# Patient Record
Sex: Female | Born: 1945 | Race: Black or African American | Hispanic: No | Marital: Single | Smoking: Former smoker
Health system: Southern US, Community
[De-identification: ages and names within clinical notes are randomized; demographics above are authoritative.]

## PROBLEM LIST (undated history)

## (undated) DIAGNOSIS — T4145XA Adverse effect of unspecified anesthetic, initial encounter: Secondary | ICD-10-CM

## (undated) DIAGNOSIS — Z9289 Personal history of other medical treatment: Secondary | ICD-10-CM

## (undated) DIAGNOSIS — I700 Atherosclerosis of aorta: Secondary | ICD-10-CM

## (undated) DIAGNOSIS — R0600 Dyspnea, unspecified: Secondary | ICD-10-CM

## (undated) DIAGNOSIS — E049 Nontoxic goiter, unspecified: Secondary | ICD-10-CM

## (undated) DIAGNOSIS — J302 Other seasonal allergic rhinitis: Secondary | ICD-10-CM

## (undated) DIAGNOSIS — I10 Essential (primary) hypertension: Secondary | ICD-10-CM

## (undated) DIAGNOSIS — K759 Inflammatory liver disease, unspecified: Secondary | ICD-10-CM

## (undated) DIAGNOSIS — R112 Nausea with vomiting, unspecified: Secondary | ICD-10-CM

## (undated) DIAGNOSIS — Z860100 Personal history of colon polyps, unspecified: Secondary | ICD-10-CM

## (undated) DIAGNOSIS — M1990 Unspecified osteoarthritis, unspecified site: Secondary | ICD-10-CM

## (undated) DIAGNOSIS — T7840XA Allergy, unspecified, initial encounter: Secondary | ICD-10-CM

## (undated) DIAGNOSIS — E785 Hyperlipidemia, unspecified: Secondary | ICD-10-CM

## (undated) DIAGNOSIS — R011 Cardiac murmur, unspecified: Secondary | ICD-10-CM

## (undated) DIAGNOSIS — K219 Gastro-esophageal reflux disease without esophagitis: Secondary | ICD-10-CM

## (undated) DIAGNOSIS — R002 Palpitations: Secondary | ICD-10-CM

## (undated) DIAGNOSIS — T8859XA Other complications of anesthesia, initial encounter: Secondary | ICD-10-CM

## (undated) DIAGNOSIS — N189 Chronic kidney disease, unspecified: Secondary | ICD-10-CM

## (undated) DIAGNOSIS — Z9889 Other specified postprocedural states: Secondary | ICD-10-CM

## (undated) DIAGNOSIS — K859 Acute pancreatitis without necrosis or infection, unspecified: Secondary | ICD-10-CM

## (undated) DIAGNOSIS — J439 Emphysema, unspecified: Secondary | ICD-10-CM

## (undated) DIAGNOSIS — I4891 Unspecified atrial fibrillation: Secondary | ICD-10-CM

## (undated) DIAGNOSIS — D219 Benign neoplasm of connective and other soft tissue, unspecified: Secondary | ICD-10-CM

## (undated) HISTORY — PX: CARPAL TUNNEL RELEASE: SHX101

## (undated) HISTORY — DX: Gastro-esophageal reflux disease without esophagitis: K21.9

## (undated) HISTORY — DX: Allergy, unspecified, initial encounter: T78.40XA

## (undated) HISTORY — PX: HAND SURGERY: SHX662

## (undated) HISTORY — DX: Personal history of colon polyps, unspecified: Z86.0100

## (undated) HISTORY — PX: TONSILLECTOMY: SHX5217

## (undated) HISTORY — PX: CHOLECYSTECTOMY: SHX55

## (undated) HISTORY — DX: Acute pancreatitis without necrosis or infection, unspecified: K85.90

## (undated) HISTORY — PX: TONSILLECTOMY: SUR1361

## (undated) HISTORY — DX: Hyperlipidemia, unspecified: E78.5

## (undated) HISTORY — PX: COLONOSCOPY W/ POLYPECTOMY: SHX1380

## (undated) HISTORY — DX: Essential (primary) hypertension: I10

## (undated) HISTORY — DX: Personal history of colonic polyps: Z86.010

---

## 1975-06-12 HISTORY — PX: ABDOMINAL HYSTERECTOMY: SHX81

## 2004-08-09 ENCOUNTER — Emergency Department: Payer: Self-pay | Admitting: Unknown Physician Specialty

## 2007-03-07 ENCOUNTER — Encounter: Payer: Self-pay | Admitting: Family Medicine

## 2007-03-26 LAB — HM PAP SMEAR

## 2007-03-26 LAB — CONVERTED CEMR LAB: Pap Smear: NORMAL

## 2008-03-23 ENCOUNTER — Encounter: Payer: Self-pay | Admitting: Family Medicine

## 2009-04-22 ENCOUNTER — Ambulatory Visit: Payer: Self-pay | Admitting: Family Medicine

## 2009-04-22 DIAGNOSIS — E1129 Type 2 diabetes mellitus with other diabetic kidney complication: Secondary | ICD-10-CM | POA: Insufficient documentation

## 2009-04-22 DIAGNOSIS — K219 Gastro-esophageal reflux disease without esophagitis: Secondary | ICD-10-CM | POA: Insufficient documentation

## 2009-04-22 DIAGNOSIS — J309 Allergic rhinitis, unspecified: Secondary | ICD-10-CM | POA: Insufficient documentation

## 2009-04-22 DIAGNOSIS — E785 Hyperlipidemia, unspecified: Secondary | ICD-10-CM | POA: Insufficient documentation

## 2009-04-22 DIAGNOSIS — I10 Essential (primary) hypertension: Secondary | ICD-10-CM | POA: Insufficient documentation

## 2009-04-22 DIAGNOSIS — Z87898 Personal history of other specified conditions: Secondary | ICD-10-CM | POA: Insufficient documentation

## 2009-04-25 LAB — CONVERTED CEMR LAB
ALT: 30 units/L (ref 0–35)
AST: 27 units/L (ref 0–37)
Albumin: 4.1 g/dL (ref 3.5–5.2)
Alkaline Phosphatase: 95 units/L (ref 39–117)
BUN: 15 mg/dL (ref 6–23)
Bilirubin, Direct: 0 mg/dL (ref 0.0–0.3)
CO2: 27 meq/L (ref 19–32)
Calcium: 9.3 mg/dL (ref 8.4–10.5)
Chloride: 105 meq/L (ref 96–112)
Cholesterol: 214 mg/dL — ABNORMAL HIGH (ref 0–200)
Creatinine, Ser: 1.1 mg/dL (ref 0.4–1.2)
Direct LDL: 142.9 mg/dL
GFR calc non Af Amer: 64.83 mL/min (ref >60)
Glucose, Bld: 218 mg/dL — ABNORMAL HIGH (ref 70–99)
HDL: 56.8 mg/dL (ref >39.00)
Hgb A1c MFr Bld: 8.9 % — ABNORMAL HIGH (ref 4.6–6.5)
Potassium: 4.1 meq/L (ref 3.5–5.1)
Sodium: 141 meq/L (ref 135–145)
Total Bilirubin: 0.8 mg/dL (ref 0.3–1.2)
Total CHOL/HDL Ratio: 4
Total Protein: 7.1 g/dL (ref 6.0–8.3)
Triglycerides: 88 mg/dL (ref 0.0–149.0)
VLDL: 17.6 mg/dL (ref 0.0–40.0)

## 2009-06-16 ENCOUNTER — Encounter: Payer: Self-pay | Admitting: Family Medicine

## 2009-07-01 ENCOUNTER — Telehealth: Payer: Self-pay | Admitting: Family Medicine

## 2009-07-02 ENCOUNTER — Ambulatory Visit: Payer: Self-pay | Admitting: Family Medicine

## 2009-07-02 DIAGNOSIS — J019 Acute sinusitis, unspecified: Secondary | ICD-10-CM | POA: Insufficient documentation

## 2009-07-09 ENCOUNTER — Emergency Department (HOSPITAL_COMMUNITY)
Admission: EM | Admit: 2009-07-09 | Discharge: 2009-07-09 | Payer: Self-pay | Source: Home / Self Care | Admitting: Family Medicine

## 2009-07-26 ENCOUNTER — Ambulatory Visit: Payer: Self-pay | Admitting: Family Medicine

## 2009-07-27 LAB — CONVERTED CEMR LAB
Cholesterol: 210 mg/dL — ABNORMAL HIGH (ref 0–200)
Direct LDL: 115.7 mg/dL
HDL: 68.7 mg/dL (ref >39.00)
Hgb A1c MFr Bld: 8.3 % — ABNORMAL HIGH (ref 4.6–6.5)
Total CHOL/HDL Ratio: 3
Triglycerides: 153 mg/dL — ABNORMAL HIGH (ref 0.0–149.0)
VLDL: 30.6 mg/dL (ref 0.0–40.0)

## 2009-07-29 ENCOUNTER — Ambulatory Visit: Payer: Self-pay | Admitting: Family Medicine

## 2009-07-30 ENCOUNTER — Encounter: Payer: Self-pay | Admitting: Family Medicine

## 2009-08-17 ENCOUNTER — Telehealth: Payer: Self-pay | Admitting: Family Medicine

## 2009-09-08 ENCOUNTER — Telehealth: Payer: Self-pay | Admitting: Family Medicine

## 2009-09-13 ENCOUNTER — Ambulatory Visit: Payer: Self-pay | Admitting: Family Medicine

## 2009-09-14 LAB — CONVERTED CEMR LAB: Hgb A1c MFr Bld: 8.1 % — ABNORMAL HIGH (ref 4.6–6.5)

## 2009-09-27 ENCOUNTER — Ambulatory Visit: Payer: Self-pay | Admitting: Family Medicine

## 2009-10-04 ENCOUNTER — Telehealth: Payer: Self-pay | Admitting: Family Medicine

## 2009-11-18 ENCOUNTER — Telehealth: Payer: Self-pay | Admitting: Family Medicine

## 2009-12-15 ENCOUNTER — Ambulatory Visit: Payer: Self-pay | Admitting: Family Medicine

## 2009-12-16 LAB — CONVERTED CEMR LAB: Hgb A1c MFr Bld: 7.8 % — ABNORMAL HIGH (ref 4.6–6.5)

## 2010-02-09 ENCOUNTER — Ambulatory Visit: Payer: Self-pay | Admitting: Family Medicine

## 2010-02-09 DIAGNOSIS — J020 Streptococcal pharyngitis: Secondary | ICD-10-CM | POA: Insufficient documentation

## 2010-02-22 ENCOUNTER — Ambulatory Visit: Payer: Self-pay | Admitting: Family Medicine

## 2010-02-22 DIAGNOSIS — J069 Acute upper respiratory infection, unspecified: Secondary | ICD-10-CM | POA: Insufficient documentation

## 2010-03-16 ENCOUNTER — Ambulatory Visit: Payer: Self-pay | Admitting: Family Medicine

## 2010-03-22 ENCOUNTER — Ambulatory Visit: Payer: Self-pay | Admitting: Family Medicine

## 2010-06-29 ENCOUNTER — Encounter: Payer: Self-pay | Admitting: Family Medicine

## 2010-06-29 ENCOUNTER — Other Ambulatory Visit: Payer: Self-pay | Admitting: Family Medicine

## 2010-06-29 ENCOUNTER — Ambulatory Visit
Admission: RE | Admit: 2010-06-29 | Discharge: 2010-06-29 | Payer: Self-pay | Source: Home / Self Care | Attending: Family Medicine | Admitting: Family Medicine

## 2010-06-29 DIAGNOSIS — R1013 Epigastric pain: Secondary | ICD-10-CM | POA: Insufficient documentation

## 2010-06-29 LAB — BASIC METABOLIC PANEL
BUN: 15 mg/dL (ref 6–23)
CO2: 30 mEq/L (ref 19–32)
Calcium: 9.1 mg/dL (ref 8.4–10.5)
Chloride: 99 mEq/L (ref 96–112)
Creatinine, Ser: 1.3 mg/dL — ABNORMAL HIGH (ref 0.4–1.2)
GFR: 52.78 mL/min — ABNORMAL LOW (ref >60.00)
Glucose, Bld: 238 mg/dL — ABNORMAL HIGH (ref 70–99)
Potassium: 4.7 mEq/L (ref 3.5–5.1)
Sodium: 135 mEq/L (ref 135–145)

## 2010-06-29 LAB — LIPASE: Lipase: 26 U/L (ref 11.0–59.0)

## 2010-06-29 LAB — HEPATIC FUNCTION PANEL
ALT: 31 U/L (ref 0–35)
AST: 27 U/L (ref 0–37)
Albumin: 3.9 g/dL (ref 3.5–5.2)
Alkaline Phosphatase: 100 U/L (ref 39–117)
Bilirubin, Direct: 0.1 mg/dL (ref 0.0–0.3)
Total Bilirubin: 0.4 mg/dL (ref 0.3–1.2)
Total Protein: 6.8 g/dL (ref 6.0–8.3)

## 2010-07-04 ENCOUNTER — Telehealth: Payer: Self-pay | Admitting: Family Medicine

## 2010-07-05 ENCOUNTER — Ambulatory Visit: Admit: 2010-07-05 | Payer: Self-pay | Admitting: Family Medicine

## 2010-07-11 NOTE — Progress Notes (Signed)
Summary: Cough, congestion, sore throat  Phone Note Call from Patient   Caller: Patient Call For: Ruthe Mannan MD Summary of Call: Patient called stating that she is congested, coughing badly, hoarse, sore throat, having hot and cold chills.  Feels like the cold is moving down into her lungs.  Wants to be seen if possible.  Your schedule is booked.  Please advise Initial call taken by: Linde Gillis CMA Duncan Dull),  July 01, 2009 10:41 AM  Follow-up for Phone Call        I honestly cannot handle any more patients today.  It looks as if Dr. Milinda Antis has availablity. If she cannot see her, please schedule in Saturday clinic. Follow-up by: Ruthe Mannan MD,  July 01, 2009 10:59 AM     Appended Document: Cough, congestion, sore throat Patient agrees to be seen in Saturday Clinic tomorrow.  Advised her to call back this afternoon after 2:00 to be scheduled for tomorrow's clinic.  I will call her back to make sure she was able to get appt.    Appended Document: Cough, congestion, sore throat Called to make sure that patient scheduled an appointment for Saturday clinic and she was on the other line speaking with one of our receptionist to schedule the appointment.

## 2010-07-11 NOTE — Assessment & Plan Note (Signed)
Summary: 2WK FOLLOW UP / LFW   Vital Signs:  Patient profile:   65 year old female Height:      63 inches Weight:      131.50 pounds BMI:     23.38 Temp:     98.6 degrees F oral Pulse rate:   64 / minute Pulse rhythm:   regular BP sitting:   122 / 72  (left arm) Cuff size:   regular  Vitals Entered By: Lewanda Rife LPN (September 27, 2009 9:49 AM) CC: two week follow up   History of Present Illness: 65 yo here to for f/u DM.    DM- in February, hga1c was 8.3, this month 8.1.  At that time, had been on Januvia 100 mg daily and Glipeperide 8 mg daily for about a year.  Had also been taking Ultrace prior to every meal.  We then added Byetta 5 micrograms  bid in February.  Since that time, she had been very happy with byetta, however she is now having multiple episodesof hypoglyemia, ranging in the 50s.  She did feel nauseated and diaphoretic with those episodes but has stopped taking the Byetta.  Metformin causes severe GI upset.  CBGs without Byetta still in high 250s/260s.     Current Medications (verified): 1)  Fergon 240 (27 Fe) Mg Tabs (Ferrous Gluconate) .... Take 1 Tablet By Mouth Once A Day 2)  Fexofenadine Hcl 180 Mg Tabs (Fexofenadine Hcl) .... Take 1 Tablet By Mouth Once A Day As Needed 3)  Ranitidine Hcl 300 Mg Caps (Ranitidine Hcl) .... Take 1 Capsule By Mouth Two Times A Day 4)  Lisinopril 5 Mg Tabs (Lisinopril) .... Take 1 Tablet By Mouth Once A Day 5)  Glimepiride 4 Mg Tabs (Glimepiride) .... Take 2 Tablets By Mouth Once Daily 6)  Januvia 100 Mg Tabs (Sitagliptin Phosphate) .... Take 1 Tablet By Mouth Once A Day 7)  Fluticasone Propionate 50 Mcg/act Susp (Fluticasone Propionate) .... Two Sprays Each Nostril Once Daily 8)  Multivitamins   Tabs (Multiple Vitamin) .... Take 1 Tablet By Mouth Once A Day 9)  Calcium Carbonate-Vitamin D 600-400 Mg-Unit  Tabs (Calcium Carbonate-Vitamin D) .... Take 1 Tablet By Mouth Once A Day 10)  Pancreaze 4200 Unit Cpep (Pancrelipase  (Lip-Prot-Amyl)) .Marland Kitchen.. 1 Tab Before Every Meal. 11)  One Touch Test Strp (Glucose Blood) .... Use As Directed 12)  Ulticare Mini Pen Needles 31g X 6 Mm Misc (Insulin Pen Needle) .... Use As Directed. 13)  Lantus Solostar 100 Unit/ml Soln (Insulin Glargine) .... Lantus Pen- 5 Units At Bedtime. Send Overnight.  Allergies: 1)  ! Codeine  Review of Systems      See HPI CV:  Denies chest pain or discomfort. Resp:  Denies shortness of breath.  Physical Exam  General:  Well-developed,well-nourished,in no acute distress; alert,appropriate and cooperative throughout examination Mouth:  MMM Psych:  Oriented X3 and normally interactive.    Diabetes Management Exam:    Foot Exam (with socks and/or shoes not present):       Sensory-Pinprick/Light touch:          Left medial foot (L-4): normal          Left dorsal foot (L-5): normal          Left lateral foot (S-1): normal          Right medial foot (L-4): normal       Sensory-Monofilament:          Left foot: normal  Right foot: normal       Inspection:          Left foot: normal          Right foot: normal       Nails:          Left foot: thickened          Right foot: thickened   Impression & Recommendations:  Problem # 1:  DIABETES MELLITUS, TYPE II (ICD-250.00) Assessment Unchanged Time spent with patient 25 minutes, more than 50% of this time was spent counseling patient on diabetes management. Advised not restarting Byetta. At this point, we really do need to start insulin.  Pt agrees with plan. Given that she had so many episodes of hypoglyecima, we will start slow with 5 units of Lantus at bedtime along with her other meds.  She will call me next week with her CBGs (3 times daily), as we will definitely have to titrate up. a1c in 3 months, appt already scheduled.  The following medications were removed from the medication list:    Byetta 5 Mcg Pen 5 Mcg/0.33ml Soln (Exenatide) .Marland KitchenMarland KitchenMarland KitchenMarland Kitchen 5 micrograms two times a day 60  minutes prior to meal. Her updated medication list for this problem includes:    Lisinopril 5 Mg Tabs (Lisinopril) .Marland Kitchen... Take 1 tablet by mouth once a day    Glimepiride 4 Mg Tabs (Glimepiride) .Marland Kitchen... Take 2 tablets by mouth once daily    Januvia 100 Mg Tabs (Sitagliptin phosphate) .Marland Kitchen... Take 1 tablet by mouth once a day    Lantus Solostar 100 Unit/ml Soln (Insulin glargine) .Marland Kitchen... Lantus pen- 5 units at bedtime. send overnight.  Complete Medication List: 1)  Fergon 240 (27 Fe) Mg Tabs (Ferrous gluconate) .... Take 1 tablet by mouth once a day 2)  Fexofenadine Hcl 180 Mg Tabs (Fexofenadine hcl) .... Take 1 tablet by mouth once a day as needed 3)  Ranitidine Hcl 300 Mg Caps (Ranitidine hcl) .... Take 1 capsule by mouth two times a day 4)  Lisinopril 5 Mg Tabs (Lisinopril) .... Take 1 tablet by mouth once a day 5)  Glimepiride 4 Mg Tabs (Glimepiride) .... Take 2 tablets by mouth once daily 6)  Januvia 100 Mg Tabs (Sitagliptin phosphate) .... Take 1 tablet by mouth once a day 7)  Fluticasone Propionate 50 Mcg/act Susp (Fluticasone propionate) .... Two sprays each nostril once daily 8)  Multivitamins Tabs (Multiple vitamin) .... Take 1 tablet by mouth once a day 9)  Calcium Carbonate-vitamin D 600-400 Mg-unit Tabs (Calcium carbonate-vitamin d) .... Take 1 tablet by mouth once a day 10)  Pancreaze 4200 Unit Cpep (Pancrelipase (lip-prot-amyl)) .Marland Kitchen.. 1 tab before every meal. 11)  One Touch Test Strp (Glucose blood) .... Use as directed 12)  Ulticare Mini Pen Needles 31g X 6 Mm Misc (Insulin pen needle) .... Use as directed. 13)  Lantus Solostar 100 Unit/ml Soln (Insulin glargine) .... Lantus pen- 5 units at bedtime. send overnight.  Patient Instructions: 1)  Stop Byetta. 2)  Start Lantus 5 units every night. 3)  Check sugars 3 times a day, call me next week. Prescriptions: LANTUS SOLOSTAR 100 UNIT/ML SOLN (INSULIN GLARGINE) lantus pen- 5 units at bedtime. Send overnight.  #1610 x 3   Entered and  Authorized by:   Ruthe Mannan MD   Signed by:   Ruthe Mannan MD on 09/27/2009   Method used:   Faxed to ...       [facility] Delivery Fax) Environmental education officer)             ,  Ph: [phone]       Fax: [phone]   RxID:   [id number]   Current Allergies (reviewed today): ! CODEINE

## 2010-07-11 NOTE — Progress Notes (Signed)
Summary: regarding lantus  Phone Note Call from Patient   Caller: Patient Call For: Ruthe Mannan MD Summary of Call: [facility] says [facility] didnt get script for lantus.  She is asking that we resend this. Initial call taken by: Lowella Petties CMA,  October 04, 2009 3:44 PM    Prescriptions: LANTUS SOLOSTAR 100 UNIT/ML SOLN (INSULIN GLARGINE) lantus pen- 5 units at bedtime. Send overnight.  #1610 x 3   Entered and Authorized by:   Ruthe Mannan MD   Signed by:   Ruthe Mannan MD on 10/05/2009   Method used:   Faxed to ...       [facility] Delivery Fax) (mail-order)             ,          Ph: [phone]       Fax: [phone]   RxID:   [id number]

## 2010-07-11 NOTE — Assessment & Plan Note (Signed)
Summary: SORE THROAT   Vital Signs:  Patient profile:   65 year old female Height:      63 inches Weight:      132.25 pounds BMI:     23.51 Temp:     98.4 degrees F oral Pulse rate:   84 / minute Pulse rhythm:   regular BP sitting:   138 / 70  (left arm) Cuff size:   regular  Vitals Entered By: Delilah Shan CMA Duncan Dull) (February 09, 2010 2:04 PM) CC: Sore Throat   History of Present Illness: Ear pain and ST for a few days.  Sweaty, hot, chills.  Voice change noted by patient.  Occ myalgias in arms and legs.  No vomiting.  Able to swallow.  Dec in appetite.  Sister was sick.  Sugar has been controlled.    Allergies: 1)  ! Codeine 2)  ! Penicillin  Review of Systems       See HPI.  Otherwise negative.    Physical Exam  General:  GEN: nad, alert and oriented HEENT: mucous membranes moist, TM w/o erythema, nasal epithelium injected, OP with cobblestoning NECK: supple with shotty LA CV: rrr. PULM: ctab, no inc wob ABD: soft, +bs EXT: no edema    Impression & Recommendations:  Problem # 1:  STREPTOCOCCAL PHARYNGITIS (ICD-034.0) RST pos.  Tx and supportive measures o/w.  D/w patient and she understands.  follow up as needed.  Her updated medication list for this problem includes:    Zithromax 250 Mg Tabs (Azithromycin) .Marland Kitchen... 2 by mouth x1 day then 1 by mouth x4 days.  Complete Medication List: 1)  Fergon 240 (27 Fe) Mg Tabs (Ferrous gluconate) .... Take 1 tablet by mouth once a day 2)  Fexofenadine Hcl 180 Mg Tabs (Fexofenadine hcl) .... Take 1 tablet by mouth once a day as needed 3)  Ranitidine Hcl 300 Mg Caps (Ranitidine hcl) .... Take 1 capsule by mouth two times a day 4)  Lisinopril 5 Mg Tabs (Lisinopril) .... Take 1 tablet by mouth once a day 5)  Glimepiride 4 Mg Tabs (Glimepiride) .... Take 2 tablets by mouth once daily 6)  Januvia 100 Mg Tabs (Sitagliptin phosphate) .... Take 1 tablet by mouth once a day 7)  Fluticasone Propionate 50 Mcg/act Susp (Fluticasone  propionate) .... Two sprays each nostril once daily 8)  Multivitamins Tabs (Multiple vitamin) .... Take 1 tablet by mouth once a day 9)  Calcium Carbonate-vitamin D 600-400 Mg-unit Tabs (Calcium carbonate-vitamin d) .... Take 1 tablet by mouth once a day 10)  Pancreaze 4200 Unit Cpep (Pancrelipase (lip-prot-amyl)) .Marland Kitchen.. 1 tab before every meal. 11)  One Touch Test Strp (Glucose blood) .... Use as directed 12)  Ulticare Mini Pen Needles 31g X 6 Mm Misc (Insulin pen needle) .... Use as directed. 13)  Lantus Solostar 100 Unit/ml Soln (Insulin glargine) .... Lantus pen- 5 units at bedtime. send overnight. 14)  Zithromax 250 Mg Tabs (Azithromycin) .... 2 by mouth x1 day then 1 by mouth x4 days.  Patient Instructions: 1)  Try to rest, gargle with salt water, and take the antibiotics.  Let us know if you aren't feeling better.  Prescriptions: ZITHROMAX 250 MG TABS (AZITHROMYCIN) 2 by mouth x1 day then 1 by mouth x4 days.  #6 x 0   Entered and Authorized by:   Crawford Givens MD   Signed by:   Crawford Givens MD on 02/09/2010   Method used:   Electronically to  [facility] [address] (retail)       [address], [address]       [place], [place]  [address]       Ph: [phone]       Fax: [phone]   RxID:   [id number]   Current Allergies (reviewed today): ! CODEINE ! PENICILLIN  Laboratory Results  Date/Time Received: February 09, 2010 2:18 PM   Other Tests  Rapid Strep: positive

## 2010-07-11 NOTE — Progress Notes (Signed)
Summary: need 90 day script for pancreaze  Phone Note Refill Request Message from:  Patient  Refills Requested: Medication #1:  PANCREAZE 4200 UNIT CPEP 1 tab before every meal. Pt needs 90 day written script [facility].  She will pick this up when ready.  Initial call taken by: Lowella Petties CMA,  September 08, 2009 3:11 PM  Follow-up for Phone Call        on my desk. Follow-up by: Ruthe Mannan MD,  September 08, 2009 3:17 PM    Prescriptions: PANCREAZE 4200 UNIT CPEP (PANCRELIPASE (LIP-PROT-AMYL)) 1 tab before every meal.  #90 x 0   Entered and Authorized by:   Ruthe Mannan MD   Signed by:   Ruthe Mannan MD on 09/08/2009   Method used:   Print then Give to Patient   RxID:   [id number]   Appended Document: need 90 day script for pancreaze Patient Advised.

## 2010-07-11 NOTE — Progress Notes (Signed)
Summary: pt requests phone call  Phone Note Call from Patient Call back at Home Phone [phone] Call back at [phone]   Caller: Patient Call For: Ruthe Mannan MD Summary of Call: Pt says she was told to call and leave a message and you would call her back regarding her blood sugars.  She says these are a little high in the mornings.  She didnt have the numbers ready to give me when she called, but said she would write them down so that she can give them to you. Initial call taken by: Lowella Petties CMA,  November 18, 2009 2:23 PM  Follow-up for Phone Call        Called pt.  CBG running 150s-170s in morning.  Advised to increase Lantus to 10 units nightly. Ruthe Mannan MD  November 18, 2009 3:04 PM

## 2010-07-11 NOTE — Progress Notes (Signed)
Summary: needs new scripts  Phone Note Call from Patient   Caller: Patient Call For: Audrey Mannan MD Summary of Call: Pt states she needs something to replace ultrace, she says this is not FDA approved.  She wants a 30 day script called to [facility] as well as a 90 day script [facility]  She also wants 90 day scripts for 31 guage 6 mm needles and one touch ultra test strips, with directions on how often to check blood sugar.  [facility]. Initial call taken by: Lowella Petties CMA,  August 17, 2009 4:13 PM    New/Updated Medications: PANCREAZE 4200 UNIT CPEP (PANCRELIPASE (LIP-PROT-AMYL)) 1 tab before every meal. ONE TOUCH TEST STRP (GLUCOSE BLOOD) use as directed ULTICARE MINI PEN NEEDLES 31G X 6 MM MISC (INSULIN PEN NEEDLE) Use as directed. Prescriptions: ULTICARE MINI PEN NEEDLES 31G X 6 MM MISC (INSULIN PEN NEEDLE) Use as directed.  #270 x 3   Entered and Authorized by:   Audrey Mannan MD   Signed by:   Audrey Mannan MD on 08/17/2009   Method used:   Faxed to ...       [facility] Delivery Fax) (mail-order)             ,          Ph: [phone]       Fax: [phone]   RxID:   [id number] ONE TOUCH TEST STRP (GLUCOSE BLOOD) use as directed  #270 x 3   Entered and Authorized by:   Audrey Mannan MD   Signed by:   Audrey Mannan MD on 08/17/2009   Method used:   Faxed to ...       [facility] Delivery Fax) (mail-order)             ,          Ph: [phone]       Fax: [phone]   RxID:   [id number] PANCREAZE 4200 UNIT CPEP (PANCRELIPASE (LIP-PROT-AMYL)) 1 tab before every meal.  #30 x 0   Entered and Authorized by:   Audrey Mannan MD   Signed by:   Audrey Mannan MD on 08/17/2009   Method used:   [facility] to        [facility]  [address] (retail)       [address]       [place], [place]  [address]       Ph: [phone] or [phone]       Fax: [phone]   RxID:   [id number]

## 2010-07-11 NOTE — Assessment & Plan Note (Signed)
Summary: ?SINUS INFECTION/CLE   Vital Signs:  Patient profile:   65 year old female Height:      63 inches Weight:      130 pounds BMI:     23.11 Temp:     99.0 degrees F oral Pulse rate:   68 / minute Pulse rhythm:   regular BP sitting:   120 / 60  (left arm) Cuff size:   regular  Vitals Entered By: Linde Gillis CMA Duncan Dull) (February 22, 2010 10:57 AM) CC: cough, congestion   History of Present Illness: 65 yo here for worsening cough and congestion.  Seen by Dr. Para March on 02/09/10, rapid strep positive. Treated with Zpack since she has PCN allergy. Felt a little better, sore throat has resolved, ear pain has resolved but now has a lot of congestion, productive cough, feels feverish. No CP or SOB. No wheezing.  Current Medications (verified): 1)  Fexofenadine Hcl 180 Mg Tabs (Fexofenadine Hcl) .... Take 1 Tablet By Mouth Once A Day As Needed 2)  Ranitidine Hcl 300 Mg Caps (Ranitidine Hcl) .... Take 1 Capsule By Mouth Two Times A Day 3)  Lisinopril 5 Mg Tabs (Lisinopril) .... Take 1 Tablet By Mouth Once A Day 4)  Glimepiride 4 Mg Tabs (Glimepiride) .... Take 2 Tablets By Mouth Once Daily 5)  Januvia 100 Mg Tabs (Sitagliptin Phosphate) .... Take 1 Tablet By Mouth Once A Day 6)  Fluticasone Propionate 50 Mcg/act Susp (Fluticasone Propionate) .... Two Sprays Each Nostril Once Daily 7)  Multivitamins   Tabs (Multiple Vitamin) .... Take 1 Tablet By Mouth Once A Day 8)  Calcium Carbonate-Vitamin D 600-400 Mg-Unit  Tabs (Calcium Carbonate-Vitamin D) .... Take 1 Tablet By Mouth Once A Day 9)  Pancreaze 4200 Unit Cpep (Pancrelipase (Lip-Prot-Amyl)) .Marland Kitchen.. 1 Tab Before Every Meal. 10)  One Touch Test Strp (Glucose Blood) .... Use As Directed 11)  Ulticare Mini Pen Needles 31g X 6 Mm Misc (Insulin Pen Needle) .... Use As Directed. 12)  Lantus Solostar 100 Unit/ml Soln (Insulin Glargine) .... Lantus Pen- 5 Units At Bedtime. Send Overnight. 13)  Azithromycin 250 Mg  Tabs (Azithromycin) .... 2  By  Mouth Today and Then 1 Daily For 4 Days  Allergies: 1)  ! Codeine 2)  ! Penicillin  Past History:  Past Medical History: Last updated: 04/22/2009 Allergic rhinitis Diabetes mellitus, type II GERD Hyperlipidemia Hypertension  Past Surgical History: Last updated: 04/22/2009 Hysterectomy 1976 for fibroids Cholecystectomy 2008 Tonsillectomy 1954  Family History: Last updated: 04/22/2009 Mom - alzheimers, h/o breast CA in 58s, DM Dad- died of massive MI at 32  Social History: Last updated: 04/22/2009 Recently moved here from [place] to take care of mother with Alzheimers.  Has two sisters who live here, not helping care for mom.  Quit smoking 1 year ago with Chantix.  Retired from [employer]. Alcohol use-no Drug use-no Regular exercise-no  Risk Factors: Exercise: no (04/22/2009)  Review of Systems      See HPI General:  Complains of fever. ENT:  Complains of nasal congestion and sinus pressure; denies earache. CV:  Denies chest pain or discomfort. Resp:  Complains of cough and sputum productive.  Physical Exam  General:  GEN: nad, alert and oriented HEENT: mucous membranes moist, TM w/o erythema, nasal epithelium injected, OP with cobblestoning NECK: supple with shotty LA CV: rrr. PULM: ctab, no inc wob ABD: soft, +bs EXT: no edema    Impression & Recommendations:  Problem # 1:  STREPTOCOCCAL PHARYNGITIS (  ICD-034.0) Assessment Improved s/p Zpack, now with URI symptoms,  Not taking anything OTC. The following medications were removed from the medication list:    Zithromax 250 Mg Tabs (Azithromycin) .Marland Kitchen... 2 by mouth x1 day then 1 by mouth x4 days. Her updated medication list for this problem includes:    Azithromycin 250 Mg Tabs (Azithromycin) .Marland Kitchen... 2 by  mouth today and then 1 daily for 4 days  Problem # 2:  URI (ICD-465.9) Assessment: New Will repeat Zpack, supportive care as per pt instructions. Her updated medication  list for this problem includes:    Fexofenadine Hcl 180 Mg Tabs (Fexofenadine hcl) .Marland Kitchen... Take 1 tablet by mouth once a day as needed  Complete Medication List: 1)  Fexofenadine Hcl 180 Mg Tabs (Fexofenadine hcl) .... Take 1 tablet by mouth once a day as needed 2)  Ranitidine Hcl 300 Mg Caps (Ranitidine hcl) .... Take 1 capsule by mouth two times a day 3)  Lisinopril 5 Mg Tabs (Lisinopril) .... Take 1 tablet by mouth once a day 4)  Glimepiride 4 Mg Tabs (Glimepiride) .... Take 2 tablets by mouth once daily 5)  Januvia 100 Mg Tabs (Sitagliptin phosphate) .... Take 1 tablet by mouth once a day 6)  Fluticasone Propionate 50 Mcg/act Susp (Fluticasone propionate) .... Two sprays each nostril once daily 7)  Multivitamins Tabs (Multiple vitamin) .... Take 1 tablet by mouth once a day 8)  Calcium Carbonate-vitamin D 600-400 Mg-unit Tabs (Calcium carbonate-vitamin d) .... Take 1 tablet by mouth once a day 9)  Pancreaze 4200 Unit Cpep (Pancrelipase (lip-prot-amyl)) .Marland Kitchen.. 1 tab before every meal. 10)  One Touch Test Strp (Glucose blood) .... Use as directed 11)  Ulticare Mini Pen Needles 31g X 6 Mm Misc (Insulin pen needle) .... Use as directed. 12)  Lantus Solostar 100 Unit/ml Soln (Insulin glargine) .... Lantus pen- 5 units at bedtime. send overnight. 13)  Azithromycin 250 Mg Tabs (Azithromycin) .... 2 by  mouth today and then 1 daily for 4 days  Patient Instructions: 1)  Take antibiotic as directed.  Drink lots of fluids.  Treat sympotmatically with Mucinex, nasal saline irrigation, and Tylenol/Ibuprofen.  Prescriptions: AZITHROMYCIN 250 MG  TABS (AZITHROMYCIN) 2 by  mouth today and then 1 daily for 4 days  #6 x 0   Entered and Authorized by:   Ruthe Mannan MD   Signed by:   Ruthe Mannan MD on 02/22/2010   Method used:   Electronically to        [facility] (retail)       [address]       [place], [place]  [address]       Ph: [phone]       Fax:  [phone]   RxID:   [id number]   Current Allergies (reviewed today): ! CODEINE ! PENICILLIN

## 2010-07-11 NOTE — Assessment & Plan Note (Signed)
Summary: f/u after labs/alc   Vital Signs:  Patient profile:   65 year old female Height:      63 inches Weight:      134 pounds BMI:     23.82 Temp:     98.4 degrees F oral Pulse rate:   72 / minute Pulse rhythm:   regular BP sitting:   130 / 72  (left arm) Cuff size:   regular  Vitals Entered By: Linde Gillis CMA [facility]) (March 22, 2010 9:14 AM) CC: follow-up visit after labs   History of Present Illness: 65 yo here for follow up DM.   DM- in February, hga1c was 8.3, now persistently 7.8.  Admits to not checking CBGs regularly or taking her medication regularly since her mom has been in and out of the hopsital and Ms. Marticia is her primary caregiver.   Does not feel like her sugars have been too low. Currently on Glimepiride 4 mg two times a day, Januvia 100 mg daily, Lantus 5 units at bedtime.    Throat feels better, last seen and treated for strep pharyngitis.  Current Medications (verified): 1)  Fexofenadine Hcl 180 Mg Tabs (Fexofenadine Hcl) .... Take 1 Tablet By Mouth Once A Day As Needed 2)  Ranitidine Hcl 300 Mg Caps (Ranitidine Hcl) .... Take 1 Capsule By Mouth Two Times A Day 3)  Lisinopril 5 Mg Tabs (Lisinopril) .... Take 1 Tablet By Mouth Once A Day 4)  Glimepiride 4 Mg Tabs (Glimepiride) .... Take 2 Tablets By Mouth Once Daily 5)  Januvia 100 Mg Tabs (Sitagliptin Phosphate) .... Take 1 Tablet By Mouth Once A Day 6)  Fluticasone Propionate 50 Mcg/act Susp (Fluticasone Propionate) .... Two Sprays Each Nostril Once Daily 7)  Multivitamins   Tabs (Multiple Vitamin) .... Take 1 Tablet By Mouth Once A Day 8)  Calcium Carbonate-Vitamin D 600-400 Mg-Unit  Tabs (Calcium Carbonate-Vitamin D) .... Take 1 Tablet By Mouth Once A Day 9)  Pancreaze 4200 Unit Cpep (Pancrelipase (Lip-Prot-Amyl)) .Marland Kitchen.. 1 Tab Before Every Meal. 10)  One Touch Test Strp (Glucose Blood) .... Use As Directed 11)  Ulticare Mini Pen Needles 31g X 6 Mm Misc (Insulin Pen Needle) .... Use As Directed. 12)   Lantus Solostar 100 Unit/ml Soln (Insulin Glargine) .... Lantus Pen- 5 Units At Bedtime. Send Overnight. 13)  Ultiguard Needle Dispenser  Allergies: 1)  ! Codeine 2)  ! Penicillin  Past History:  Past Medical History: Last updated: 04/22/2009 Allergic rhinitis Diabetes mellitus, type II GERD Hyperlipidemia Hypertension  Past Surgical History: Last updated: 04/22/2009 Hysterectomy 1976 for fibroids Cholecystectomy 2008 Tonsillectomy 1954  Family History: Last updated: 04/22/2009 Mom - alzheimers, h/o breast CA in 30s, DM Dad- died of massive MI at 61  Social History: Last updated: 04/22/2009 Recently moved here from [place] to take care of mother with Alzheimers.  Has two sisters who live here, not helping care for mom.  Quit smoking 1 year ago with Chantix.  Retired from [employer]. Alcohol use-no Drug use-no Regular exercise-no  Risk Factors: Exercise: no (04/22/2009)  Review of Systems      See HPI GI:  Denies abdominal pain, nausea, and vomiting.  Physical Exam  General:   nad, alert and oriented  Psych:  Oriented X3 and normally interactive.     Impression & Recommendations:  Problem # 1:  DIABETES MELLITUS, TYPE II (ICD-250.00) Assessment Unchanged Still not at goal but admits non compliance. Time spent with patient 25 minutes, more than 50%  of this time was spent counseling patient on diabetes and importance of placing her health first.  Gave her names of good home health services and respite care. Pt to follow up in 3 months.  Her updated medication list for this problem includes:    Lisinopril 5 Mg Tabs (Lisinopril) .Marland Kitchen... Take 1 tablet by mouth once a day    Glimepiride 4 Mg Tabs (Glimepiride) .Marland Kitchen... Take 2 tablets by mouth once daily    Januvia 100 Mg Tabs (Sitagliptin phosphate) .Marland Kitchen... Take 1 tablet by mouth once a day    Lantus Solostar 100 Unit/ml Soln (Insulin glargine) .Marland Kitchen... Lantus pen- 5 units at bedtime. send  overnight.  Complete Medication List: 1)  Fexofenadine Hcl 180 Mg Tabs (Fexofenadine hcl) .... Take 1 tablet by mouth once a day as needed 2)  Ranitidine Hcl 300 Mg Caps (Ranitidine hcl) .... Take 1 capsule by mouth two times a day 3)  Lisinopril 5 Mg Tabs (Lisinopril) .... Take 1 tablet by mouth once a day 4)  Glimepiride 4 Mg Tabs (Glimepiride) .... Take 2 tablets by mouth once daily 5)  Januvia 100 Mg Tabs (Sitagliptin phosphate) .... Take 1 tablet by mouth once a day 6)  Fluticasone Propionate 50 Mcg/act Susp (Fluticasone propionate) .... Two sprays each nostril once daily 7)  Multivitamins Tabs (Multiple vitamin) .... Take 1 tablet by mouth once a day 8)  Calcium Carbonate-vitamin D 600-400 Mg-unit Tabs (Calcium carbonate-vitamin d) .... Take 1 tablet by mouth once a day 9)  Pancreaze 4200 Unit Cpep (Pancrelipase (lip-prot-amyl)) .Marland Kitchen.. 1 tab before every meal. 10)  One Touch Test Strp (Glucose blood) .... Use as directed 11)  Ulticare Mini Pen Needles 31g X 6 Mm Misc (Insulin pen needle) .... Use as directed. 12)  Lantus Solostar 100 Unit/ml Soln (Insulin glargine) .... Lantus pen- 5 units at bedtime. send overnight. 13)  Ultiguard Needle Dispenser   Other Orders: Admin 1st Vaccine (16109) Flu Vaccine 37yrs + (60454) Prescriptions: FLUTICASONE PROPIONATE 50 MCG/ACT SUSP (FLUTICASONE PROPIONATE) Two sprays each nostril once daily  #3 x 3   Entered and Authorized by:   Ruthe Mannan MD   Signed by:   Ruthe Mannan MD on 03/22/2010   Method used:   Faxed to ...       [facility] Fax) (mail-order)             ,          Ph: [phone]       Fax: [phone]   RxID:   [id number] GLIMEPIRIDE 4 MG TABS (GLIMEPIRIDE) Take 2 tablets by mouth once daily  #180 x 3   Entered and Authorized by:   Ruthe Mannan MD   Signed by:   Ruthe Mannan MD on 03/22/2010   Method used:   Faxed to ...       [facility] Fax) (mail-order)             ,          Ph:  [phone]       Fax: [phone]   RxID:   [id number] LISINOPRIL 5 MG TABS (LISINOPRIL) Take 1 tablet by mouth once a day  #90 x 3   Entered and Authorized by:   Ruthe Mannan MD   Signed by:   Ruthe Mannan MD on 03/22/2010   Method used:   Faxed to ...       [facility] Fax) Environmental education officer)             ,  Ph: [phone]       Fax: [phone]   RxID:   [id number]   Current Allergies (reviewed today): ! CODEINE ! PENICILLIN  Flu Vaccine Consent Questions     Do you have a history of severe allergic reactions to this vaccine? no    Any prior history of allergic reactions to egg and/or gelatin? no    Do you have a sensitivity to the preservative Thimersol? no    Do you have a past history of Guillan-Barre Syndrome? no    Do you currently have an acute febrile illness? no    Have you ever had a severe reaction to latex? no    Vaccine information given and explained to patient? yes    Are you currently pregnant? no    Lot Number:[id number]   Exp Date:12/09/2010   Site Given  Left Deltoid IMbflu

## 2010-07-11 NOTE — Assessment & Plan Note (Signed)
Summary: cough, feeling sick   Vital Signs:  Patient profile:   65 year old female Weight:      123 pounds O2 Sat:      98 % on Room air Temp:     98.6 degrees F oral Pulse rate:   72 / minute Pulse rhythm:   regular BP sitting:   140 / 80  (left arm) Cuff size:   regular  Vitals Entered By: Mervin Hack CMA (AAMA) (July 02, 2009 11:30 AM)  O2 Flow:  Room air CC: cold, URI symptoms   History of Present Illness:       This is a 65 year old woman who presents with URI symptoms.  The symptoms began 5 days ago.  The patient complains of nasal congestion, purulent nasal discharge, sore throat, productive cough, and earache.  The patient denies fever, low-grade fever (<100.5 degrees), fever of 100.5-103 degrees, fever of 103.1-104 degrees, fever to >104 degrees, stiff neck, dyspnea, wheezing, rash, vomiting, diarrhea, use of an antipyretic, and response to antipyretic.  The patient also reports headache.  The patient denies itchy watery eyes, itchy throat, sneezing, seasonal symptoms, response to antihistamine, muscle aches, and severe fatigue.  The patient denies the following risk factors for Strep sinusitis: unilateral facial pain, unilateral nasal discharge, poor response to decongestant, double sickening, tooth pain, Strep exposure, tender adenopathy, and absence of cough.  Current Medications (verified): 1)  Ultrase Mt 12 39-12-39 Mu Cpep (Amylase-Lipase-Protease) .... Take 3 Tablets By Mouth Before Every Meal. 2)  Fergon 240 (27 Fe) Mg Tabs (Ferrous Gluconate) .... Take 1 Tablet By Mouth Once A Day 3)  Fexofenadine Hcl 180 Mg Tabs (Fexofenadine Hcl) .... Take 1 Tablet By Mouth Once A Day As Needed 4)  Ranitidine Hcl 300 Mg Caps (Ranitidine Hcl) .... Take 1 Capsule By Mouth Two Times A Day 5)  Lisinopril 5 Mg Tabs (Lisinopril) .... Take 1 Tablet By Mouth Once A Day 6)  Glimepiride 4 Mg Tabs (Glimepiride) .... Take 2 Tablets By Mouth Once Daily 7)  Januvia 100 Mg Tabs (Sitagliptin  Phosphate) .... Take 1 Tablet By Mouth Once A Day 8)  Fluticasone Propionate 50 Mcg/act Susp (Fluticasone Propionate) .... Two Sprays Each Nostril Once Daily 9)  Multivitamins   Tabs (Multiple Vitamin) .... Take 1 Tablet By Mouth Once A Day 10)  Calcium Carbonate-Vitamin D 600-400 Mg-Unit  Tabs (Calcium Carbonate-Vitamin D) .... Take 1 Tablet By Mouth Once A Day 11)  Augmentin 875-125 Mg Tabs (Amoxicillin-Pot Clavulanate) .Marland Kitchen.. 1 By Mouth Two Times A Day  Allergies: 1)  ! Codeine  Past History:  Past medical, surgical, family and social histories (including risk factors) reviewed for relevance to current acute and chronic problems.  Past Medical History: Reviewed history from 04/22/2009 and no changes required. Allergic rhinitis Diabetes mellitus, type II GERD Hyperlipidemia Hypertension  Past Surgical History: Reviewed history from 04/22/2009 and no changes required. Hysterectomy 1976 for fibroids Cholecystectomy 2008 Tonsillectomy 1954  Family History: Reviewed history from 04/22/2009 and no changes required. Mom - alzheimers, h/o breast CA in 59s, DM Dad- died of massive MI at 67  Social History: Reviewed history from 04/22/2009 and no changes required. Recently moved here from [place] to take care of mother with Alzheimers.  Has two sisters who live here, not helping care for mom.  Quit smoking 1 year ago with Chantix.  Retired from [employer]. Alcohol use-no Drug use-no Regular exercise-no  Review of Systems      See  HPI  Physical Exam  General:  Well-developed,well-nourished,in no acute distress; alert,appropriate and cooperative throughout examination Ears:  External ear exam shows no significant lesions or deformities.  Otoscopic examination reveals clear canals, tympanic membranes are intact bilaterally without bulging, retraction, inflammation or discharge. Hearing is grossly normal bilaterally. Nose:  L frontal sinus tenderness, L  maxillary sinus tenderness, R frontal sinus tenderness, and R maxillary sinus tenderness.   Mouth:  Oral mucosa and oropharynx without lesions or exudates.  Teeth in good repair. Neck:  No deformities, masses, or tenderness noted. Lungs:  Normal respiratory effort, chest expands symmetrically. Lungs are clear to auscultation, no crackles or wheezes. Heart:  normal rate and no murmur.   Skin:  Intact without suspicious lesions or rashes Cervical Nodes:  No lymphadenopathy noted Psych:  Oriented X3 and normally interactive.     Impression & Recommendations:  Problem # 1:  SINUSITIS - ACUTE-NOS (ICD-461.9)  Her updated medication list for this problem includes:    Fluticasone Propionate 50 Mcg/act Susp (Fluticasone propionate) .Marland Kitchen..Marland Kitchen Two sprays each nostril once daily    Augmentin 875-125 Mg Tabs (Amoxicillin-pot clavulanate) .Marland Kitchen... 1 by mouth two times a day  Instructed on treatment. Call if symptoms persist or worsen.   Complete Medication List: 1)  Ultrase Mt 12 39-12-39 Mu Cpep (Amylase-lipase-protease) .... Take 3 tablets by mouth before every meal. 2)  Fergon 240 (27 Fe) Mg Tabs (Ferrous gluconate) .... Take 1 tablet by mouth once a day 3)  Fexofenadine Hcl 180 Mg Tabs (Fexofenadine hcl) .... Take 1 tablet by mouth once a day as needed 4)  Ranitidine Hcl 300 Mg Caps (Ranitidine hcl) .... Take 1 capsule by mouth two times a day 5)  Lisinopril 5 Mg Tabs (Lisinopril) .... Take 1 tablet by mouth once a day 6)  Glimepiride 4 Mg Tabs (Glimepiride) .... Take 2 tablets by mouth once daily 7)  Januvia 100 Mg Tabs (Sitagliptin phosphate) .... Take 1 tablet by mouth once a day 8)  Fluticasone Propionate 50 Mcg/act Susp (Fluticasone propionate) .... Two sprays each nostril once daily 9)  Multivitamins Tabs (Multiple vitamin) .... Take 1 tablet by mouth once a day 10)  Calcium Carbonate-vitamin D 600-400 Mg-unit Tabs (Calcium carbonate-vitamin d) .... Take 1 tablet by mouth once a day 11)   Augmentin 875-125 Mg Tabs (Amoxicillin-pot clavulanate) .Marland Kitchen.. 1 by mouth two times a day Prescriptions: AUGMENTIN 875-125 MG TABS (AMOXICILLIN-POT CLAVULANATE) 1 by mouth two times a day  #20 x 0   Entered and Authorized by:   Loreen Freud DO   Signed by:   Loreen Freud DO on 07/02/2009   Method used:   Electronically to        [facility] (retail)       [address], [address]       [place], [place]  [address]       Ph: [phone]       Fax: [phone]   RxID:   [id number]   Current Allergies (reviewed today): ! CODEINE

## 2010-07-11 NOTE — Letter (Signed)
Summary: [facility]   Imported By: Maryln Gottron 07/11/2009 15:58:36  _____________________________________________________________________  External Attachment:    Type:   Image     Comment:   External Document

## 2010-07-11 NOTE — Assessment & Plan Note (Signed)
Summary: ? concerning DM meds /lsf   Vital Signs:  Patient profile:   65 year old female Height:      63 inches Weight:      130.50 pounds BMI:     23.20 Temp:     98.3 degrees F oral Pulse rate:   88 / minute Pulse rhythm:   regular BP sitting:   114 / 70  (left arm) Cuff size:   regular  Vitals Entered By: Delilah Shan CMA [facility]ull) (September 13, 2009 9:48 AM) CC: ? concerning DM meds   History of Present Illness: 65 yo here to for f/u DM.    DM- in February, hga1c was 8.3.  At that time, had been on Januvia 100 mg daily and Glipeperide 8 mg daily for about a year.  .  Had also been taking Ultrace prior to every meal.  We then added Byetta 5 micrograms  bid in February.  Since that time, she is very happy with byetta, has seen a huge improvement in her CBGs, now rarely getting readings over 130s.  However, she has had a couple of episodes of hypoglyemia, ranging in the 50s.  She did feel nauseated and diaphoretic with those episodes.       Current Medications (verified): 1)  Fergon 240 (27 Fe) Mg Tabs (Ferrous Gluconate) .... Take 1 Tablet By Mouth Once A Day 2)  Fexofenadine Hcl 180 Mg Tabs (Fexofenadine Hcl) .... Take 1 Tablet By Mouth Once A Day As Needed 3)  Ranitidine Hcl 300 Mg Caps (Ranitidine Hcl) .... Take 1 Capsule By Mouth Two Times A Day 4)  Lisinopril 5 Mg Tabs (Lisinopril) .... Take 1 Tablet By Mouth Once A Day 5)  Glimepiride 4 Mg Tabs (Glimepiride) .... Take 2 Tablets By Mouth Once Daily 6)  Januvia 100 Mg Tabs (Sitagliptin Phosphate) .... Take 1 Tablet By Mouth Once A Day 7)  Fluticasone Propionate 50 Mcg/act Susp (Fluticasone Propionate) .... Two Sprays Each Nostril Once Daily 8)  Multivitamins   Tabs (Multiple Vitamin) .... Take 1 Tablet By Mouth Once A Day 9)  Calcium Carbonate-Vitamin D 600-400 Mg-Unit  Tabs (Calcium Carbonate-Vitamin D) .... Take 1 Tablet By Mouth Once A Day 10)  Byetta 5 Mcg Pen 5 Mcg/0.34ml Soln (Exenatide) .... 5 Micrograms Two Times A Day  60 Minutes Prior To Meal. 11)  Pancreaze 4200 Unit Cpep (Pancrelipase (Lip-Prot-Amyl)) .Marland Kitchen.. 1 Tab Before Every Meal. 12)  One Touch Test Strp (Glucose Blood) .... Use As Directed 13)  Ulticare Mini Pen Needles 31g X 6 Mm Misc (Insulin Pen Needle) .... Use As Directed.  Allergies: 1)  ! Codeine  Review of Systems      See HPI Eyes:  Denies blurring. GI:  Complains of nausea; denies vomiting.  Physical Exam  General:  Well-developed,well-nourished,in no acute distress; alert,appropriate and cooperative throughout examination Psych:  Oriented X3 and normally interactive.     Impression & Recommendations:  Problem # 1:  DIABETES MELLITUS, TYPE II (ICD-250.00) Assessment Unchanged Time spent with patient 25 minutes, more than 50% of this time was spent counseling patient on treatment options for her diabetes. She truly likes the Byetta and would prefer to stop another one of her meds instead of stopping the Byetta. We will recheck an a1c today and change our plans accordingly. At this point, I want her to hold the Glimeperide and cut back the Byetta to once a day (although not typical dosing for Byetta). Pt in agreement with plan.  Will  also follow up with me in 2 weeks.  Her updated medication list for this problem includes:    Lisinopril 5 Mg Tabs (Lisinopril) .Marland Kitchen... Take 1 tablet by mouth once a day    Glimepiride 4 Mg Tabs (Glimepiride) .Marland Kitchen... Take 2 tablets by mouth once daily    Januvia 100 Mg Tabs (Sitagliptin phosphate) .Marland Kitchen... Take 1 tablet by mouth once a day    Byetta 5 Mcg Pen 5 Mcg/0.68ml Soln (Exenatide) .Marland KitchenMarland KitchenMarland KitchenMarland Kitchen 5 micrograms two times a day 60 minutes prior to meal.  Orders: Fingerstick (16109)  Complete Medication List: 1)  Fergon 240 (27 Fe) Mg Tabs (Ferrous gluconate) .... Take 1 tablet by mouth once a day 2)  Fexofenadine Hcl 180 Mg Tabs (Fexofenadine hcl) .... Take 1 tablet by mouth once a day as needed 3)  Ranitidine Hcl 300 Mg Caps (Ranitidine hcl) .... Take 1  capsule by mouth two times a day 4)  Lisinopril 5 Mg Tabs (Lisinopril) .... Take 1 tablet by mouth once a day 5)  Glimepiride 4 Mg Tabs (Glimepiride) .... Take 2 tablets by mouth once daily 6)  Januvia 100 Mg Tabs (Sitagliptin phosphate) .... Take 1 tablet by mouth once a day 7)  Fluticasone Propionate 50 Mcg/act Susp (Fluticasone propionate) .... Two sprays each nostril once daily 8)  Multivitamins Tabs (Multiple vitamin) .... Take 1 tablet by mouth once a day 9)  Calcium Carbonate-vitamin D 600-400 Mg-unit Tabs (Calcium carbonate-vitamin d) .... Take 1 tablet by mouth once a day 10)  Byetta 5 Mcg Pen 5 Mcg/0.48ml Soln (Exenatide) .... 5 micrograms two times a day 60 minutes prior to meal. 11)  Pancreaze 4200 Unit Cpep (Pancrelipase (lip-prot-amyl)) .Marland Kitchen.. 1 tab before every meal. 12)  One Touch Test Strp (Glucose blood) .... Use as directed 13)  Ulticare Mini Pen Needles 31g X 6 Mm Misc (Insulin pen needle) .... Use as directed.  Other Orders: TLB-A1C / Hgb A1C (Glycohemoglobin) (83036-A1C)  Patient Instructions: 1)  Great to see you, Ms. Sako. 2)  Let hold your Glimepride for next couple of days. 3)  Also, try taking your Byetta once a day. 4)  Either come in to see me or call me in 2 weeks.  Current Allergies (reviewed today): ! CODEINE

## 2010-07-11 NOTE — Assessment & Plan Note (Signed)
Summary: Follow up per TMA Salley Scarlet   Vital Signs:  Patient profile:   65 year old female Weight:      129.25 pounds BMI:     22.98 Temp:     98.5 degrees F oral Pulse rate:   68 / minute Pulse rhythm:   regular BP sitting:   130 / 70  (left arm) Cuff size:   regular  Vitals Entered By: Delilah Shan CMA Duncan Dull) (July 29, 2009 12:03 PM) CC: Follow-up visit per Dr. Dayton Martes   History of Present Illness: 65 yo here to for f/u DM.    DM- has been on Januvia 100 mg daily and Glipeperide for about a year.  Checks her CBGs once daily, usually not fast, ranges in 150s.  Has also been taking Ultrace prior to every meal.  Denies any episodes of hypo or hyper glycemia.   No blurred vision, tingling in extremeties or chest pain.  She is on an ACEI. Hga1c 8.3 on 07/26/09 (down from 8.9 in 11/10) Admits to not following diet, she is taking meds.  Does not want to start inuslin yet.  LDL mildly elevated at 115, cholesterol otherwise normal.  HTN- Controlled with Lisinopril 5 mg daily.  No chest pain, headache, blurred vision, DOE.     Current Medications (verified): 1)  Ultrase Mt 12 39-12-39 Mu Cpep (Amylase-Lipase-Protease) .... Take 3 Tablets By Mouth Before Every Meal. 2)  Fergon 240 (27 Fe) Mg Tabs (Ferrous Gluconate) .... Take 1 Tablet By Mouth Once A Day 3)  Fexofenadine Hcl 180 Mg Tabs (Fexofenadine Hcl) .... Take 1 Tablet By Mouth Once A Day As Needed 4)  Ranitidine Hcl 300 Mg Caps (Ranitidine Hcl) .... Take 1 Capsule By Mouth Two Times A Day 5)  Lisinopril 5 Mg Tabs (Lisinopril) .... Take 1 Tablet By Mouth Once A Day 6)  Glimepiride 4 Mg Tabs (Glimepiride) .... Take 2 Tablets By Mouth Once Daily 7)  Januvia 100 Mg Tabs (Sitagliptin Phosphate) .... Take 1 Tablet By Mouth Once A Day 8)  Fluticasone Propionate 50 Mcg/act Susp (Fluticasone Propionate) .... Two Sprays Each Nostril Once Daily 9)  Multivitamins   Tabs (Multiple Vitamin) .... Take 1 Tablet By Mouth Once A Day 10)  Calcium  Carbonate-Vitamin D 600-400 Mg-Unit  Tabs (Calcium Carbonate-Vitamin D) .... Take 1 Tablet By Mouth Once A Day 11)  Byetta 5 Mcg Pen 5 Mcg/0.44ml Soln (Exenatide) .... 5 Micrograms Two Times A Day 60 Minutes Prior To Meal.  Allergies: 1)  ! Codeine  Review of Systems      See HPI Eyes:  Denies blurring. CV:  Denies chest pain or discomfort. Resp:  Denies shortness of breath. GI:  Denies abdominal pain, diarrhea, nausea, and vomiting. Endo:  Denies excessive thirst, excessive urination, and polyuria.  Physical Exam  General:  Well-developed,well-nourished,in no acute distress; alert,appropriate and cooperative throughout examination Eyes:  No corneal or conjunctival inflammation noted. EOMI. Perrla. Funduscopic exam benign, without hemorrhages, exudates or papilledema. Vision grossly normal. Mouth:  Oral mucosa and oropharynx without lesions or exudates.  Teeth in good repair. Lungs:  Normal respiratory effort, chest expands symmetrically. Lungs are clear to auscultation, no crackles or wheezes. Heart:  normal rate and no murmur.   Abdomen:  Bowel sounds positive,abdomen soft and non-tender without masses, organomegaly or hernias noted. Extremities:  No clubbing, cyanosis, edema, or deformity noted with normal full range of motion of all joints.   Psych:  Oriented X3 and normally interactive.     Impression &  Recommendations:  Problem # 1:  DIABETES MELLITUS, TYPE II (ICD-250.00) Assessment Unchanged Still poorly controlled. Time spent with patient 25 minutes, more than 50% of this time was spent counseling patient on options for diabetes managment.  Afraid to try insulin but ok with trying Byetta.  Could not tolerate Metformin in past.   Given handout and discussed how to use Byetta.  She will keep a CBG log for me and f/u in 3 months. Her updated medication list for this problem includes:    Lisinopril 5 Mg Tabs (Lisinopril) .Marland Kitchen... Take 1 tablet by mouth once a day    Glimepiride 4  Mg Tabs (Glimepiride) .Marland Kitchen... Take 2 tablets by mouth once daily    Januvia 100 Mg Tabs (Sitagliptin phosphate) .Marland Kitchen... Take 1 tablet by mouth once a day    Byetta 5 Mcg Pen 5 Mcg/0.78ml Soln (Exenatide) .Marland KitchenMarland KitchenMarland KitchenMarland Kitchen 5 micrograms two times a day 60 minutes prior to meal.  Complete Medication List: 1)  Ultrase Mt 12 39-12-39 Mu Cpep (Amylase-lipase-protease) .... Take 3 tablets by mouth before every meal. 2)  Fergon 240 (27 Fe) Mg Tabs (Ferrous gluconate) .... Take 1 tablet by mouth once a day 3)  Fexofenadine Hcl 180 Mg Tabs (Fexofenadine hcl) .... Take 1 tablet by mouth once a day as needed 4)  Ranitidine Hcl 300 Mg Caps (Ranitidine hcl) .... Take 1 capsule by mouth two times a day 5)  Lisinopril 5 Mg Tabs (Lisinopril) .... Take 1 tablet by mouth once a day 6)  Glimepiride 4 Mg Tabs (Glimepiride) .... Take 2 tablets by mouth once daily 7)  Januvia 100 Mg Tabs (Sitagliptin phosphate) .... Take 1 tablet by mouth once a day 8)  Fluticasone Propionate 50 Mcg/act Susp (Fluticasone propionate) .... Two sprays each nostril once daily 9)  Multivitamins Tabs (Multiple vitamin) .... Take 1 tablet by mouth once a day 10)  Calcium Carbonate-vitamin D 600-400 Mg-unit Tabs (Calcium carbonate-vitamin d) .... Take 1 tablet by mouth once a day 11)  Byetta 5 Mcg Pen 5 Mcg/0.81ml Soln (Exenatide) .... 5 micrograms two times a day 60 minutes prior to meal.  Patient Instructions: 1)  Please come back to see me in 3 months- we will recheck cholesterl and a1c at that time. 2)  I called Byetta into your pharmacy. 3)  Please read handout and call me if you have any questions. 4)  Keep a log of your blood sugars for me. Prescriptions: BYETTA 5 MCG PEN 5 MCG/0.02ML SOLN (EXENATIDE) 5 micrograms two times a day 60 minutes prior to meal.  #1 x 1   Entered and Authorized by:   Ruthe Mannan MD   Signed by:   Ruthe Mannan MD on 07/29/2009   Method used:   Faxed to ...       [facility] Fax) (mail-order)              ,          Ph: [phone]       Fax: [phone]   RxID:   [id number]   Current Allergies (reviewed today): ! CODEINE

## 2010-07-13 NOTE — Progress Notes (Signed)
Summary: 90 day supply, [facility]  Phone Note Refill Request Message from:  Patient on July 04, 2010 1:40 PM  Refills Requested: Medication #1:  FEXOFENADINE HCL 180 MG TABS Take 1 tablet by mouth once a day as needed  Medication #2:  RANITIDINE HCL 300 MG CAPS Take 1 capsule by mouth two times a day  Medication #3:  LISINOPRIL 5 MG TABS Take 1 tablet by mouth once a day  Medication #4:  GLIMEPIRIDE 4 MG TABS Take 2 tablets by mouth once daily Also needs [place]. Patient needs written scripts for all of these [facility] not [facility] and she is out of medication.   Initial call taken by: Melody Comas,  July 04, 2010 1:42 PM  Follow-up for Phone Call        [place], ok to refill all of these, 3 month supply plus 3 refills. Ruthe Mannan MD  July 05, 2010 7:37 AM  All medications were sent to [facility], #90 with 3 refills.  Patient advised via telephone.  Follow-up by: Linde Gillis CMA Duncan Dull),  July 05, 2010 8:12 AM    Prescriptions: JANUVIA 100 MG TABS (SITAGLIPTIN PHOSPHATE) Take 1 tablet by mouth once a day  #90 x 3   Entered by:   Linde Gillis CMA (AAMA)   Authorized by:   Ruthe Mannan MD   Signed by:   Linde Gillis CMA (AAMA) on 07/05/2010   Method used:   Electronically to        [facility]* (mail-order)       [address]       [place], [place]  [address]       Ph: [phone]       Fax: [phone]   RxID:   [id number] GLIMEPIRIDE 4 MG TABS (GLIMEPIRIDE) Take 2 tablets by mouth once daily  #180 x 3   Entered by:   Linde Gillis CMA (AAMA)   Authorized by:   Ruthe Mannan MD   Signed by:   Linde Gillis CMA (AAMA) on 07/05/2010   Method used:   Electronically to        [facility]* (mail-order)       [address]       [place], [place]  [address]       Ph: [phone]       Fax: [phone]   RxID:   [id number] LISINOPRIL 5 MG  TABS (LISINOPRIL) Take 1 tablet by mouth once a day  #90 x 3   Entered by:   Linde Gillis CMA (AAMA)   Authorized by:   Ruthe Mannan MD   Signed by:   Linde Gillis CMA (AAMA) on 07/05/2010   Method used:   Electronically to        [facility]* (mail-order)       [address]       [place], [place]  [address]       Ph: [phone]       Fax: [phone]   RxID:   [id number] RANITIDINE HCL 300 MG CAPS (RANITIDINE HCL) Take 1 capsule by mouth two times a day  #180 x 3   Entered by:   Linde Gillis CMA (AAMA)   Authorized by:   Ruthe Mannan MD   Signed by:   Linde Gillis CMA (AAMA) on 07/05/2010   Method used:   [facility]       [address]       [place], [place]  [address]       Ph: [phone]       Fax: [phone]   RxID:   [id number] FEXOFENADINE HCL 180 MG TABS (FEXOFENADINE HCL) Take 1 tablet by mouth once a day as needed  #90 x 3   Entered by:   Linde Gillis CMA (AAMA)   Authorized by:   Ruthe Mannan MD   Signed by:   Linde Gillis CMA (AAMA) on 07/05/2010   Method used:   Electronically to        [facility]* (mail-order)       [address]       [place], [place]  [address]       Ph: [phone]       Fax: [phone]   RxID:   [id number]

## 2010-07-13 NOTE — Assessment & Plan Note (Signed)
Summary: PAIN ABOVE RIGHT BREAST/ lb   Vital Signs:  Patient profile:   65 year old female Height:      63 inches Weight:      133 pounds BMI:     23.65 Temp:     98.5 degrees F oral Pulse rate:   68 / minute Pulse rhythm:   regular BP sitting:   120 / 70  (left arm) Cuff size:   regular  Vitals Entered By: Linde Gillis CMA Duncan Dull) (June 29, 2010 11:27 AM) CC: pain above right breast   History of Present Illness: 65 yo DM , ho tobacco abuse, here with pain above her right breast that started a few days ago.  Does not radiate anywhere.  Worsened by deep breaths. Pain improved with belching. No abdominal pain, nausea, vomting or diaphoresis. No worsened with exertion. No CP, LE edema or dizziness.  Exacerbated a little by eating.  Has had some flatulence as well. Did eat something with peanuts prior to this starting which has caused these symptoms in past.    Has tried Maalox but did not seem to help too much.    Does have h/o GERD but she feels this is different.   CBGs have been stable per pt.  Current Medications (verified): 1)  Fexofenadine Hcl 180 Mg Tabs (Fexofenadine Hcl) .... Take 1 Tablet By Mouth Once A Day As Needed 2)  Ranitidine Hcl 300 Mg Caps (Ranitidine Hcl) .... Take 1 Capsule By Mouth Two Times A Day 3)  Lisinopril 5 Mg Tabs (Lisinopril) .... Take 1 Tablet By Mouth Once A Day 4)  Glimepiride 4 Mg Tabs (Glimepiride) .... Take 2 Tablets By Mouth Once Daily 5)  Januvia 100 Mg Tabs (Sitagliptin Phosphate) .... Take 1 Tablet By Mouth Once A Day 6)  Fluticasone Propionate 50 Mcg/act Susp (Fluticasone Propionate) .... Two Sprays Each Nostril Once Daily 7)  Multivitamins   Tabs (Multiple Vitamin) .... Take 1 Tablet By Mouth Once A Day 8)  Calcium Carbonate-Vitamin D 600-400 Mg-Unit  Tabs (Calcium Carbonate-Vitamin D) .... Take 1 Tablet By Mouth Once A Day 9)  Pancreaze 4200 Unit Cpep (Pancrelipase (Lip-Prot-Amyl)) .Marland Kitchen.. 1 Tab Before Every Meal. 10)  One Touch  Test Strp (Glucose Blood) .... Use As Directed 11)  Ulticare Mini Pen Needles 31g X 6 Mm Misc (Insulin Pen Needle) .... Use As Directed. 12)  Lantus Solostar 100 Unit/ml Soln (Insulin Glargine) .... Lantus Pen- 5 Units At Bedtime. Send Overnight. 13)  Ultiguard Needle Dispenser  Allergies: 1)  ! Codeine 2)  ! Penicillin  Past History:  Past Medical History: Last updated: 04/22/2009 Allergic rhinitis Diabetes mellitus, type II GERD Hyperlipidemia Hypertension  Past Surgical History: Last updated: 04/22/2009 Hysterectomy 1976 for fibroids Cholecystectomy 2008 Tonsillectomy 1954  Family History: Last updated: 04/22/2009 Mom - alzheimers, h/o breast CA in 83s, DM Dad- died of massive MI at 15  Social History: Last updated: 04/22/2009 Recently moved here from [place] to take care of mother with Alzheimers.  Has two sisters who live here, not helping care for mom.  Quit smoking 1 year ago with Chantix.  Retired from [employer]. Alcohol use-no Drug use-no Regular exercise-no  Risk Factors: Exercise: no (04/22/2009)  Review of Systems      See HPI General:  Denies fever. CV:  Denies chest pain or discomfort, difficulty breathing at night, palpitations, shortness of breath with exertion, swelling of feet, and swelling of hands. GI:  Complains of abdominal pain and gas; denies  diarrhea, indigestion, loss of appetite, nausea, and vomiting.  Physical Exam  General:   nad, alert and oriented  Mouth:  MMM Chest Wall:  mildly TTP over right chest wall. Breasts:  No mass, nodules, thickening, tenderness, bulging, retraction, inflamation, nipple discharge or skin changes noted.   Lungs:  Normal respiratory effort, chest expands symmetrically. Lungs are clear to auscultation, no crackles or wheezes. Heart:  normal rate and no murmur.   Abdomen:  Bowel sounds positive,abdomen soft and non-tender without masses, organomegaly or hernias  noted. Extremities:  No clubbing, cyanosis, edema, or deformity noted with normal full range of motion of all joints.   Psych:  Oriented X3 and normally interactive.     Impression & Recommendations:  Problem # 1:  ABDOMINAL PAIN, EPIGASTRIC (ICD-789.06) Assessment New Given her risk factors, ordered EKG with showed normal variant. Will check lipase, BMET, hepatic panel as pt is a diabetic. Could be gas pains, will try higher dose simethicone.  Her updated medication list for this problem includes:    Gas Relief 80 Mg Chew (Simethicone) .Marland Kitchen... 1-4 tablets by mouth as needed for gas pain (after meals and/or at bedtime)  Orders: EKG w/ Interpretation (93000) Venipuncture (04540) TLB-BMP (Basic Metabolic Panel-BMET) (80048-METABOL) TLB-Hepatic/Liver Function Pnl (80076-HEPATIC) TLB-Lipase (83690-LIPASE)  Problem # 2:  DIABETES MELLITUS, TYPE II (ICD-250.00) Assessment: Unchanged recheck a1c today. Her updated medication list for this problem includes:    Lisinopril 5 Mg Tabs (Lisinopril) .Marland Kitchen... Take 1 tablet by mouth once a day    Glimepiride 4 Mg Tabs (Glimepiride) .Marland Kitchen... Take 2 tablets by mouth once daily    Januvia 100 Mg Tabs (Sitagliptin phosphate) .Marland Kitchen... Take 1 tablet by mouth once a day    Lantus Solostar 100 Unit/ml Soln (Insulin glargine) .Marland Kitchen... Lantus pen- 5 units at bedtime. send overnight.  Orders: TLB-A1C / Hgb A1C (Glycohemoglobin) (83036-A1C)  Complete Medication List: 1)  Fexofenadine Hcl 180 Mg Tabs (Fexofenadine hcl) .... Take 1 tablet by mouth once a day as needed 2)  Ranitidine Hcl 300 Mg Caps (Ranitidine hcl) .... Take 1 capsule by mouth two times a day 3)  Lisinopril 5 Mg Tabs (Lisinopril) .... Take 1 tablet by mouth once a day 4)  Glimepiride 4 Mg Tabs (Glimepiride) .... Take 2 tablets by mouth once daily 5)  Januvia 100 Mg Tabs (Sitagliptin phosphate) .... Take 1 tablet by mouth once a day 6)  Fluticasone Propionate 50 Mcg/act Susp (Fluticasone propionate)  .... Two sprays each nostril once daily 7)  Multivitamins Tabs (Multiple vitamin) .... Take 1 tablet by mouth once a day 8)  Calcium Carbonate-vitamin D 600-400 Mg-unit Tabs (Calcium carbonate-vitamin d) .... Take 1 tablet by mouth once a day 9)  Pancreaze 4200 Unit Cpep (Pancrelipase (lip-prot-amyl)) .Marland Kitchen.. 1 tab before every meal. 10)  One Touch Test Strp (Glucose blood) .... Use as directed 11)  Ulticare Mini Pen Needles 31g X 6 Mm Misc (Insulin pen needle) .... Use as directed. 12)  Lantus Solostar 100 Unit/ml Soln (Insulin glargine) .... Lantus pen- 5 units at bedtime. send overnight. 13)  Ultiguard Needle Dispenser  14)  Gas Relief 80 Mg Chew (Simethicone) .Marland Kitchen.. 1-4 tablets by mouth as needed for gas pain (after meals and/or at bedtime) Prescriptions: GAS RELIEF 80 MG CHEW (SIMETHICONE) 1-4 tablets by mouth as needed for gas pain (after meals and/or at bedtime)  #60 x 0   Entered and Authorized by:   Ruthe Mannan MD   Signed by:   Ruthe Mannan MD on 06/29/2010  Method used:   Electronically to        [facility]  [address]* (retail)       [address], [address]       [place], [place]  [address]       Ph: [phone]       Fax: [phone]   RxID:   [id number]    Orders Added: 1)  EKG w/ Interpretation [93000] 2)  Venipuncture [47425] 3)  TLB-BMP (Basic Metabolic Panel-BMET) [80048-METABOL] 4)  TLB-Hepatic/Liver Function Pnl [80076-HEPATIC] 5)  TLB-Lipase [83690-LIPASE] 6)  TLB-A1C / Hgb A1C (Glycohemoglobin) [83036-A1C] 7)  Est. Patient Level IV [95638]    Current Allergies (reviewed today): ! CODEINE ! PENICILLIN

## 2010-08-29 ENCOUNTER — Telehealth: Payer: Self-pay | Admitting: Family Medicine

## 2010-08-29 NOTE — Telephone Encounter (Signed)
Patient called and was upset because she has been trying to get her Rx for Pancreaze filled and she has not received it yet.  We last refilled it on 08/09/2010.  I called [facility] and spoke with Alycia Rossetti, he refilled the medication #90 with 3 refills.  Patient was advised.  She stated that she has never had this problem until she moved to [facility].  She stated that she may be finding her a new doctor because this is unnecessary.

## 2010-08-29 NOTE — Telephone Encounter (Signed)
So did we not send it in appropriately or was it an issue with the mail order pharmacy?

## 2010-08-30 NOTE — Telephone Encounter (Signed)
Spoke with Alycia Rossetti and [facility] and they just needed a new Rx.  Gave the verbal order for medication over the phone.

## 2010-09-04 ENCOUNTER — Telehealth: Payer: Self-pay | Admitting: *Deleted

## 2010-09-04 NOTE — Telephone Encounter (Signed)
Received fax from [facility] to be signed to renew pancreaze. Fax is in your box.

## 2010-09-05 NOTE — Telephone Encounter (Signed)
Completed in my box.

## 2010-09-08 ENCOUNTER — Telehealth: Payer: Self-pay | Admitting: *Deleted

## 2010-09-08 NOTE — Telephone Encounter (Signed)
[facility] is asking for clarification on pancreas script, directions are missing.  Correct directions added to form and form faxed [facility].

## 2010-10-23 ENCOUNTER — Encounter: Payer: Self-pay | Admitting: Family Medicine

## 2010-11-17 ENCOUNTER — Encounter: Payer: Self-pay | Admitting: Internal Medicine

## 2010-11-17 ENCOUNTER — Ambulatory Visit (INDEPENDENT_AMBULATORY_CARE_PROVIDER_SITE_OTHER): Payer: BC Managed Care – PPO | Admitting: Internal Medicine

## 2010-11-17 ENCOUNTER — Ambulatory Visit: Payer: Self-pay | Admitting: Family Medicine

## 2010-11-17 VITALS — BP 142/84 | HR 74 | Temp 98.4°F | Wt 137.8 lb

## 2010-11-17 DIAGNOSIS — J019 Acute sinusitis, unspecified: Secondary | ICD-10-CM

## 2010-11-17 MED ORDER — AZITHROMYCIN 250 MG PO TABS: 250.0000 mg | ORAL_TABLET | Freq: Every day | ORAL | Status: AC

## 2010-11-17 NOTE — Progress Notes (Signed)
Subjective:    Patient ID: Audrey Green, female    DOB: 10/14/1945, 65 y.o.   MRN: [id number]  HPI Has had a cold for 6 days Not going away Sore in chest mucinex is not helping  Intermittent fever Lots of cough---dark yellow phlegm Sneezing and PND No SOB  Some sore throat Right ear pain  Tried some tylenol--did help some  Current outpatient prescriptions:Calcium Carb-Cholecalciferol (EQL CALCIUM/VITAMIN D) 600-400 MG-UNIT TABS, Take by mouth daily.  , Disp: , Rfl: ;  fexofenadine (ALLEGRA) 180 MG tablet, Take 180 mg by mouth daily.  , Disp: , Rfl: ;  fluticasone (FLONASE) 50 MCG/ACT nasal spray, Place 2 sprays into the nose daily. , Disp: , Rfl: ;  glimepiride (AMARYL) 4 MG tablet, Take 8 mg by mouth daily. , Disp: , Rfl:  glucose blood (ONE TOUCH TEST STRIPS) test strip, 1 each by Other route as needed. Use as instructed , Disp: , Rfl: ;  Insulin Glargine (LANTUS SOLOSTAR [place]), Inject 5 Units into the skin daily.  , Disp: , Rfl: ;  Insulin Pen Needle (ULTICARE MINI PEN NEEDLES) 31G X 6 MM MISC, by Does not apply route.  , Disp: , Rfl: ;  JANUVIA 100 MG tablet, Take 100 mg by mouth daily. , Disp: , Rfl:  lisinopril (PRINIVIL,ZESTRIL) 5 MG tablet, Take 5 mg by mouth daily. , Disp: , Rfl: ;  Multiple Vitamin (MULTIVITAMIN) capsule, Take 1 capsule by mouth daily.  , Disp: , Rfl: ;  PANCREAZE 4200 UNITS CPEP, Take 1 capsule by mouth daily. , Disp: , Rfl: ;  ranitidine (ZANTAC) 300 MG capsule, Take 300 mg by mouth 2 (two) times daily. , Disp: , Rfl:  simethicone (MYLICON) 80 MG chewable tablet, Chew 80 mg by mouth every 6 (six) hours as needed.  , Disp: , Rfl:   Past Medical History  Diagnosis Date  . Allergy   . Diabetes mellitus   . GERD (gastroesophageal reflux disease)   . Hyperlipidemia   . Hypertension     Past Surgical History  Procedure Date  . Abdominal hysterectomy   . Cholecystectomy   . Tonsillectomy     No family history on file.  History   Social History  . Marital  Status: Single    Spouse Name: N/A    Number of Children: N/A  . Years of Education: N/A   Occupational History  . retired- Buyer, retail    Social History Main Topics  . Smoking status: Former Smoker    Quit date: 06/11/2008  . Smokeless tobacco: Never Used  . Alcohol Use: No  . Drug Use: No  . Sexually Active: Not on file   Other Topics Concern  . Not on file   Social History Narrative   Recently moved here from [place] to take care of mother with Alzheimers.  Has two sisters who live here, not helping care for mom.     Review of Systems No nausea or vomiting Appetite is okay     Objective:   Physical Exam  Constitutional: She appears well-developed and well-nourished. No distress.  HENT:  Head: Normocephalic and atraumatic.  Right Ear: External ear normal.  Left Ear: External ear normal.  Mouth/Throat: Oropharynx is clear and moist. No oropharyngeal exudate.       No sinus tenderness Moderate nasal inflammation  Neck: Normal range of motion. Neck supple.  Pulmonary/Chest: Effort normal and breath sounds normal. No respiratory distress. She has no wheezes. She has no rales.  Lymphadenopathy:    She has no cervical adenopathy.          Assessment & Plan:

## 2010-11-17 NOTE — Assessment & Plan Note (Signed)
May have element of bronchitis as well Discussed likely viral etiology Will have her continue the tylenol  z-pak if worsens

## 2010-11-17 NOTE — Patient Instructions (Signed)
Continue the tylenol If you get worse over the next few days, go ahead and start the z-pak

## 2010-11-20 ENCOUNTER — Ambulatory Visit: Payer: Self-pay | Admitting: Family Medicine

## 2010-11-29 ENCOUNTER — Ambulatory Visit (INDEPENDENT_AMBULATORY_CARE_PROVIDER_SITE_OTHER): Payer: BC Managed Care – PPO | Admitting: Family Medicine

## 2010-11-29 ENCOUNTER — Encounter: Payer: Self-pay | Admitting: Family Medicine

## 2010-11-29 VITALS — BP 130/64 | HR 78 | Temp 98.3°F | Wt 137.8 lb

## 2010-11-29 DIAGNOSIS — J069 Acute upper respiratory infection, unspecified: Secondary | ICD-10-CM

## 2010-11-29 MED ORDER — BENZONATATE 200 MG PO CAPS: 200.0000 mg | ORAL_CAPSULE | Freq: Three times a day (TID) | ORAL | Status: AC | PRN

## 2010-11-29 NOTE — Progress Notes (Signed)
Subjective:    Patient ID: Audrey Green, female    DOB: January 06, 1946, 65 y.o.   MRN: [id number]  HPI Has had a cold for several weeks. Saw Dr. Alphonsus Sias on 6/8, advised to fill rx for Zpack if symptoms did not improve. Filled the rx on 6/10.  Not going away Sore in chest Mucinex, Zyrtec is not helping  INo SOB  Allergic to Codeine.  Current outpatient prescriptions:Calcium Carb-Cholecalciferol (EQL CALCIUM/VITAMIN D) 600-400 MG-UNIT TABS, Take by mouth daily.  , Disp: , Rfl: ;  fexofenadine (ALLEGRA) 180 MG tablet, Take 180 mg by mouth daily.  , Disp: , Rfl: ;  fluticasone (FLONASE) 50 MCG/ACT nasal spray, Place 2 sprays into the nose daily. , Disp: , Rfl: ;  glimepiride (AMARYL) 4 MG tablet, Take 8 mg by mouth daily. , Disp: , Rfl:  glucose blood (ONE TOUCH TEST STRIPS) test strip, 1 each by Other route as needed. Use as instructed , Disp: , Rfl: ;  Insulin Glargine (LANTUS SOLOSTAR [place]), Inject 5 Units into the skin daily.  , Disp: , Rfl: ;  Insulin Pen Needle (ULTICARE MINI PEN NEEDLES) 31G X 6 MM MISC, by Does not apply route.  , Disp: , Rfl: ;  JANUVIA 100 MG tablet, Take 100 mg by mouth daily. , Disp: , Rfl:  lisinopril (PRINIVIL,ZESTRIL) 5 MG tablet, Take 5 mg by mouth daily. , Disp: , Rfl: ;  Multiple Vitamin (MULTIVITAMIN) capsule, Take 1 capsule by mouth daily.  , Disp: , Rfl: ;  PANCREAZE 4200 UNITS CPEP, Take 1 capsule by mouth daily. , Disp: , Rfl: ;  ranitidine (ZANTAC) 300 MG capsule, Take 300 mg by mouth 2 (two) times daily. , Disp: , Rfl:  simethicone (MYLICON) 80 MG chewable tablet, Chew 80 mg by mouth every 6 (six) hours as needed.  , Disp: , Rfl:   Past Medical History  Diagnosis Date  . Allergy   . Diabetes mellitus   . GERD (gastroesophageal reflux disease)   . Hyperlipidemia   . Hypertension     Past Surgical History  Procedure Date  . Abdominal hysterectomy   . Cholecystectomy   . Tonsillectomy     No family history on file.  History   Social History  .  Marital Status: Single    Spouse Name: N/A    Number of Children: N/A  . Years of Education: N/A   Occupational History  . retired- Buyer, retail    Social History Main Topics  . Smoking status: Former Smoker    Quit date: 06/11/2008  . Smokeless tobacco: Never Used  . Alcohol Use: No  . Drug Use: No  . Sexually Active: Not on file   Other Topics Concern  . Not on file   Social History Narrative   Recently moved here from [place] to take care of mother with Alzheimers.  Has two sisters who live here, not helping care for mom.     Review of Systems No nausea or vomiting Appetite is okay     Objective:   Physical Exam  BP 130/64  Pulse 78  Temp(Src) 98.3 F (36.8 C) (Oral)  Wt 137 lb 12 oz (62.483 kg)  Constitutional: She appears well-developed and well-nourished. No distress.  HENT:  Head: Normocephalic and atraumatic.  Right Ear: External ear normal.  Left Ear: External ear normal.  Mouth/Throat: Oropharynx is clear and moist. No oropharyngeal exudate.       No sinus tenderness Moderate nasal inflammation  Neck:  Normal range of motion. Neck supple.  Pulmonary/Chest: Effort normal and breath sounds normal. No respiratory distress. She has no wheezes. She has no rales.  Lymphadenopathy:    She has no cervical adenopathy.          Assessment & Plan:   1. URI    Unchanged. Likely viral, symptoms not worsening.   Will add Tessalon perles (allergic to codeine).

## 2010-12-05 ENCOUNTER — Telehealth: Payer: Self-pay | Admitting: *Deleted

## 2010-12-05 MED ORDER — DOXYCYCLINE HYCLATE 100 MG PO CAPS: 100.0000 mg | ORAL_CAPSULE | Freq: Two times a day (BID) | ORAL | Status: AC

## 2010-12-05 NOTE — Telephone Encounter (Signed)
Message left on machine at home advising patient as instructed.

## 2010-12-05 NOTE — Telephone Encounter (Signed)
Rx for doxycycline sent. If symptoms do not improve by the end of the week, pt needs to be re evaluated.

## 2010-12-05 NOTE — Telephone Encounter (Signed)
Patient was seen last week and says that she is still not feeling any better. Still coughing, still hoarse. She is asking if she can have an antibiotic called in. Uses [facility].

## 2010-12-09 ENCOUNTER — Emergency Department: Payer: Self-pay | Admitting: Internal Medicine

## 2010-12-12 ENCOUNTER — Other Ambulatory Visit: Payer: Self-pay | Admitting: Family Medicine

## 2010-12-12 ENCOUNTER — Ambulatory Visit (INDEPENDENT_AMBULATORY_CARE_PROVIDER_SITE_OTHER)
Admission: RE | Admit: 2010-12-12 | Discharge: 2010-12-12 | Disposition: A | Discharge status: Home / Self Care | Payer: BC Managed Care – PPO | Source: Ambulatory Visit | Attending: Family Medicine | Admitting: Family Medicine

## 2010-12-12 ENCOUNTER — Telehealth: Payer: Self-pay | Admitting: *Deleted

## 2010-12-12 ENCOUNTER — Ambulatory Visit (INDEPENDENT_AMBULATORY_CARE_PROVIDER_SITE_OTHER): Payer: BC Managed Care – PPO | Admitting: Family Medicine

## 2010-12-12 ENCOUNTER — Encounter: Payer: Self-pay | Admitting: Family Medicine

## 2010-12-12 VITALS — BP 120/80 | HR 77 | Temp 97.7°F | Wt 133.8 lb

## 2010-12-12 DIAGNOSIS — E041 Nontoxic single thyroid nodule: Secondary | ICD-10-CM

## 2010-12-12 DIAGNOSIS — R1013 Epigastric pain: Secondary | ICD-10-CM

## 2010-12-12 DIAGNOSIS — R109 Unspecified abdominal pain: Secondary | ICD-10-CM

## 2010-12-12 DIAGNOSIS — K2970 Gastritis, unspecified, without bleeding: Secondary | ICD-10-CM

## 2010-12-12 DIAGNOSIS — K2990 Gastroduodenitis, unspecified, without bleeding: Secondary | ICD-10-CM

## 2010-12-12 LAB — CBC WITH DIFFERENTIAL/PLATELET
Basophils Relative: 0.5 % (ref 0.0–3.0)
Eosinophils Relative: 3.9 % (ref 0.0–5.0)
MCV: 88.6 fl (ref 78.0–100.0)
Monocytes Absolute: 0.9 10*3/uL (ref 0.1–1.0)
Monocytes Relative: 8.4 % (ref 3.0–12.0)
Neutrophils Relative %: 37.3 % — ABNORMAL LOW (ref 43.0–77.0)
Platelets: 363 10*3/uL (ref 150.0–400.0)
RBC: 4.58 Mil/uL (ref 3.87–5.11)
WBC: 10.3 10*3/uL (ref 4.5–10.5)

## 2010-12-12 LAB — BASIC METABOLIC PANEL
BUN: 14 mg/dL (ref 6–23)
Chloride: 103 mEq/L (ref 96–112)
Creatinine, Ser: 1.2 mg/dL (ref 0.4–1.2)
GFR: 56.68 mL/min — ABNORMAL LOW (ref >60.00)
Potassium: 4.9 mEq/L (ref 3.5–5.1)

## 2010-12-12 LAB — LIPASE: Lipase: 37 U/L (ref 11.0–59.0)

## 2010-12-12 MED ORDER — IOHEXOL 300 MG/ML  SOLN
80.0000 mL | Freq: Once | INTRAMUSCULAR | Status: AC | PRN
  Administered 2010-12-12: 80 mL via INTRAVENOUS

## 2010-12-12 MED ORDER — ONDANSETRON HCL 4 MG PO TABS: 4.0000 mg | ORAL_TABLET | Freq: Every day | ORAL | Status: DC | PRN

## 2010-12-12 MED ORDER — IOHEXOL 300 MG/ML  SOLN: 75.0000 mL | Freq: Once | INTRAMUSCULAR | Status: AC | PRN

## 2010-12-12 NOTE — Telephone Encounter (Signed)
I have to wait for the results first. Thanks

## 2010-12-12 NOTE — Patient Instructions (Signed)
Please stop by to see Audrey Green on your way out.   

## 2010-12-12 NOTE — Progress Notes (Signed)
Addended by: Baldomero Lamy on: 12/12/2010 01:13 PM   Modules accepted: Orders

## 2010-12-12 NOTE — Progress Notes (Signed)
Addended by: Alvina Chou on: 12/12/2010 02:39 PM   Modules accepted: Orders

## 2010-12-12 NOTE — Telephone Encounter (Signed)
When Patient came back in office to pick up contrast she requested pain medication I told her that you were probably waiting for all of her test to come back before you prescribe any pain medication. Patient was very understanding and would like you to call something in when ever you can.

## 2010-12-12 NOTE — Progress Notes (Signed)
Addended by: Alvina Chou on: 12/12/2010 12:57 PM   Modules accepted: Orders, Level of Service

## 2010-12-12 NOTE — Progress Notes (Signed)
Subjective:    Patient ID: Audrey Green, female    DOB: 1946-01-06, 65 y.o.   MRN: [id number]  HPI 65 yo with h/o DM, GERD, pancreatitis here for [facility] ER follow up for abdominal pain.     ER notes reviewed-   Went to ER on 12/09/2010 with epigastric pain x 3 days that felt similar to pain she felt when she had pancreatitis.  Had been on Doxycyline for doxycyline (PCN and sulfa allergic) after she failed Zpack.  DXR of abdomen- non obstructive gas pattern with moderate to large amounts of stool. CXR showed no active cardiopulmonary disease. CBC, Lipase, UA, Troponins within normal limits. EKG NSR.  Told she had gastritis and advised to take Pepcid every 8 hours and finish her doxycycline. Feels even worse today.  Vomited twice last night. Has chills without fever. No diarrhea.  Patient Active Problem List  Diagnoses  . STREPTOCOCCAL PHARYNGITIS  . Type II or unspecified type diabetes mellitus without mention of complication, not stated as uncontrolled  . HYPERLIPIDEMIA  . ESSENTIAL HYPERTENSION, BENIGN  . HYPERTENSION  . SINUSITIS - ACUTE-NOS  . URI  . ALLERGIC RHINITIS  . GERD  . HYPERLIPIDEMIA, MILD, HX OF  . ABDOMINAL PAIN, EPIGASTRIC  . Gastritis   Past Medical History  Diagnosis Date  . Allergy   . Diabetes mellitus   . GERD (gastroesophageal reflux disease)   . Hyperlipidemia   . Hypertension    Past Surgical History  Procedure Date  . Abdominal hysterectomy   . Cholecystectomy   . Tonsillectomy    History  Substance Use Topics  . Smoking status: Former Smoker    Quit date: 06/11/2008  . Smokeless tobacco: Never Used  . Alcohol Use: No   No family history on file. Allergies  Allergen Reactions  . Codeine     REACTION: Hives, tongue swells  . Penicillins     REACTION: rash and itching   Current Outpatient Prescriptions on File Prior to Visit  Medication Sig Dispense Refill  . Calcium Carb-Cholecalciferol (EQL CALCIUM/VITAMIN D) 600-400 MG-UNIT TABS  Take by mouth daily.        Marland Kitchen doxycycline (VIBRAMYCIN) 100 MG capsule Take 1 capsule (100 mg total) by mouth 2 (two) times daily.  20 capsule  0  . fexofenadine (ALLEGRA) 180 MG tablet Take 180 mg by mouth daily.        . fluticasone (FLONASE) 50 MCG/ACT nasal spray Place 2 sprays into the nose daily.       Marland Kitchen glimepiride (AMARYL) 4 MG tablet Take 8 mg by mouth daily.       Marland Kitchen glucose blood (ONE TOUCH TEST STRIPS) test strip 1 each by Other route as needed. Use as instructed       . Insulin Glargine (LANTUS SOLOSTAR [place]) Inject 5 Units into the skin daily.        . Insulin Pen Needle (ULTICARE MINI PEN NEEDLES) 31G X 6 MM MISC by Does not apply route.        Marland Kitchen JANUVIA 100 MG tablet Take 100 mg by mouth daily.       Marland Kitchen lisinopril (PRINIVIL,ZESTRIL) 5 MG tablet Take 5 mg by mouth daily.       . Multiple Vitamin (MULTIVITAMIN) capsule Take 1 capsule by mouth daily.        Marland Kitchen PANCREAZE 4200 UNITS CPEP Take 1 capsule by mouth daily.       . ranitidine (ZANTAC) 300 MG capsule Take 300 mg by mouth 2 (  two) times daily.       . simethicone (MYLICON) 80 MG chewable tablet Chew 80 mg by mouth every 6 (six) hours as needed.            Review of Systems See HPI    Objective:   Physical Exam BP 120/80  Pulse 77  Temp(Src) 97.7 F (36.5 C) (Oral)  Wt 133 lb 12 oz (60.669 kg)  General:  Well-developed,well-nourished,in no acute distress; alert,appropriate and cooperative throughout examination Head:  normocephalic and atraumatic.   Eyes:  vision grossly intact, pupils equal, pupils round, and pupils reactive to light.   Ears:  R ear normal and L ear normal.   Nose:  no external deformity.   Mouth:  good dentition.   Neck:  No deformities, masses, or tenderness noted. Lungs:  Normal respiratory effort, chest expands symmetrically. Lungs are clear to auscultation, no crackles or wheezes. Heart:  Normal rate and regular rhythm. S1 and S2 normal without gallop, murmur, click, rub or other extra  sounds. Abdomen:  Bowel sounds positive,+ ttp over epigastric area, no rebound, no guarding. Msk:  No deformity or scoliosis noted of thoracic or lumbar spine.   Extremities:  No clubbing, cyanosis, edema, or deformity noted with normal full range of motion of all joints.   Neurologic:  alert & oriented X3 and gait normal.   Skin:  Intact without suspicious lesions or rashes Cervical Nodes:  No lymphadenopathy noted Axillary Nodes:  No palpable lymphadenopathy Psych:  Cognition and judgment appear intact. Alert and cooperative with normal attention span and concentration. No apparent delusions, illusions, hallucinations        Assessment & Plan:    1. Abdominal pain, epigastric    Deteriorated. Pt very uncomfortable and I am concerned that she does in fact have pancreatitis. Will place on Zofran, order lipase, BMET and CBC. Stat CT of abdomen. If unremarkable, refer to GI for further workup/endoscopy.

## 2010-12-13 ENCOUNTER — Emergency Department: Payer: Self-pay | Admitting: Emergency Medicine

## 2010-12-14 ENCOUNTER — Other Ambulatory Visit: Payer: Self-pay | Admitting: Family Medicine

## 2010-12-15 ENCOUNTER — Telehealth: Payer: Self-pay | Admitting: Internal Medicine

## 2010-12-15 NOTE — Telephone Encounter (Signed)
I spoke with Audrey Green the patient moved to the area 3 years ago.  She does have GI history of Pancreatitis 3 years ago in [place].  Dr Elmer Sow office will have her sign a release today and try and get records.  She is scheduled to see Dr. Russella Dar on 12/20/10 9:30

## 2010-12-18 ENCOUNTER — Other Ambulatory Visit: Payer: Self-pay | Admitting: Family Medicine

## 2010-12-18 ENCOUNTER — Ambulatory Visit
Admission: RE | Admit: 2010-12-18 | Discharge: 2010-12-18 | Disposition: A | Discharge status: Home / Self Care | Payer: BC Managed Care – PPO | Source: Ambulatory Visit | Attending: Family Medicine | Admitting: Family Medicine

## 2010-12-18 DIAGNOSIS — E041 Nontoxic single thyroid nodule: Secondary | ICD-10-CM

## 2010-12-18 DIAGNOSIS — E049 Nontoxic goiter, unspecified: Secondary | ICD-10-CM | POA: Insufficient documentation

## 2010-12-20 ENCOUNTER — Encounter: Payer: Self-pay | Admitting: Gastroenterology

## 2010-12-20 ENCOUNTER — Ambulatory Visit (INDEPENDENT_AMBULATORY_CARE_PROVIDER_SITE_OTHER): Payer: BC Managed Care – PPO | Admitting: Gastroenterology

## 2010-12-20 VITALS — BP 132/64 | HR 80 | Ht 63.0 in | Wt 132.0 lb

## 2010-12-20 DIAGNOSIS — Z1211 Encounter for screening for malignant neoplasm of colon: Secondary | ICD-10-CM

## 2010-12-20 DIAGNOSIS — R1013 Epigastric pain: Secondary | ICD-10-CM

## 2010-12-20 MED ORDER — ESOMEPRAZOLE MAGNESIUM 40 MG PO CPDR: 40.0000 mg | DELAYED_RELEASE_CAPSULE | Freq: Two times a day (BID) | ORAL | Status: DC

## 2010-12-20 NOTE — Patient Instructions (Signed)
You have been scheduled for a Upper Endoscopy. Separate instructions given. Stop Pepcid and Zantac. Start Nexium one tablet by mouth twice daily. Prescription has been sent to your pharmacy.  Follow instructions on Hemoccult cards and mail back to [place] when finished.  cc: Ruthe Mannan, MD

## 2010-12-20 NOTE — Progress Notes (Signed)
History of Present Illness: This is a 65 year old female complaining of severe epigastric pain for the past 10 days. Her symptoms are exacerbated by meals and she relates her symptoms are so severe she is unable to sleep at night. She has extensive records from prior evaluations 2006-2008 in [place] for abdominal pain. I have reviewed her records. There was a history of pancreatitis in 2007 however I do not have blood work or imaging studies that clearly document pancreatitis. CT scan showed slight prominence of the pancreas and pancreatic duct. MRCP was performed and that was normal. A small gallstone was noted and this was felt to be a possible etiology for her pancreatitis and she underwent cholecystectomy. She underwent EGD in 2008 that showed mild reactive gastritis, possibly bile or NSAID-related. Colonoscopy and EGD were performed in 2006 which showed a hyperplastic polyp and mild gastritis.  She has been maintained on ranitidine 300 mg twice daily, pancrease enzyme supplements and simethicone since that time. She states she is generally done well without any significant digestive complaints for several years. Her current symptoms began acutely 10 days ago. She was evaluated [facility] emergency department and after evaluation she was prescribed an antispasmotic. She states the anti-spasmodic did not help her pain and it led to lightheadedness so she discontinued it. A urinalysis, CBC, CMET and lipase were all unremarkable except for an elevated glucose of 195 and a creatinine of 1.68. A CT scan of the abdomen and pelvis performed on July 3 was unremarkable. Blood work performed at Dr. Elmer Sow office was also normal. Famotidine was added several days ago with no change in symptoms.  Past Medical History  Diagnosis Date  . Allergy   . Diabetes mellitus   . GERD (gastroesophageal reflux disease)   . Hyperlipidemia   . Hypertension   . Pancreatitis   . Osteoporosis   . History  of colon polyps    Past Surgical History  Procedure Date  . Abdominal hysterectomy   . Cholecystectomy   . Tonsillectomy   . Hand surgery     reports that she quit smoking about 2 years ago. She has never used smokeless tobacco. She reports that she does not drink alcohol or use illicit drugs. family history includes Breast cancer in her mother; Diabetes in her mother; and Heart disease in her mother.  There is no history of Colon cancer. Allergies  Allergen Reactions  . Codeine     REACTION: Hives, tongue swells  . Penicillins     REACTION: rash and itching  . Sulfa Drugs Cross Reactors     [place]   Outpatient Encounter Prescriptions as of 12/20/2010  Medication Sig Dispense Refill  . Calcium Carb-Cholecalciferol (EQL CALCIUM/VITAMIN D) 600-400 MG-UNIT TABS Take by mouth daily.        . famotidine (PEPCID) 10 MG tablet Take 10 mg by mouth 2 (two) times daily.        . fexofenadine (ALLEGRA) 180 MG tablet Take 180 mg by mouth daily.        . fluticasone (FLONASE) 50 MCG/ACT nasal spray Place 2 sprays into the nose daily.       Marland Kitchen glimepiride (AMARYL) 4 MG tablet Take 8 mg by mouth daily.       Marland Kitchen glucose blood (ONE TOUCH TEST STRIPS) test strip 1 each by Other route as needed. Use as instructed       . Insulin Glargine (LANTUS SOLOSTAR [place]) Inject 10 Units into the skin at bedtime.       Marland Kitchen  Insulin Pen Needle (ULTICARE MINI PEN NEEDLES) 31G X 6 MM MISC by Does not apply route.        Marland Kitchen JANUVIA 100 MG tablet Take 100 mg by mouth daily.       Marland Kitchen lisinopril (PRINIVIL,ZESTRIL) 5 MG tablet Take 5 mg by mouth daily.       . Multiple Vitamin (MULTIVITAMIN) capsule Take 1 capsule by mouth daily.        . ondansetron (ZOFRAN) 4 MG tablet Take 1 tablet (4 mg total) by mouth daily as needed for nausea.  30 tablet  1  . PANCREAZE 4200 UNITS CPEP Take 1 capsule by mouth daily.       . ranitidine (ZANTAC) 300 MG capsule Take 300 mg by mouth 2 (two) times daily.       . simethicone (MYLICON) 80 MG  chewable tablet Chew 80 mg by mouth every 6 (six) hours as needed.        Marland Kitchen esomeprazole (NEXIUM) 40 MG capsule Take 1 capsule (40 mg total) by mouth 2 (two) times daily.  60 capsule  11   Review of Systems: Pertinent positive and negative review of systems were noted in the above HPI section. All other review of systems were otherwise negative.   Physical Exam: General: Well developed , well nourished, no acute distress Head: Normocephalic and atraumatic Eyes:  sclerae anicteric, EOMI Ears: Normal auditory acuity Mouth: No deformity or lesions Neck: Supple, no masses or thyromegaly Lungs: Clear throughout to auscultation Heart: Regular rate and rhythm; no murmurs, rubs or bruits Abdomen: Soft, minimal epigastric tenderness, mainly at the xiphoid process and non distended. No masses, hepatosplenomegaly or hernias noted. Normal Bowel sounds  Musculoskeletal: Symmetrical with no gross deformities  Skin: No lesions on visible extremities Pulses:  Normal pulses noted Extremities: No clubbing, cyanosis, edema or deformities noted Neurological: Alert oriented x 4, grossly nonfocal Cervical Nodes:  No significant cervical adenopathy Inguinal Nodes: No significant inguinal adenopathy Psychological:  Alert and cooperative. Normal mood and affect  Assessment and Recommendations:  1. Epigastric pain. Her complaints of epigastric pain do not correlate with her objective findings of minimal abdominal tenderness on physical exam, normal blood work and an unremarkable abdominal CT scan. Possible musculoskeletal symptoms or functional symptoms. Possible gastritis or ulcer disease. Possible gastroparesis although this is unlikely to begin acutely. Discontinue famotidine and ranitidine and begin acid esomeprazole 40 mg twice daily. Patient requests pain medication but I do not see any objective evidence to justify any medications above acid suppressants and anti-spasmodics at this time. Schedule upper  endoscopy. The risks, benefits, and alternatives to endoscopy with possible biopsy and possible dilation were discussed with the patient and they consent to proceed.   2. Possible history of pancreatitis. She has been maintained on the pancreas enzyme supplements with no clear evidence of chronic pancreatitis by imaging studies, blood work and history.  3. Colorectal cancer screening. Average risk. Screening colonoscopy recommended May 2016.  4. Abnormal CT of the chest. Further followup with Dr. Dayton Martes.

## 2010-12-25 ENCOUNTER — Other Ambulatory Visit: Payer: Self-pay | Admitting: Otolaryngology

## 2010-12-25 DIAGNOSIS — E041 Nontoxic single thyroid nodule: Secondary | ICD-10-CM

## 2010-12-25 DIAGNOSIS — E042 Nontoxic multinodular goiter: Secondary | ICD-10-CM

## 2010-12-26 ENCOUNTER — Other Ambulatory Visit: Payer: Self-pay | Admitting: Thoracic Surgery

## 2010-12-26 ENCOUNTER — Encounter (INDEPENDENT_AMBULATORY_CARE_PROVIDER_SITE_OTHER): Payer: BC Managed Care – PPO | Admitting: Thoracic Surgery

## 2010-12-26 DIAGNOSIS — D381 Neoplasm of uncertain behavior of trachea, bronchus and lung: Secondary | ICD-10-CM

## 2010-12-27 ENCOUNTER — Encounter: Payer: Self-pay | Admitting: Gastroenterology

## 2010-12-27 ENCOUNTER — Ambulatory Visit (AMBULATORY_SURGERY_CENTER): Payer: BC Managed Care – PPO | Admitting: Gastroenterology

## 2010-12-27 VITALS — BP 123/63 | HR 64 | Temp 97.0°F | Resp 16 | Ht 63.0 in | Wt 132.0 lb

## 2010-12-27 DIAGNOSIS — R1013 Epigastric pain: Secondary | ICD-10-CM

## 2010-12-27 MED ORDER — SODIUM CHLORIDE 0.9 % IV SOLN: 500.0000 mL | INTRAVENOUS | Status: DC

## 2010-12-27 NOTE — Letter (Signed)
December 26, 2010  Audrey Mannan, MD [address] [place], [place]  [address]  Re:  Green, Audrey               DOB:  Aug 14, 1945  Dear Dr. Dayton Martes:  I appreciate the opportunity of seeing this 65 year old patient.  I had a chest CT which showed multiple nodules which showed a 1.4 x 1 cm right lower lobe nodule and a 1.2 x 0.7 nodule in the left upper lobe and a small 5-mm nodule in the right upper lobe.  All had evidence of ground- glass appearance.  She also has an aberrant right subclavian artery but no evidence of dysphagia.  She underwent a thyroid ultrasound which showed a multinodular goiter with separate left-sided nodules and several smaller other nodules.  She is going to be seen by [facility].  She quit smoking 6 years ago.  She has no adenoma, hemoptysis, fever, chills, or excessive sputum.  PAST MEDICAL HISTORY:  She has had type 2 diabetes, hyperlipidemia, hypertension, sinus rhinitis, some history of pancreatitis, and gastroesophageal reflux disease as well.  She has had a previous abdominal hysterectomy and cholecystectomy and tonsillectomy.  MEDICATIONS:  Vitamin D3, Allegra, Flonase, Lantus, Januvia, lisinopril, hypertension, Pancrease, Mylicon, Nexium, and multivitamins.  ALLERGIES:  She is allergic to SULFA, CODEINE and PENICILLIN which cause hives.  SOCIAL HISTORY:  She quit smoking but she says 6 years ago but smoked for over 20-pack year history.  Does not drink alcohol on a regular basis.  She is single and retired.  FAMILY HISTORY IS:  Noncontributory.  REVIEW OF SYSTEMS:  VITAL SIGNS:  She is 130 pounds and she is 5 feet 3 inches.  GENERAL:  Her weights have been stable.  CARDIAC:  No angina or atrial fibrillation.  PULMONARY:  No hemoptysis or bronchitis.  GI:  She has abdominal pain, reflux, and constipation.  GU:  No kidney disease, dysuria, or frequent urination.  VASCULAR:  No claudication, DVT, TIAs. NEUROLOGIC:  No dizziness, headaches,  blackouts, seizures. MUSCULOSKELETAL:  No arthritis.  PSYCHIATRIC:  No depression or nervousness.  ENT:  No change in eyesight or hearing.  HEMATOLOGICAL: No problems with bleeding, clotting disorders, or anemia.  PHYSICAL EXAMINATION:  GENERAL:  She is a well-developed [place] female in no acute distress. VITAL SIGNS:  Her blood pressure is 134/71, pulse 82, respirations 18, and sats were 96%. HEAD, EYES, EARS, NOSE, AND THROAT:  Unremarkable. NECK:  Supple without thyromegaly.  There is no supraclavicular or axillary adenopathy. CHEST:  Clear to auscultation and percussion. HEART:  Regular sinus rhythm.  No murmurs. ABDOMEN:  Soft.  There is no hepatosplenomegaly. EXTREMITIES:  Pulses are 2+.  There is no clubbing or edema.  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  We will go ahead and get a PET scan on that.  If the PET scan is positive, then we will have to proceed with some type of biopsy.  Hopefully, we can do electromagnetic navigation bronchoscopy.  If PET scan is negative, then I usually follow these and see if they start getting bigger before proceeding with biopsies.  I appreciate the opportunity of seeing [name].  Ines Bloomer, M.D. Electronically Signed  DPB/MEDQ  D:  12/26/2010  T:  12/27/2010  Job:  [id number]

## 2010-12-27 NOTE — Patient Instructions (Signed)
Please review all discharge papers given to you by the recovery room nurse. Exam was normal today.  Dr Russella Dar recommends you take your PPI two times a day for one month, then change to every morning long term.  If you experience any problems after discharge please call [phone]. You will receive a call in the am from one of our nurses to see how you are doing and the answer any questions you may have.  Thank you.

## 2010-12-28 ENCOUNTER — Telehealth: Payer: Self-pay | Admitting: *Deleted

## 2010-12-28 NOTE — Telephone Encounter (Signed)

## 2010-12-29 ENCOUNTER — Ambulatory Visit (HOSPITAL_COMMUNITY)
Admission: RE | Admit: 2010-12-29 | Discharge: 2010-12-29 | Disposition: A | Discharge status: Home / Self Care | Payer: BC Managed Care – PPO | Source: Ambulatory Visit | Attending: Thoracic Surgery | Admitting: Thoracic Surgery

## 2010-12-29 DIAGNOSIS — D381 Neoplasm of uncertain behavior of trachea, bronchus and lung: Secondary | ICD-10-CM | POA: Insufficient documentation

## 2010-12-29 LAB — GLUCOSE, CAPILLARY: Glucose-Capillary: 181 mg/dL — ABNORMAL HIGH (ref 70–99)

## 2010-12-29 MED ORDER — FLUDEOXYGLUCOSE F - 18 (FDG) INJECTION
16.9000 | Freq: Once | INTRAVENOUS | Status: AC | PRN
  Administered 2010-12-29: 16.9 via INTRAVENOUS

## 2011-01-02 ENCOUNTER — Ambulatory Visit: Payer: BC Managed Care – PPO | Admitting: Thoracic Surgery

## 2011-01-02 ENCOUNTER — Ambulatory Visit
Admission: RE | Admit: 2011-01-02 | Discharge: 2011-01-02 | Disposition: A | Discharge status: Home / Self Care | Payer: BC Managed Care – PPO | Source: Ambulatory Visit | Attending: Otolaryngology | Admitting: Otolaryngology

## 2011-01-02 ENCOUNTER — Other Ambulatory Visit (HOSPITAL_COMMUNITY)
Admission: RE | Admit: 2011-01-02 | Discharge: 2011-01-02 | Disposition: A | Discharge status: Home / Self Care | Payer: BC Managed Care – PPO | Source: Ambulatory Visit | Attending: Interventional Radiology | Admitting: Interventional Radiology

## 2011-01-02 DIAGNOSIS — E041 Nontoxic single thyroid nodule: Secondary | ICD-10-CM

## 2011-01-02 DIAGNOSIS — E042 Nontoxic multinodular goiter: Secondary | ICD-10-CM

## 2011-01-02 DIAGNOSIS — E049 Nontoxic goiter, unspecified: Secondary | ICD-10-CM | POA: Insufficient documentation

## 2011-01-03 ENCOUNTER — Ambulatory Visit (INDEPENDENT_AMBULATORY_CARE_PROVIDER_SITE_OTHER): Payer: BC Managed Care – PPO | Admitting: Thoracic Surgery

## 2011-01-03 DIAGNOSIS — D381 Neoplasm of uncertain behavior of trachea, bronchus and lung: Secondary | ICD-10-CM

## 2011-01-04 NOTE — Letter (Signed)
January 03, 2011  Ruthe Mannan, M.D. [address] [place], [place]  [address]  Re:  Coen, Windell Moulding               DOB:  [birth year]  I saw the patient back after a PET scan, fortunately negative which goes along with  possibly just some ground-glass opacities at the best case , at worse case could be slow growing cancer,  adenocarcinoma.  Given this, we can safely follow this.  I will plan to see her back in 4 months with a repeat CT scan as she had a thyroid biopsy done yesterday and the results are pending.  Her blood pressure is 132/76, pulse 80, respirations 18, sats were 97%.  Ines Bloomer, M.D. Electronically Signed  DPB/MEDQ  D:  01/03/2011  T:  01/04/2011  Job:  [id number]

## 2011-01-11 ENCOUNTER — Other Ambulatory Visit: Payer: BC Managed Care – PPO

## 2011-01-11 ENCOUNTER — Other Ambulatory Visit: Payer: Self-pay | Admitting: Gastroenterology

## 2011-01-11 DIAGNOSIS — Z0000 Encounter for general adult medical examination without abnormal findings: Secondary | ICD-10-CM

## 2011-01-12 ENCOUNTER — Other Ambulatory Visit: Payer: BC Managed Care – PPO | Admitting: Gastroenterology

## 2011-01-12 LAB — HEMOCCULT SLIDES (X 3 CARDS)
OCCULT 1: NEGATIVE
OCCULT 5: NEGATIVE

## 2011-03-28 ENCOUNTER — Other Ambulatory Visit: Payer: Self-pay | Admitting: Thoracic Surgery

## 2011-03-28 DIAGNOSIS — D381 Neoplasm of uncertain behavior of trachea, bronchus and lung: Secondary | ICD-10-CM

## 2011-05-09 ENCOUNTER — Encounter: Payer: Self-pay | Admitting: Thoracic Surgery

## 2011-05-09 ENCOUNTER — Ambulatory Visit
Admission: RE | Admit: 2011-05-09 | Discharge: 2011-05-09 | Disposition: A | Discharge status: Home / Self Care | Payer: BC Managed Care – PPO | Source: Ambulatory Visit | Attending: Thoracic Surgery | Admitting: Thoracic Surgery

## 2011-05-09 ENCOUNTER — Ambulatory Visit (INDEPENDENT_AMBULATORY_CARE_PROVIDER_SITE_OTHER): Payer: BC Managed Care – PPO | Admitting: Thoracic Surgery

## 2011-05-09 VITALS — BP 140/80 | HR 72 | Resp 20 | Ht 63.0 in | Wt 140.0 lb

## 2011-05-09 DIAGNOSIS — D381 Neoplasm of uncertain behavior of trachea, bronchus and lung: Secondary | ICD-10-CM

## 2011-05-09 DIAGNOSIS — R918 Other nonspecific abnormal finding of lung field: Secondary | ICD-10-CM

## 2011-05-09 NOTE — Progress Notes (Signed)
HPI patient returns for followup CT scan. CT scan showed no evidence of a change in the small groundglass nodules. We'll see her again in 6 months with another CT scan. Her medical status is unchanged.   Current Outpatient Prescriptions  Medication Sig Dispense Refill  . Calcium Carb-Cholecalciferol (EQL CALCIUM/VITAMIN D) 600-400 MG-UNIT TABS Take by mouth daily.        Marland Kitchen esomeprazole (NEXIUM) 40 MG capsule Take 1 capsule (40 mg total) by mouth 2 (two) times daily.  60 capsule  11  . famotidine (PEPCID) 10 MG tablet Take 10 mg by mouth 2 (two) times daily.        . fexofenadine (ALLEGRA) 180 MG tablet Take 180 mg by mouth daily.        . fluticasone (FLONASE) 50 MCG/ACT nasal spray Place 2 sprays into the nose daily.       Marland Kitchen glimepiride (AMARYL) 4 MG tablet Take 8 mg by mouth daily.       Marland Kitchen glucose blood (ONE TOUCH TEST STRIPS) test strip 1 each by Other route as needed. Use as instructed       . Insulin Glargine (LANTUS SOLOSTAR [place]) Inject 10 Units into the skin at bedtime.       . insulin glargine (LANTUS) 100 UNIT/ML injection Inject 10 Units into the skin at bedtime.        . Insulin Pen Needle (ULTICARE MINI PEN NEEDLES) 31G X 6 MM MISC by Does not apply route.        Marland Kitchen JANUVIA 100 MG tablet Take 100 mg by mouth daily.       Marland Kitchen lisinopril (PRINIVIL,ZESTRIL) 5 MG tablet Take 5 mg by mouth daily.       . Multiple Vitamin (MULTIVITAMIN) capsule Take 1 capsule by mouth daily.        . ranitidine (ZANTAC) 300 MG capsule Take 300 mg by mouth 2 (two) times daily.        Current Facility-Administered Medications  Medication Dose Route Frequency Provider Last Rate Last Dose  . 0.9 %  sodium chloride infusion  500 mL Intravenous Continuous Eliezer Bottom., MD,FACG         Review of Systems: Unchanged   Physical Exam lungs are clear to auscultation and percussion   Diagnostic Tests: CT scan shows that the small groundglass nodules were unchanged   Impression: Groundglass  nodules   Plan: Return in 6 months with CT scan

## 2011-05-14 ENCOUNTER — Encounter: Payer: Self-pay | Admitting: Family Medicine

## 2011-05-14 ENCOUNTER — Ambulatory Visit (INDEPENDENT_AMBULATORY_CARE_PROVIDER_SITE_OTHER): Payer: BC Managed Care – PPO | Admitting: Family Medicine

## 2011-05-14 ENCOUNTER — Other Ambulatory Visit: Payer: Self-pay | Admitting: Family Medicine

## 2011-05-14 VITALS — BP 142/70 | HR 76 | Temp 98.5°F | Wt 141.5 lb

## 2011-05-14 DIAGNOSIS — E119 Type 2 diabetes mellitus without complications: Secondary | ICD-10-CM

## 2011-05-14 DIAGNOSIS — Z23 Encounter for immunization: Secondary | ICD-10-CM

## 2011-05-14 DIAGNOSIS — E785 Hyperlipidemia, unspecified: Secondary | ICD-10-CM

## 2011-05-14 DIAGNOSIS — I10 Essential (primary) hypertension: Secondary | ICD-10-CM

## 2011-05-14 DIAGNOSIS — Z9189 Other specified personal risk factors, not elsewhere classified: Secondary | ICD-10-CM

## 2011-05-14 DIAGNOSIS — Z9289 Personal history of other medical treatment: Secondary | ICD-10-CM

## 2011-05-14 DIAGNOSIS — R1013 Epigastric pain: Secondary | ICD-10-CM

## 2011-05-14 LAB — BASIC METABOLIC PANEL
BUN: 15 mg/dL (ref 6–23)
Chloride: 102 mEq/L (ref 96–112)
Creatinine, Ser: 1.3 mg/dL — ABNORMAL HIGH (ref 0.4–1.2)
Glucose, Bld: 288 mg/dL — ABNORMAL HIGH (ref 70–99)
Potassium: 4 mEq/L (ref 3.5–5.1)

## 2011-05-14 MED ORDER — VARENICLINE TARTRATE 1 MG PO TABS: 1.0000 mg | ORAL_TABLET | Freq: Two times a day (BID) | ORAL | Status: AC

## 2011-05-14 MED ORDER — ESOMEPRAZOLE MAGNESIUM 40 MG PO CPDR: 40.0000 mg | DELAYED_RELEASE_CAPSULE | Freq: Two times a day (BID) | ORAL | Status: DC

## 2011-05-14 MED ORDER — RANITIDINE HCL 300 MG PO CAPS: 300.0000 mg | ORAL_CAPSULE | Freq: Two times a day (BID) | ORAL | Status: DC

## 2011-05-14 MED ORDER — SITAGLIPTIN PHOSPHATE 100 MG PO TABS: 100.0000 mg | ORAL_TABLET | Freq: Every day | ORAL | Status: DC

## 2011-05-14 MED ORDER — INSULIN GLARGINE 100 UNIT/ML ~~LOC~~ SOLN: 10.0000 [IU] | Freq: Every day | SUBCUTANEOUS | Status: DC

## 2011-05-14 MED ORDER — FLUTICASONE PROPIONATE 50 MCG/ACT NA SUSP: 2.0000 | Freq: Every day | NASAL | Status: DC

## 2011-05-14 MED ORDER — ONETOUCH ULTRA SYSTEM W/DEVICE KIT: 1.0000 | PACK | Freq: Once | Status: DC

## 2011-05-14 MED ORDER — GLUCOSE BLOOD VI STRP: ORAL_STRIP | Status: DC

## 2011-05-14 MED ORDER — GLIMEPIRIDE 4 MG PO TABS: 8.0000 mg | ORAL_TABLET | Freq: Every day | ORAL | Status: DC

## 2011-05-14 MED ORDER — INSULIN PEN NEEDLE 31G X 6 MM MISC: Status: DC

## 2011-05-14 MED ORDER — LISINOPRIL 5 MG PO TABS: 5.0000 mg | ORAL_TABLET | Freq: Every day | ORAL | Status: DC

## 2011-05-14 NOTE — Patient Instructions (Signed)
Good to see you, Audrey Green. Go ahead and increase your Lantus to 12 units nightly. We will call you with your blood work.

## 2011-05-14 NOTE — Progress Notes (Signed)
DM- Lab Results  Component Value Date   HGBA1C 7.8* 03/16/2010   Currently on Lantus 10 units, Januvia 100 mg daily, amaryl 4 mg..  Metformin causes severe GI upset.  Byetta caused severe episodes of hypoglycemia.    CBGs 100s- 200s.  No hypoglycemia.  Mother has been in and out of the hospital, under increased stress. Checking CBGs twice daily.  Wants to be checked for Hep C.  Heard about baby boomers being exposed to hep C, remote h/o blood transfusion.  Patient Active Problem List  Diagnoses  . STREPTOCOCCAL PHARYNGITIS  . Type II or unspecified type diabetes mellitus without mention of complication, not stated as uncontrolled  . HYPERLIPIDEMIA  . ESSENTIAL HYPERTENSION, BENIGN  . HYPERTENSION  . SINUSITIS - ACUTE-NOS  . URI  . ALLERGIC RHINITIS  . GERD  . HYPERLIPIDEMIA, MILD, HX OF  . ABDOMINAL PAIN, EPIGASTRIC  . Gastritis  . Goiter, nodular   Past Medical History  Diagnosis Date  . Allergy   . Diabetes mellitus   . GERD (gastroesophageal reflux disease)   . Hyperlipidemia   . Hypertension   . Pancreatitis   . Osteoporosis   . History of colon polyps    Past Surgical History  Procedure Date  . Abdominal hysterectomy   . Cholecystectomy   . Tonsillectomy   . Hand surgery    History  Substance Use Topics  . Smoking status: Former Smoker    Quit date: 06/11/2008  . Smokeless tobacco: Never Used  . Alcohol Use: No   Family History  Problem Relation Age of Onset  . Breast cancer Mother   . Colon cancer Neg Hx   . Diabetes Mother   . Heart disease Mother    Allergies  Allergen Reactions  . Codeine     REACTION: Hives, tongue swells  . Penicillins     REACTION: rash and itching  . Sulfa Drugs Cross Reactors     itiching   Current Outpatient Prescriptions on File Prior to Visit  Medication Sig Dispense Refill  . Calcium Carb-Cholecalciferol (EQL CALCIUM/VITAMIN D) 600-400 MG-UNIT TABS Take by mouth daily.        Marland Kitchen esomeprazole (NEXIUM) 40 MG  capsule Take 1 capsule (40 mg total) by mouth 2 (two) times daily.  60 capsule  11  . famotidine (PEPCID) 10 MG tablet Take 10 mg by mouth 2 (two) times daily.        . fexofenadine (ALLEGRA) 180 MG tablet Take 180 mg by mouth daily.        . fluticasone (FLONASE) 50 MCG/ACT nasal spray Place 2 sprays into the nose daily.       Marland Kitchen glimepiride (AMARYL) 4 MG tablet Take 8 mg by mouth daily.       Marland Kitchen glucose blood (ONE TOUCH TEST STRIPS) test strip 1 each by Other route as needed. Use as instructed       . Insulin Glargine (LANTUS SOLOSTAR [place]) Inject 10 Units into the skin at bedtime.       . insulin glargine (LANTUS) 100 UNIT/ML injection Inject 10 Units into the skin at bedtime.        . Insulin Pen Needle (ULTICARE MINI PEN NEEDLES) 31G X 6 MM MISC by Does not apply route.        Marland Kitchen JANUVIA 100 MG tablet Take 100 mg by mouth daily.       Marland Kitchen lisinopril (PRINIVIL,ZESTRIL) 5 MG tablet Take 5 mg by mouth daily.       Marland Kitchen  Multiple Vitamin (MULTIVITAMIN) capsule Take 1 capsule by mouth daily.        . ranitidine (ZANTAC) 300 MG capsule Take 300 mg by mouth 2 (two) times daily.        Current Facility-Administered Medications on File Prior to Visit  Medication Dose Route Frequency Provider Last Rate Last Dose  . 0.9 %  sodium chloride infusion  500 mL Intravenous Continuous Eliezer Bottom., MD,FACG       The PMH, PSH, Social History, Family History, Medications, and allergies have been reviewed in [facility], and have been updated if relevant.   Review of Systems       See HPI CV:  Denies chest pain or discomfort. Resp:  Denies shortness of breath.  Physical Exam BP 142/70  Pulse 76  Temp(Src) 98.5 F (36.9 C) (Oral)  Wt 141 lb 8 oz (64.184 kg)  General:  Well-developed,well-nourished,in no acute distress; alert,appropriate and cooperative throughout examination Mouth:  MMM Resp:  CTA bilaterally CVS:  RRR Psych:  Oriented X3 and normally interactive.    Diabetes Management Exam:     Foot  Exam (with socks and/or shoes not present):       Sensory-Pinprick/Light touch:          Left medial foot (L-4): normal          Left dorsal foot (L-5): normal          Left lateral foot (S-1): normal          Right medial foot (L-4): normal       Sensory-Monofilament:          Left foot: normal          Right foot: normal       Inspection:          Left foot: normal          Right foot: normal       Nails:          Left foot: thickened          Right foot: thickened  Assessment and Plan:  1. Type II or unspecified type diabetes mellitus without mention of complication, not stated as uncontrolled   Deteriorated. Will increase Lantus to 12 units qhs. Check a1c today. HgB A1c, Basic Metabolic Panel (BMET)  2. History of transfusion of whole blood  Hepatitis C Antibody

## 2011-05-14 NOTE — Progress Notes (Signed)
Addended by: Gilmer Mor on: 05/14/2011 10:46 AM   Modules accepted: Orders

## 2011-05-15 ENCOUNTER — Other Ambulatory Visit: Payer: Self-pay | Admitting: Family Medicine

## 2011-05-15 DIAGNOSIS — R7689 Other specified abnormal immunological findings in serum: Secondary | ICD-10-CM | POA: Insufficient documentation

## 2011-05-15 DIAGNOSIS — R768 Other specified abnormal immunological findings in serum: Secondary | ICD-10-CM | POA: Insufficient documentation

## 2011-05-15 LAB — HEPATITIS C ANTIBODY: HCV Ab: REACTIVE — AB

## 2011-05-17 ENCOUNTER — Telehealth: Payer: Self-pay | Admitting: *Deleted

## 2011-05-17 NOTE — Telephone Encounter (Signed)
I thought the test was negative! Please call pt and apologize that I gave her wrong information as I thought it was negative. Please have her come in to repeat test. Thank you!

## 2011-05-17 NOTE — Telephone Encounter (Signed)
Left message on machine at home for patient to return call. 

## 2011-05-17 NOTE — Telephone Encounter (Signed)
Patient advised as instructed via telephone.  She will come in tomorrow for lab draw.

## 2011-05-17 NOTE — Telephone Encounter (Signed)
Fyi the HCV qualitative add on test was not performed, there was not enough specimen to add the test.

## 2011-05-18 ENCOUNTER — Other Ambulatory Visit (INDEPENDENT_AMBULATORY_CARE_PROVIDER_SITE_OTHER): Payer: BC Managed Care – PPO

## 2011-05-18 DIAGNOSIS — R768 Other specified abnormal immunological findings in serum: Secondary | ICD-10-CM

## 2011-05-24 ENCOUNTER — Other Ambulatory Visit: Payer: Self-pay | Admitting: *Deleted

## 2011-05-24 ENCOUNTER — Other Ambulatory Visit: Payer: Self-pay | Admitting: Family Medicine

## 2011-05-24 DIAGNOSIS — R768 Other specified abnormal immunological findings in serum: Secondary | ICD-10-CM

## 2011-05-24 NOTE — Telephone Encounter (Signed)
Opened in error

## 2011-07-05 ENCOUNTER — Ambulatory Visit (INDEPENDENT_AMBULATORY_CARE_PROVIDER_SITE_OTHER)
Admission: RE | Admit: 2011-07-05 | Discharge: 2011-07-05 | Disposition: A | Discharge status: Home / Self Care | Payer: BC Managed Care – PPO | Source: Ambulatory Visit | Attending: Family Medicine | Admitting: Family Medicine

## 2011-07-05 ENCOUNTER — Ambulatory Visit: Payer: BC Managed Care – PPO | Admitting: Family Medicine

## 2011-07-05 ENCOUNTER — Ambulatory Visit (INDEPENDENT_AMBULATORY_CARE_PROVIDER_SITE_OTHER): Payer: BC Managed Care – PPO | Admitting: Family Medicine

## 2011-07-05 ENCOUNTER — Ambulatory Visit
Admission: RE | Admit: 2011-07-05 | Discharge: 2011-07-05 | Disposition: A | Discharge status: Home / Self Care | Payer: BC Managed Care – PPO | Source: Ambulatory Visit | Attending: Family Medicine | Admitting: Family Medicine

## 2011-07-05 ENCOUNTER — Encounter: Payer: Self-pay | Admitting: Family Medicine

## 2011-07-05 VITALS — BP 150/80 | HR 90 | Temp 97.6°F | Ht 63.0 in | Wt 143.1 lb

## 2011-07-05 DIAGNOSIS — M25539 Pain in unspecified wrist: Secondary | ICD-10-CM

## 2011-07-05 DIAGNOSIS — M25562 Pain in left knee: Secondary | ICD-10-CM

## 2011-07-05 DIAGNOSIS — M25531 Pain in right wrist: Secondary | ICD-10-CM

## 2011-07-05 DIAGNOSIS — M25569 Pain in unspecified knee: Secondary | ICD-10-CM

## 2011-07-05 MED ORDER — DICLOFENAC SODIUM 75 MG PO TBEC: 75.0000 mg | DELAYED_RELEASE_TABLET | Freq: Two times a day (BID) | ORAL | Status: DC

## 2011-07-05 NOTE — Progress Notes (Signed)
  Patient Name: Audrey Green Date of Birth: July 07, 1945 Age: 66 y.o. Medical Record Number: [id number] Gender: female Date of Encounter: 07/05/2011  History of Present Illness:  Audrey Green is a 66 y.o. very pleasant female patient who presents with the following:  Larey Seat and and tripped and landed out and landed on her left knee. Knee left hurts.  R wrist also hurts  The patient fell on her out stretched hand on the right wrist and also banged her knee 8 days ago. She continues to have some pain in the right wrist more at the base near her distal radius and around in the carpal region, radiocarpal region.  The greatest pain is in her left knee, which he also struck. She is able to flex her knee. She has not been having any symptomatic giving way or locking up. No prior fractures, significant trauma, or surgery.  Past Medical History, Surgical History, Social History, Family History, Problem List, Medications, and Allergies have been reviewed and updated if relevant.  Review of Systems:  GEN: No fevers, chills. Nontoxic. Primarily MSK c/o today. MSK: Detailed in the HPI GI: tolerating PO intake without difficulty Neuro: No numbness, parasthesias, or tingling associated. Otherwise the pertinent positives of the ROS are noted above.    Physical Examination: Filed Vitals:   07/05/11 1016  BP: 150/80  Pulse: 90  Temp: 97.6 F (36.4 C)  TempSrc: Oral  Height: 5\' 3"  (1.6 m)  Weight: 143 lb 1.9 oz (64.919 kg)  SpO2: 99%    Body mass index is 25.35 kg/(m^2).   GEN: WDWN, NAD, Non-toxic, Alert & Oriented x 3 HEENT: Atraumatic, Normocephalic.  Ears and Nose: No external deformity. EXTR: No clubbing/cyanosis/edema NEURO: Normal gait.  PSYCH: Normally interactive. Conversant. Not depressed or anxious appearing.  Calm demeanor.   Knee:  LEFT Gait: Normal heel toe pattern ROM: 0-125 Effusion: neg Echymosis or edema: none Patellar tendon NT Painful PLICA: neg Patellar grind:  negative Medial and lateral patellar facet loading: negative medial and lateral joint lines: minimally tender, distal femoral medial condyle mildly ttp Mcmurray's neg Flexion-pinch mildly painful Varus and valgus stress: stable Lachman: neg Ant and Post drawer: neg Hip abduction, IR, ER: WNL Hip flexion str: 5/5 Hip abd: 5/5 Quad: 5/5 VMO atrophy:No Hamstring concentric and eccentric: 5/5   Right wrist: Grossly nontender throughout all digits and metacarpals. Nontender along radius and ulna. Mild tenderness at the scaphoid. Nontender with motion of the first metacarpal. Nontender at the hook of the hamate. Negative axial load test  Assessment and Plan: 1. Right wrist pain  DG Wrist Complete Right, DG Wrist Navic Only Right  2. Left knee pain  DG Knee Complete 4 Views Left    Xrays, Wrist, 3 Views: AP, Lateral, and Oblique, Navicular Hand x-rayed: R Indication: wrist pain Findings: No evidence of acute fracture or dislocation.  X-rays, left knee, for 4 view trauma series. Indication: Trauma and pain Findings: No evidence for fracture or dislocation. No significant osteoarthritic findings.  I suspect that the patient probably has a significant bone contusion on her knee. I reassured her.  She does have some pain in her wrist in the radiocarpal region. Some mild pain in her snuff box. Placed her in a thumb spica splint and will recheck in one week.

## 2011-07-05 NOTE — Patient Instructions (Signed)
F/u 1 week with Dr. Patsy Lager

## 2011-07-12 ENCOUNTER — Ambulatory Visit (INDEPENDENT_AMBULATORY_CARE_PROVIDER_SITE_OTHER): Payer: BC Managed Care – PPO | Admitting: Family Medicine

## 2011-07-12 ENCOUNTER — Ambulatory Visit (INDEPENDENT_AMBULATORY_CARE_PROVIDER_SITE_OTHER)
Admission: RE | Admit: 2011-07-12 | Discharge: 2011-07-12 | Disposition: A | Discharge status: Home / Self Care | Payer: BC Managed Care – PPO | Source: Ambulatory Visit | Attending: Family Medicine | Admitting: Family Medicine

## 2011-07-12 ENCOUNTER — Ambulatory Visit: Payer: BC Managed Care – PPO | Admitting: Family Medicine

## 2011-07-12 ENCOUNTER — Encounter: Payer: Self-pay | Admitting: Family Medicine

## 2011-07-12 VITALS — BP 130/70 | HR 86 | Temp 97.9°F | Ht 63.0 in | Wt 145.0 lb

## 2011-07-12 DIAGNOSIS — M25539 Pain in unspecified wrist: Secondary | ICD-10-CM

## 2011-07-12 DIAGNOSIS — S62109A Fracture of unspecified carpal bone, unspecified wrist, initial encounter for closed fracture: Secondary | ICD-10-CM

## 2011-07-12 DIAGNOSIS — S62101A Fracture of unspecified carpal bone, right wrist, initial encounter for closed fracture: Secondary | ICD-10-CM | POA: Insufficient documentation

## 2011-07-12 NOTE — Progress Notes (Signed)
  Patient Name: Audrey Green Date of Birth: February 18, 1946 Age: 66 y.o. Medical Record Number: [id number] Gender: female Date of Encounter: 07/12/2011  History of Present Illness:  Audrey Green is a 66 y.o. very pleasant female patient who presents with the following:  Followup right wrist pain. The patient has been immobilized in a thumb spica splint. She is improving somewhat and has less pain. She has been compliant with wearing her wrist splint.  Past Medical History, Surgical History, Social History, Family History, Problem List, Medications, and Allergies have been reviewed and updated if relevant.  Review of Systems:  GEN: No fevers, chills. Nontoxic. Primarily MSK c/o today. MSK: Detailed in the HPI GI: tolerating PO intake without difficulty Neuro: No numbness, parasthesias, or tingling associated. Otherwise the pertinent positives of the ROS are noted above.    Physical Examination: Filed Vitals:   07/12/11 0810  BP: 130/70  Pulse: 86  Temp: 97.9 F (36.6 C)  TempSrc: Oral  Height: 5\' 3"  (1.6 m)  Weight: 145 lb (65.772 kg)  SpO2: 98%    Body mass index is 25.69 kg/(m^2).   GEN: WDWN, NAD, Non-toxic, Alert & Oriented x 3 HEENT: Atraumatic, Normocephalic.  Ears and Nose: No external deformity. EXTR: No clubbing/cyanosis/edema NEURO: Normal gait.  PSYCH: Normally interactive. Conversant. Not depressed or anxious appearing.  Calm demeanor.   Full range of motion at the wrist with no tenderness along the phalanges, carpals, metacarpals. Mild tenderness with radial deviation. Nontender at the distal radius and ulna and full range of motion at the elbow without difficulty. No bruising or swelling.  Assessment and Plan:  Right wrist pain. No evidence of acute fracture. She is improving. Continue with thumb spica splint over the next 2-3 weeks, then wean out. If she is not improved in 3-4 weeks, she is to followup. I reassured her, and suspect that there is a component of bone  bruising.  Xrays, Wrist, 3 Views: AP, Lateral, and Oblique Hand x-rayed: Indication: wrist pain Findings: No evidence of acute fracture or dislocation.

## 2011-08-30 ENCOUNTER — Ambulatory Visit: Payer: BC Managed Care – PPO | Admitting: Gastroenterology

## 2011-09-06 ENCOUNTER — Ambulatory Visit: Payer: BC Managed Care – PPO | Admitting: Gastroenterology

## 2011-09-19 ENCOUNTER — Other Ambulatory Visit: Payer: Self-pay | Admitting: Thoracic Surgery

## 2011-09-19 DIAGNOSIS — D381 Neoplasm of uncertain behavior of trachea, bronchus and lung: Secondary | ICD-10-CM

## 2011-09-24 ENCOUNTER — Other Ambulatory Visit: Payer: Self-pay | Admitting: Orthopedic Surgery

## 2011-09-27 ENCOUNTER — Ambulatory Visit (INDEPENDENT_AMBULATORY_CARE_PROVIDER_SITE_OTHER): Payer: BC Managed Care – PPO | Admitting: Gastroenterology

## 2011-09-27 DIAGNOSIS — B182 Chronic viral hepatitis C: Secondary | ICD-10-CM

## 2011-09-28 ENCOUNTER — Encounter (HOSPITAL_BASED_OUTPATIENT_CLINIC_OR_DEPARTMENT_OTHER)
Admission: RE | Disposition: A | Discharge status: Home / Self Care | Payer: Self-pay | Source: Ambulatory Visit | Attending: Orthopedic Surgery

## 2011-09-28 ENCOUNTER — Ambulatory Visit (HOSPITAL_BASED_OUTPATIENT_CLINIC_OR_DEPARTMENT_OTHER)
Admission: RE | Admit: 2011-09-28 | Discharge: 2011-09-28 | Disposition: A | Discharge status: Home / Self Care | Payer: BC Managed Care – PPO | Source: Ambulatory Visit | Attending: Orthopedic Surgery | Admitting: Orthopedic Surgery

## 2011-09-28 DIAGNOSIS — M654 Radial styloid tenosynovitis [de Quervain]: Secondary | ICD-10-CM | POA: Insufficient documentation

## 2011-09-28 DIAGNOSIS — I10 Essential (primary) hypertension: Secondary | ICD-10-CM | POA: Insufficient documentation

## 2011-09-28 LAB — CBC WITH DIFFERENTIAL/PLATELET
Eosinophils Absolute: 0.3 10*3/uL (ref 0.0–0.7)
Eosinophils Relative: 3 % (ref 0–5)
HCT: 43.4 % (ref 36.0–46.0)
Hemoglobin: 14.1 g/dL (ref 12.0–15.0)
Lymphs Abs: 4.8 10*3/uL — ABNORMAL HIGH (ref 0.7–4.0)
MCH: 28.5 pg (ref 26.0–34.0)
MCV: 87.9 fL (ref 78.0–100.0)
Monocytes Absolute: 0.6 10*3/uL (ref 0.1–1.0)
Monocytes Relative: 6 % (ref 3–12)
Platelets: 410 10*3/uL — ABNORMAL HIGH (ref 150–400)
RBC: 4.94 MIL/uL (ref 3.87–5.11)

## 2011-09-28 LAB — HEPATITIS B CORE ANTIBODY, TOTAL: Hep B Core Total Ab: POSITIVE — AB

## 2011-09-28 LAB — COMPLETE METABOLIC PANEL WITH GFR
CO2: 27 mEq/L (ref 19–32)
Calcium: 9.9 mg/dL (ref 8.4–10.5)
Chloride: 102 mEq/L (ref 96–112)
Creat: 1.58 mg/dL — ABNORMAL HIGH (ref 0.50–1.10)
GFR, Est African American: 40 mL/min — ABNORMAL LOW
GFR, Est Non African American: 35 mL/min — ABNORMAL LOW
Glucose, Bld: 198 mg/dL — ABNORMAL HIGH (ref 70–99)
Total Bilirubin: 0.4 mg/dL (ref 0.3–1.2)

## 2011-09-28 LAB — IRON: Iron: 110 ug/dL (ref 42–145)

## 2011-09-28 LAB — PROTIME-INR: INR: 0.93 (ref <1.50)

## 2011-09-28 LAB — IBC PANEL: TIBC: 383 ug/dL (ref 250–470)

## 2011-09-28 LAB — HEPATITIS A ANTIBODY, TOTAL: Hep A Total Ab: NEGATIVE

## 2011-09-28 LAB — HEPATITIS B SURFACE ANTIBODY,QUALITATIVE: Hep B S Ab: NEGATIVE

## 2011-09-28 LAB — TSH: TSH: 0.864 u[IU]/mL (ref 0.350–4.500)

## 2011-09-28 SURGERY — MINOR RELEASE DORSAL COMPARTMENT (DEQUERVAINS)
Anesthesia: LOCAL | Site: Arm Lower | Laterality: Right | Wound class: Clean

## 2011-09-28 MED ORDER — LIDOCAINE HCL 2 % IJ SOLN
INTRAMUSCULAR | Status: DC | PRN
  Administered 2011-09-28: 2 mL

## 2011-09-28 MED ORDER — TRAMADOL HCL 50 MG PO TABS: ORAL_TABLET | ORAL | Status: AC

## 2011-09-28 MED ORDER — CHLORHEXIDINE GLUCONATE 4 % EX LIQD: 60.0000 mL | Freq: Once | CUTANEOUS | Status: DC

## 2011-09-28 SURGICAL SUPPLY — 35 items
BANDAGE ELASTIC 3 VELCRO ST LF (GAUZE/BANDAGES/DRESSINGS) ×2 IMPLANT
BLADE MINI RND TIP GREEN BEAV (BLADE) IMPLANT
BLADE SURG 15 STRL LF DISP TIS (BLADE) ×1 IMPLANT
BLADE SURG 15 STRL SS (BLADE) ×1
BRUSH SCRUB EZ PLAIN DRY (MISCELLANEOUS) ×2 IMPLANT
CLOTH BEACON ORANGE TIMEOUT ST (SAFETY) ×2 IMPLANT
CORDS BIPOLAR (ELECTRODE) IMPLANT
COVER MAYO STAND STRL (DRAPES) ×2 IMPLANT
COVER TABLE BACK 60X90 (DRAPES) ×2 IMPLANT
CUFF TOURNIQUET SINGLE 18IN (TOURNIQUET CUFF) ×2 IMPLANT
DECANTER SPIKE VIAL GLASS SM (MISCELLANEOUS) IMPLANT
DRAPE EXTREMITY T 121X128X90 (DRAPE) ×2 IMPLANT
DRAPE SURG 17X23 STRL (DRAPES) ×2 IMPLANT
DRSG TEGADERM 4X4.75 (GAUZE/BANDAGES/DRESSINGS) ×2 IMPLANT
GLOVE BIO SURGEON STRL SZ 6.5 (GLOVE) ×2 IMPLANT
GLOVE BIOGEL M STRL SZ7.5 (GLOVE) ×2 IMPLANT
GLOVE ORTHO TXT STRL SZ7.5 (GLOVE) ×2 IMPLANT
GOWN PREVENTION PLUS XLARGE (GOWN DISPOSABLE) ×2 IMPLANT
GOWN PREVENTION PLUS XXLARGE (GOWN DISPOSABLE) ×4 IMPLANT
NEEDLE 27GAX1X1/2 (NEEDLE) ×2 IMPLANT
PACK BASIN DAY SURGERY FS (CUSTOM PROCEDURE TRAY) ×2 IMPLANT
PAD CAST 3X4 CTTN HI CHSV (CAST SUPPLIES) IMPLANT
PADDING CAST ABS 4INX4YD NS (CAST SUPPLIES) ×1
PADDING CAST ABS COTTON 4X4 ST (CAST SUPPLIES) ×1 IMPLANT
PADDING CAST COTTON 3X4 STRL (CAST SUPPLIES)
SPONGE GAUZE 4X4 12PLY (GAUZE/BANDAGES/DRESSINGS) ×2 IMPLANT
STOCKINETTE 4X48 STRL (DRAPES) ×2 IMPLANT
STRIP CLOSURE SKIN 1/2X4 (GAUZE/BANDAGES/DRESSINGS) ×2 IMPLANT
SUT PROLENE 3 0 PS 2 (SUTURE) ×2 IMPLANT
SUT VIC AB 4-0 P-3 18XBRD (SUTURE) IMPLANT
SUT VIC AB 4-0 P3 18 (SUTURE)
SYR 3ML 23GX1 SAFETY (SYRINGE) IMPLANT
SYR CONTROL 10ML LL (SYRINGE) ×2 IMPLANT
TRAY DSU PREP LF (CUSTOM PROCEDURE TRAY) ×2 IMPLANT
UNDERPAD 30X30 INCONTINENT (UNDERPADS AND DIAPERS) ×2 IMPLANT

## 2011-09-28 NOTE — Discharge Instructions (Signed)

## 2011-09-28 NOTE — H&P (Signed)
  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 Pensions consultant. She presents for evaluation of a chronically painful right wrist. On 06-25-11 while playing with her [employer], she tripped over the dog landing hard onto her right hand. She suffered an injury to her thumb and wrist. She was seen by her family physician where x-rays revealed no fracture. She was advised to begin use of a thumb spica splint. She has had persistent pain over the radial styloid region. She has significant difficulty performing radial and ulnar deviation movement of her wrist. She now seeks an upper extremity orthopaedic consult.  Ms. Slee past medical history is reviewed in detail. She is allergic to Codeine and PCN. She has severe rash formation with exposure to the medications. She also has a rash after exposure to steroids. She has had type II diabetes since 1996. She uses Lantus at bedtime and takes Glimepiride 4 mg daily and Januvia 100 mg daily.  She takes Lisinopril 5 mg for elevated BP, Ranitidine 300 mg daily. Prior surgery includes cholecystectomy in 2008, carpal tunnel release in 1996, and hysterectomy in 1976. Her social history reveals she is single. She is a nonsmoker and does not drink alcoholic beverages. Her family history is detailed and positive for diabetes, coronary artery disease, hypertension and arthritis affecting multiple primary relatives. A 14 system review of systems reveals corrective lenses 1984 to present, hypertension 1996 to present. She has a history of Hepatitis exposure but has not had sero-conversion. She has had easy bleeding since she "became diabetic".   Physical exam reveals a very pleasant 67 year old woman. Inspection of her hands and wrists reveals obvious swelling of her first dorsal compartment. She is markedly tender on palpation over the first dorsal compartment. She has full ROM of her fingers in flexion/extension. She does not trigger her thumb at the IP  joint and is nontender over the A-1 pulley. She has pain over the first dorsal compartment. Her Finkelstein's is remarkably painful.  Assessment: Chronic STS right first dorsal compartment.  We will schedule her for release of the first dorsal compartment under local anesthesia. The surgery, after care, risks and benefits were described in detail.  H&P documentation: 09/28/2011  -History and Physical Reviewed  -Patient has been re-examined  -No change in the plan of care  Wyn Forster, MD

## 2011-09-28 NOTE — Brief Op Note (Signed)
09/28/2011  11:38 AM  PATIENT:  Audrey Green  66 y.o. female  PRE-OPERATIVE DIAGNOSIS:  sts of right wrist first dorsal compartment  POST-OPERATIVE DIAGNOSIS:  sts of right wrist first dorsal compartment  PROCEDURE:  RELEASE DORSAL COMPARTMENT (DEQUERVAINS) (Right)  SURGEON:  Wyn Forster., MD   PHYSICIAN ASSISTANT:   ASSISTANTS: Mallory Shirk.A-C   ANESTHESIA:   local  EBL:     BLOOD ADMINISTERED:none  DRAINS: none   LOCAL MEDICATIONS USED:  XYLOCAINE   SPECIMEN:  No Specimen  DISPOSITION OF SPECIMEN:  N/A  COUNTS:  YES  TOURNIQUET:   Total Tourniquet Time Documented: Upper Arm (Right) - 7 minutes  DICTATION: .Other Dictation: Dictation Number [id number]  PLAN OF CARE: Discharge to home after PACU  PATIENT DISPOSITION:  PACU - hemodynamically stable.

## 2011-09-28 NOTE — Op Note (Signed)
Op note dictated:  [id number]

## 2011-09-30 NOTE — Op Note (Signed)
NAME:  Audrey Green, Audrey Green                        ACCOUNT NO.:  MEDICAL RECORD NO.:  [id number]  LOCATION:                                 FACILITY:  PHYSICIAN:  Katy Fitch. Sharia Averitt, M.D.      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  OPERATION:  Release of right first dorsal compartment under local anesthesia with resection of septum.  OPERATING SURGEON:  Katy Fitch. Nayomi Tabron, MD  ASSISTANT:  Annye Rusk, PA-C  ANESTHESIA:  Lidocaine 2% field blocks without supplemental IV sedation, this was a minor operating room procedure.  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  She has moved to [facility] to care for her 26 year old mother.  She has had a history of chronic stenosing tenosynovitis of her right first dorsal compartment.  This has been unresponsive to splinting and activity modification.  She had very severe thickening of the wall.  We advised to proceed with release of the compartment local anesthesia.  After informed consent, she is brought to the operating room at this time.  PROCEDURE:  Audrey Green was brought to room 1 of the [facility] and placed in supine position on the operating table.  Following informed consent and Betadine prep, 2% lidocaine was infiltrated in the path of intended incision and into the first dorsal compartment.  Within 5 minutes, she had excellent anesthesia on the dorsum of the thumb and in the region of the anticipated surgery.  The right hand and arm were prepped with Betadine soap and solution and sterilely draped.   A pneumatic tourniquet was applied to the proximal right brachium.  Following exsanguination of the right arm with Esmarch bandage, the arterial tourniquet was inflated to 220 mmHg.  After routine surgical time-out, procedure commenced with a transverse incision directly over the apex of the first dorsal compartment.  Subcutaneous tissues were carefully divided taking care to gently elevate the adherent radial superficial sensory branches from the first dorsal compartment with a Therapist, nutritional.  Four Ragnell retractors were placed followed by careful splitting of the compartment.  The wall thickness was more than 2 mm. There was a thick septum between the abductor pollicis longus and extensor pollicis brevis tendons.  This was removed with a rongeur. Thereafter, free range of motion of the wrist, thumb, metacarpal and MP joint were recovered.  The wound was repaired with intradermal 3-0 Prolene with Steri-Strips.  There were no apparent complications.  For aftercare, Audrey Green was advised to use naproxen sodium or Advil as needed for pain.  If she has more severe pain, she has a prescription for Ultram 50 mg, 1 p.o. q.4 h. p.r.n. pain.     Katy Fitch Railynn Ballo, M.D.     RVS/MEDQ  D:  09/28/2011  T:  09/28/2011  Job:  [id number]  cc:   [employer]

## 2011-10-01 ENCOUNTER — Encounter (HOSPITAL_BASED_OUTPATIENT_CLINIC_OR_DEPARTMENT_OTHER): Payer: Self-pay

## 2011-10-04 NOTE — Progress Notes (Addendum)
Audrey Green, Audrey Green    MR#:  [id number]      DATE:  09/27/2011  DOB:  [birth year]    cc: Primary Care Physician: Same. Referring Physician: Ruthe Mannan, [facility] [address] [place], [place] [address], [place] [phone]    reason for referral:  Positive HCV RNA.   history: The patient is a 66 year old woman who I have been asked to see in consultation by Dr. Dayton Martes regarding a positive HCV RNA.   According to the patient, she had no history of liver disease. When she attended her primary physician, Dr. Ruthe Mannan, on 05/14/2011, in follow up of her diabetes, she asked to be tested for hepatitis C because she had heard about the new CDC screening guidelines based on date of birth. At her request, a hepatitis C antibody was done on 05/14/2011, which was positive. This led to an HCV RNA being done on 05/18/2011, which was detectable. She was not genotyped.   There are no symptoms referable to her history of hepatitis C nor are there symptoms to suggest decompensated or cryoglobulin mediated disease.   With respect to risk factors for liver disease, she rarely drank alcohol in the past. There is no history of intravenous or intranasal drug use, tattoos or unsterile body piercing. 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 There is no family history of liver disease. She cannot recall being vaccinated against hepatitis A or B.   PAST MEDICAL HISTORY:  Significant for type 2 diabetes diagnosed in 1996. She checks her blood sugars on occasion. Her last hemoglobin A1c, that I see, was 8.8% on 05/14/2011. She denies any history of coronary artery disease, but has a history of hypertension and dyslipidemia. There is no history of thyroid disease.   PAST SURGICAL HISTORY:  Right groin surgery at the age of 61 to remove some sort of mass. At the age of 39, she underwent  a resection of an ovarian cyst. By her early 84s, she had another ovarian cyst removed. Because of ongoing difficulty with ovarian cysts, she had a hysterectomy at the age of 62. She also had a tonsillectomy and adenoidectomy at the age of 74. Cholecystectomy in 2008, right carpal tunnel release 2007.  past psychiatric history:   Denies.  Current medications:  1. Lisinopril 5 mg daily. 2. Januvia 100 mg daily. 3. Glimepiride 4 mg b.i.d. 4. Nexium 40 mg b.i.d. 5. Ranitidine 300 mg b.i.d. 6. Pepcid p.r.n. 7. Vitamin D once daily. 8. Multivitamin once daily. 9. Fluticasone 2 sprays p.r.n. 10. Zyrtec once daily p.r.n. 11. [facility] Colon Health once daily.  Allergies:  Codeine and penicillin cause rashes.  habits:  Smoking, denies. Alcohol as above.   family history:  As above.   social history:  Single without any children. The patient cares for her elderly mother. 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   REVIEW OF SYSTEMS: All 10 systems were reviewed today with the patient and they are negative other than as mentioned above. CES-D was 1.  PHYSICAL EXAMINATION:  Constitutional:  Stated age without significant peripheral wasting.  Vital signs: Height 63 inches, weight 135 pounds, blood pressure 114/77, pulse 94, temperature 97.9 Fahrenheit.  Ears, Nose, Mouth and Throat:  Unremarkable oropharynx.  No thyromegaly or neck masses.  Chest:  Resonant  to percussion.  Clear to auscultation.  Cardiovascular:  Heart sounds normal S1, S2 without murmurs or rubs.  There is no peripheral edema.  Abdomen:  Normal bowel sounds.  No masses or tenderness.  I could not appreciate a liver edge or spleen tip.  I could not appreciate any hernias.  Lymphatics:  No cervical or inguinal lymphadenopathy.  Central Nervous System:  No asterixis or focal neurologic findings.  Dermatologic:  Anicteric without palmar erythema or spider angiomata.   Eyes:  Anicteric sclerae.  Pupils are equal and reactive to light.  laboratories:  Previous labs within EPIC reviewed. On 05/18/2011, her qualitative HCV RNA was positive. On 05/14/2011, her creatinine was 1.3. CT of the abdomen on 12/12/2010, showed an unremarkable liver.   assessment:  The patient is a 66 year old woman with a history of a positive hepatitis C antibody on 05/14/2011, leading to a positive qualitative HCV RNA. She has not been genotyped. I do not see any contraindications to treating her hepatitis C other than that I would like to see better control of her type 2 diabetes. I do not think this represents an absolute contraindication to treating her however.   In my discussion today with the patient, I discussed the nature and natural history of hepatitis C. We discussed the need to genotype her. We discussed the significance of each genotype. We discussed the role of a liver biopsy for genotype 1. We discussed treatment with pegylated interferon and ribavirin for all genotypes and the addition of a protease inhibitor for genotype 1. I reviewed the treatment with pegylated interferon and ribavirin for all genotypes and the addition of a protease inhibitor if genotype 1. We discussed the specific systems, constitutional, and psychiatric side effects of therapy. I discussed the treatment durations and response rates. I also discussed the risk of contagion. I specifically emphasized the fact that her diabetes control could be worsened by treatment and this would in turn impact on her response rate.   I discussed the possible participation in clinical trials if her diabetes does not preclude this. I discussed the fact that participation is voluntary. I discussed the fact that we do not know the efficacy and side effects of all the agents in these protocols. The patient is interested in receiving a call should she be eligible.   plan:  1. Genotype.  2. Standard labs otherwise.  3. Test for  hepatitis A and B immunity. 4. If genotype 1, proceed with liver biopsy, thereafter arrange for followup depending on the results.  5. If genotype 2-3, may decide to proceed directly to treatment.  6. Literature on hepatitis C given.  7. Will pass her name on to the research coordinators to see if they are interested in possibly having her enroll in a protocol.              Brooke Dare, MD   ADDENDUM Genotype 1b.  Hep A and B nave.   Will book a liver biopsy.  403 .[id number]  D:  Thu Apr 18 18:39:58 2013 ; T:  Thu Apr 18 23:47:38 2013  Job #:  [id number]

## 2011-10-04 NOTE — Progress Notes (Signed)
Addended by: Brooke Dare on: 10/04/2011 09:03 PM   Modules accepted: Orders

## 2011-10-10 ENCOUNTER — Other Ambulatory Visit: Payer: Self-pay | Admitting: Physician Assistant

## 2011-10-12 ENCOUNTER — Encounter (HOSPITAL_COMMUNITY): Payer: Self-pay | Admitting: Pharmacy Technician

## 2011-10-15 ENCOUNTER — Ambulatory Visit (HOSPITAL_COMMUNITY)
Admission: RE | Admit: 2011-10-15 | Discharge: 2011-10-15 | Disposition: A | Discharge status: Home / Self Care | Payer: BC Managed Care – PPO | Source: Ambulatory Visit | Attending: Gastroenterology | Admitting: Gastroenterology

## 2011-10-15 ENCOUNTER — Encounter (HOSPITAL_COMMUNITY): Payer: Self-pay

## 2011-10-15 VITALS — BP 135/67 | HR 67 | Temp 96.8°F | Resp 20 | Ht 63.0 in | Wt 137.0 lb

## 2011-10-15 DIAGNOSIS — B182 Chronic viral hepatitis C: Secondary | ICD-10-CM

## 2011-10-15 DIAGNOSIS — B1920 Unspecified viral hepatitis C without hepatic coma: Secondary | ICD-10-CM | POA: Insufficient documentation

## 2011-10-15 HISTORY — DX: Inflammatory liver disease, unspecified: K75.9

## 2011-10-15 LAB — CBC
HCT: 38.5 % (ref 36.0–46.0)
Hemoglobin: 12.9 g/dL (ref 12.0–15.0)
MCHC: 33.5 g/dL (ref 30.0–36.0)
RBC: 4.48 MIL/uL (ref 3.87–5.11)

## 2011-10-15 LAB — PROTIME-INR: INR: 0.89 (ref 0.00–1.49)

## 2011-10-15 LAB — APTT: aPTT: 32 seconds (ref 24–37)

## 2011-10-15 MED ORDER — HYDROCODONE-ACETAMINOPHEN 5-325 MG PO TABS
1.0000 | ORAL_TABLET | ORAL | Status: DC | PRN
  Administered 2011-10-15: 2 via ORAL

## 2011-10-15 MED ORDER — MIDAZOLAM HCL 2 MG/2ML IJ SOLN
INTRAMUSCULAR | Status: AC
  Filled 2011-10-15: qty 4

## 2011-10-15 MED ORDER — MIDAZOLAM HCL 5 MG/5ML IJ SOLN
INTRAMUSCULAR | Status: AC | PRN
  Administered 2011-10-15: 1 mg via INTRAVENOUS
  Administered 2011-10-15: 0.5 mg via INTRAVENOUS

## 2011-10-15 MED ORDER — SODIUM CHLORIDE 0.9 % IV SOLN
INTRAVENOUS | Status: AC | PRN
  Administered 2011-10-15: 75 mL/h via INTRAVENOUS

## 2011-10-15 MED ORDER — SODIUM CHLORIDE 0.9 % IV SOLN: INTRAVENOUS | Status: DC

## 2011-10-15 MED ORDER — FENTANYL CITRATE 0.05 MG/ML IJ SOLN
INTRAMUSCULAR | Status: AC
  Filled 2011-10-15: qty 4

## 2011-10-15 MED ORDER — FENTANYL CITRATE 0.05 MG/ML IJ SOLN
INTRAMUSCULAR | Status: AC | PRN
  Administered 2011-10-15: 25 ug via INTRAVENOUS
  Administered 2011-10-15: 50 ug via INTRAVENOUS

## 2011-10-15 NOTE — Procedures (Signed)
Ultrasound guided liver biopsy.  3 core samples obtained.  No immediate complication.

## 2011-10-15 NOTE — Discharge Instructions (Signed)
Liver Biopsy A liver biopsy is done to confirm or prove a suspected problem. The liver is a large organ in the upper right hand of your abdomen. To do the test, the doctor puts a small needle into the right side of your abdomen. A tiny piece of liver tissue is taken and sent for testing. This should not be painful as the skin is injected with a local anesthetic that numbs the area.  HOW A BIOPSY IS PERFORMED This is often performed as a same day surgery. This can be done in a hospital or clinic. Biopsies are often done under local anesthesia which makes the area of biopsy numb. Sometimes sedation is given to help patients relax. If you are taking blood thinning medications or medications containing aspirin, this must be discussed with your caregiver before the test. This medication may need to be stopped for up to 7 days before the procedure, or the dose may need to be changed. You should review all of your other medications with your caregiver before the test. You must remain in bed for 1 to 2 hours after the test. Having something to read may help pass the time.  LET YOUR CAREGIVERS KNOW ABOUT THE FOLLOWING:  Allergies.   Medications taken including herbs, eye drops, over -the- counter medications, and creams.   Use of steroids (by mouth or creams).   Previous problems with anesthetics or novocaine.   Possibility of pregnancy, if this applies.   History of blood clots (thrombophlebitis).   History of bleeding or blood problems.   Previous surgery.   Other health problems.  BEFORE THE PROCEDURE You should be present 60 minutes prior to your procedure or as directed. Check in at the admissions desk for filling out necessary forms if not pre-registered. There will be consent forms to sign prior to the procedure. There is a waiting area for your family while you are having your biopsy. AFTER THE PROCEDURE  After your biopsy, you will be taken to the recovery area where a nurse will watch  and check your progress.   You may have to lie on your right side for 1 to 2 hours.   Your blood pressure and pulse will be checked often.   If you are having pain or feel sick, tell your nurse.   After 1 to 2 hours, if you are going home, you may sit in a chair and get dressed. The nurse will let you know when you can get up.   Once you are doing well, barring other problems, you will be allowed to go home. Once at home, putting an ice pack on your operative site may help with discomfort and keep swelling down.   You may resume a normal diet and activities as directed.   Change dressings as directed.   Only take over-the-counter or prescription medicines for pain, discomfort, or fever as directed by your caregiver.   Call for your results as instructed by your caregiver. Remember it is your job to be sure you get the results of your biopsy and any additional tests performed on the sample taken. Do not assume everything is fine if you do not hear from your caregiver.  HOME CARE INSTRUCTIONS   You should rest for one to two days or as instructed.   You will need to have a responsible adult take you home and stay with you overnight.   Do not lift over 5 lbs. or play contact sports for two weeks.     Do not drive for 24 hours.   Do not take medication containing aspirin or drink alcohol for one week after this test.  SEEK MEDICAL CARE IF:   There is increased bleeding (more than a small spot) from the biopsy site.   You have redness, swelling, or increasing pain in the biopsy site.   You develop swelling or pain in the abdomen.   You have an unexplained oral temperature over 102 F (38.9 C).   You notice a foul smell coming from the wound or dressing.  SEEK IMMEDIATE MEDICAL CARE IF:   You develop a rash.   You have difficulty breathing.   You have allergic problems such as itching or swelling or shortness of breath.  Document Released: 08/18/2003 Document Revised:  05/17/2011 Document Reviewed: 01/06/2008 ExitCare Patient Information 2012 [employer]. 

## 2011-10-15 NOTE — ED Notes (Signed)
O2 d/c'd 

## 2011-10-15 NOTE — ED Notes (Signed)
O2 2L/[place] started 

## 2011-10-15 NOTE — H&P (Signed)
Chief Complaint: Hepatitis HPI: Audrey Green is an 66 y.o. female who was recently found to be positive for Hep C on some bloodwork. She has been seen by Infectious Disease who has requested random liver biopsy. She states she has otherwise been well.  Past Medical History:  Past Medical History  Diagnosis Date  . Allergy   . Diabetes mellitus   . GERD (gastroesophageal reflux disease)   . Hyperlipidemia   . Hypertension   . Pancreatitis   . Osteoporosis   . History of colon polyps   . Hepatitis     Past Surgical History:  Past Surgical History  Procedure Date  . Abdominal hysterectomy   . Cholecystectomy   . Tonsillectomy   . Hand surgery     Family History:  Family History  Problem Relation Age of Onset  . Breast cancer Mother   . Colon cancer Neg Hx   . Diabetes Mother   . Heart disease Mother     Social History:  reports that she quit smoking about 3 years ago. She has never used smokeless tobacco. She reports that she does not drink alcohol or use illicit drugs.  Allergies:  Allergies  Allergen Reactions  . Codeine     REACTION: Hives, tongue swells  . Penicillins     REACTION: rash and itching  . Sulfa Drugs Cross Reactors     itiching    Medications: CURRENT MEDICATIONS:  1. Lisinopril 5 mg daily. 2. Januvia 100 mg daily. 3. Glimepiride 4 mg b.i.d. 4. Nexium 40 mg b.i.d. 5. Ranitidine 300 mg b.i.d. 6. Pepcid p.r.n. 7. Vitamin D once daily. 8. Multivitamin once daily. 9. Fluticasone 2 sprays p.r.n. 10. Zyrtec once daily p.r.n. 11. [facility] once daily  ROS: negative 10 system review  Blood pressure 170/81, pulse 68, temperature 96.9 F (36.1 C), temperature source Oral, resp. rate 18, height 5\' 3"  (1.6 m), weight 137 lb (62.143 kg), SpO2 100.00%. Body mass index is 24.27 kg/(m^2).   General Appearance:  Alert, cooperative, no distress, appears stated age  ENT: Unremarkable  Neck: Supple, symmetrical, trachea midline, no adenopathy,  thyroid: not enlarged, symmetric, no tenderness/mass/nodules  Lungs:   Clear to auscultation bilaterally, no w/r/r, respirations unlabored without use of accessory muscles.  Heart:  Regular rate and rhythm, S1, S2 normal, no murmur, rub or gallop. Carotids 2+ without bruit.  Abdomen:   Soft, non-tender, non distended. Bowel sounds active all four quadrants,  no masses, no organomegaly.  Skin: Skin color, texture, turgor normal, no rashes or lesions  Neurologic: Normal affect, no gross deficits.   Results for orders placed during the hospital encounter of 10/15/11 (from the past 48 hour(s))  APTT     Status: Normal   Collection Time   10/15/11  8:47 AM      Component Value Range Comment   aPTT 32  24 - 37 (seconds)   CBC     Status: Normal   Collection Time   10/15/11  8:47 AM      Component Value Range Comment   WBC 8.4  4.0 - 10.5 (K/uL)    RBC 4.48  3.87 - 5.11 (MIL/uL)    Hemoglobin 12.9  12.0 - 15.0 (g/dL)    HCT 34.7  42.5 - 95.6 (%)    MCV 85.9  78.0 - 100.0 (fL)    MCH 28.8  26.0 - 34.0 (pg)    MCHC 33.5  30.0 - 36.0 (g/dL)    RDW 12.1  11.5 - 15.5 (%)    Platelets 295  150 - 400 (K/uL)   PROTIME-INR     Status: Normal   Collection Time   10/15/11  8:47 AM      Component Value Range Comment   Prothrombin Time 12.2  11.6 - 15.2 (seconds)    INR 0.89  0.00 - 1.49     No results found.  Assessment/Plan Hepatitis C For US guided random liver biopsy. Procedure discussed including risks and complications. Consent signed in chart. Labs reviewed, normal.  Mabel Roll PA-C 10/15/2011, 9:25 AM

## 2011-10-16 ENCOUNTER — Telehealth (HOSPITAL_COMMUNITY): Payer: Self-pay | Admitting: *Deleted

## 2011-10-16 NOTE — Telephone Encounter (Signed)
Pt doing well post liver biopsy.  No problems with pain or signs of infection.  Enc to call for problems or questions.

## 2011-10-16 NOTE — Telephone Encounter (Signed)
Post procedure follow up call.  Spoke with pt, says doing fine no problems at this time.

## 2011-10-18 ENCOUNTER — Encounter: Payer: Self-pay | Admitting: Gastroenterology

## 2011-11-07 ENCOUNTER — Ambulatory Visit
Admission: RE | Admit: 2011-11-07 | Discharge: 2011-11-07 | Disposition: A | Discharge status: Home / Self Care | Payer: BC Managed Care – PPO | Source: Ambulatory Visit | Attending: Thoracic Surgery | Admitting: Thoracic Surgery

## 2011-11-07 ENCOUNTER — Encounter: Payer: Self-pay | Admitting: Thoracic Surgery

## 2011-11-07 ENCOUNTER — Ambulatory Visit (INDEPENDENT_AMBULATORY_CARE_PROVIDER_SITE_OTHER): Payer: BC Managed Care – PPO | Admitting: Thoracic Surgery

## 2011-11-07 VITALS — BP 142/68 | HR 69 | Resp 16 | Ht 63.0 in | Wt 140.0 lb

## 2011-11-07 DIAGNOSIS — J984 Other disorders of lung: Secondary | ICD-10-CM

## 2011-11-07 DIAGNOSIS — D381 Neoplasm of uncertain behavior of trachea, bronchus and lung: Secondary | ICD-10-CM

## 2011-11-07 DIAGNOSIS — R911 Solitary pulmonary nodule: Secondary | ICD-10-CM

## 2011-11-07 NOTE — Progress Notes (Signed)
HPI patient returns for followup. She has a left upper lobe and right lower lobe groundglass nodules and several other small scattered nodules. CT scan today showed no change in these groundglass nodules. We have been following this for approximately a year. We plan to repeat another CT scan in 6 months. She will probably need to be followed for 2-3 more years. Dr. Tyrone Sage will see her in 6 months as I will be retired. She has had no other pulmonary symptoms. She has quit smoking.   Current Outpatient Prescriptions  Medication Sig Dispense Refill  . Blood Glucose Monitoring Suppl (ONE TOUCH ULTRA SYSTEM KIT) W/DEVICE KIT 1 kit by Does not apply route once.  1 each  0  . cetirizine (ZYRTEC) 10 MG tablet Take 10 mg by mouth daily.        Marland Kitchen esomeprazole (NEXIUM) 40 MG capsule Take 1 capsule (40 mg total) by mouth 2 (two) times daily.  90 capsule  3  . fluticasone (FLONASE) 50 MCG/ACT nasal spray Place 2 sprays into the nose daily.  16 g  3  . glimepiride (AMARYL) 4 MG tablet Take 4 mg by mouth 2 (two) times daily before a meal.      . glucose blood (ONE TOUCH TEST STRIPS) test strip Use to check blood sugar 1-2 times daily  100 each  12  . insulin glargine (LANTUS) 100 UNIT/ML injection Inject 15 Units into the skin at bedtime.      . Insulin Pen Needle (ULTICARE MINI PEN NEEDLES) 31G X 6 MM MISC Use to check blood sugar 1-2 times daily  100 each  12  . lisinopril (PRINIVIL,ZESTRIL) 5 MG tablet Take 1 tablet (5 mg total) by mouth daily.  90 tablet  3  . Multiple Vitamin (MULITIVITAMIN WITH MINERALS) TABS Take 1 tablet by mouth daily. 50+      . sitaGLIPtin (JANUVIA) 100 MG tablet Take 1 tablet (100 mg total) by mouth daily.  90 tablet  3     Review of Systems: Unchanged   Physical Exam lungs are clear auscultation percussion   Diagnostic Tests: CT scan shows that the groundglass nodules were unchanged   Impression bilateral groundglass nodules:   Plan: Return in 6 months to Dr. Tyrone Sage  with low-dose CT scan

## 2011-12-24 ENCOUNTER — Other Ambulatory Visit: Payer: Self-pay | Admitting: Otolaryngology

## 2011-12-24 DIAGNOSIS — E041 Nontoxic single thyroid nodule: Secondary | ICD-10-CM

## 2011-12-24 DIAGNOSIS — E042 Nontoxic multinodular goiter: Secondary | ICD-10-CM

## 2011-12-25 ENCOUNTER — Ambulatory Visit
Admission: RE | Admit: 2011-12-25 | Discharge: 2011-12-25 | Disposition: A | Discharge status: Home / Self Care | Payer: BC Managed Care – PPO | Source: Ambulatory Visit | Attending: Otolaryngology | Admitting: Otolaryngology

## 2011-12-25 DIAGNOSIS — E041 Nontoxic single thyroid nodule: Secondary | ICD-10-CM

## 2011-12-25 DIAGNOSIS — E042 Nontoxic multinodular goiter: Secondary | ICD-10-CM

## 2011-12-28 ENCOUNTER — Other Ambulatory Visit: Payer: Self-pay | Admitting: Family Medicine

## 2012-01-24 ENCOUNTER — Other Ambulatory Visit: Payer: Self-pay | Admitting: Family Medicine

## 2012-01-28 ENCOUNTER — Other Ambulatory Visit: Payer: Self-pay

## 2012-01-28 NOTE — Telephone Encounter (Signed)
Pt request status of Nexium refill. [facility] 01[facility]. Pt will ck with [facility].

## 2012-02-12 IMAGING — CR DG WRIST COMPLETE 3+V*R*
2 series · 2 of 2 positions shown · non-contrast
Comparison: None.

CLINICAL DATA: Pain

RIGHT WRIST - COMPLETE 3+ VIEW

[view not recorded (1 of 2)]
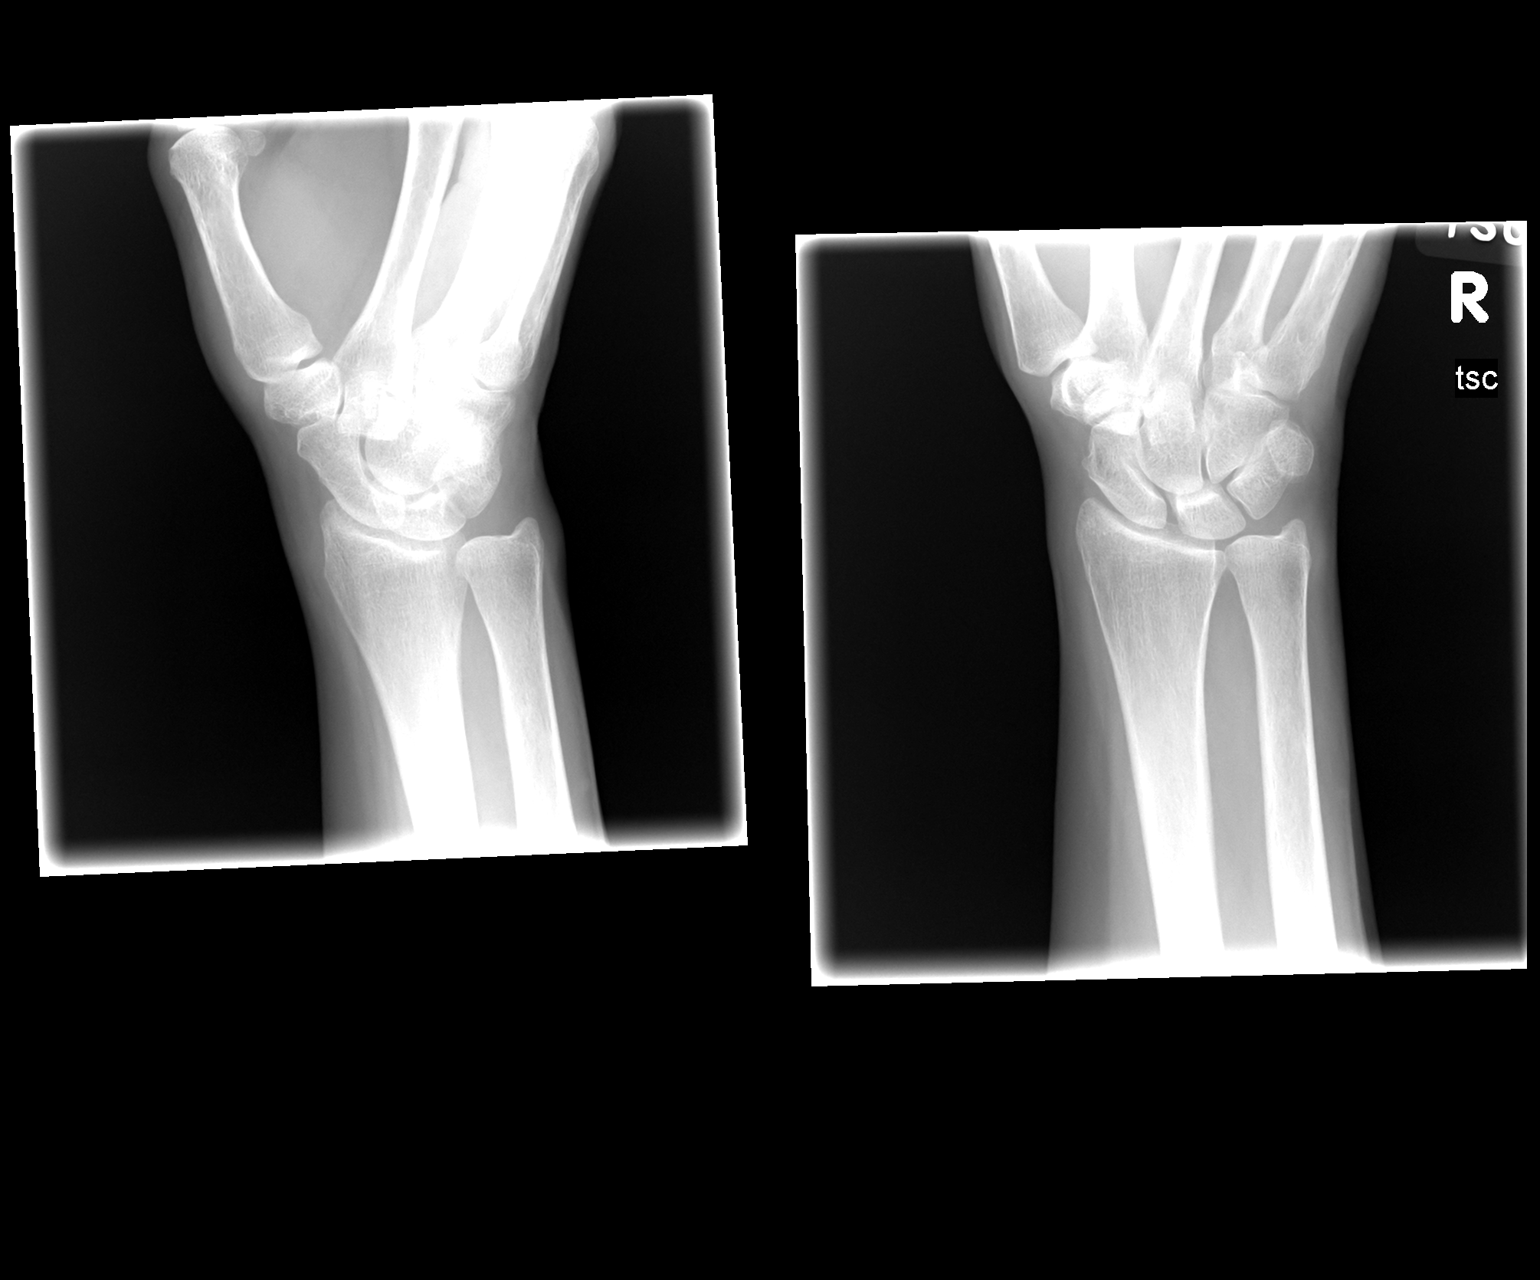

[view not recorded (2 of 2)]
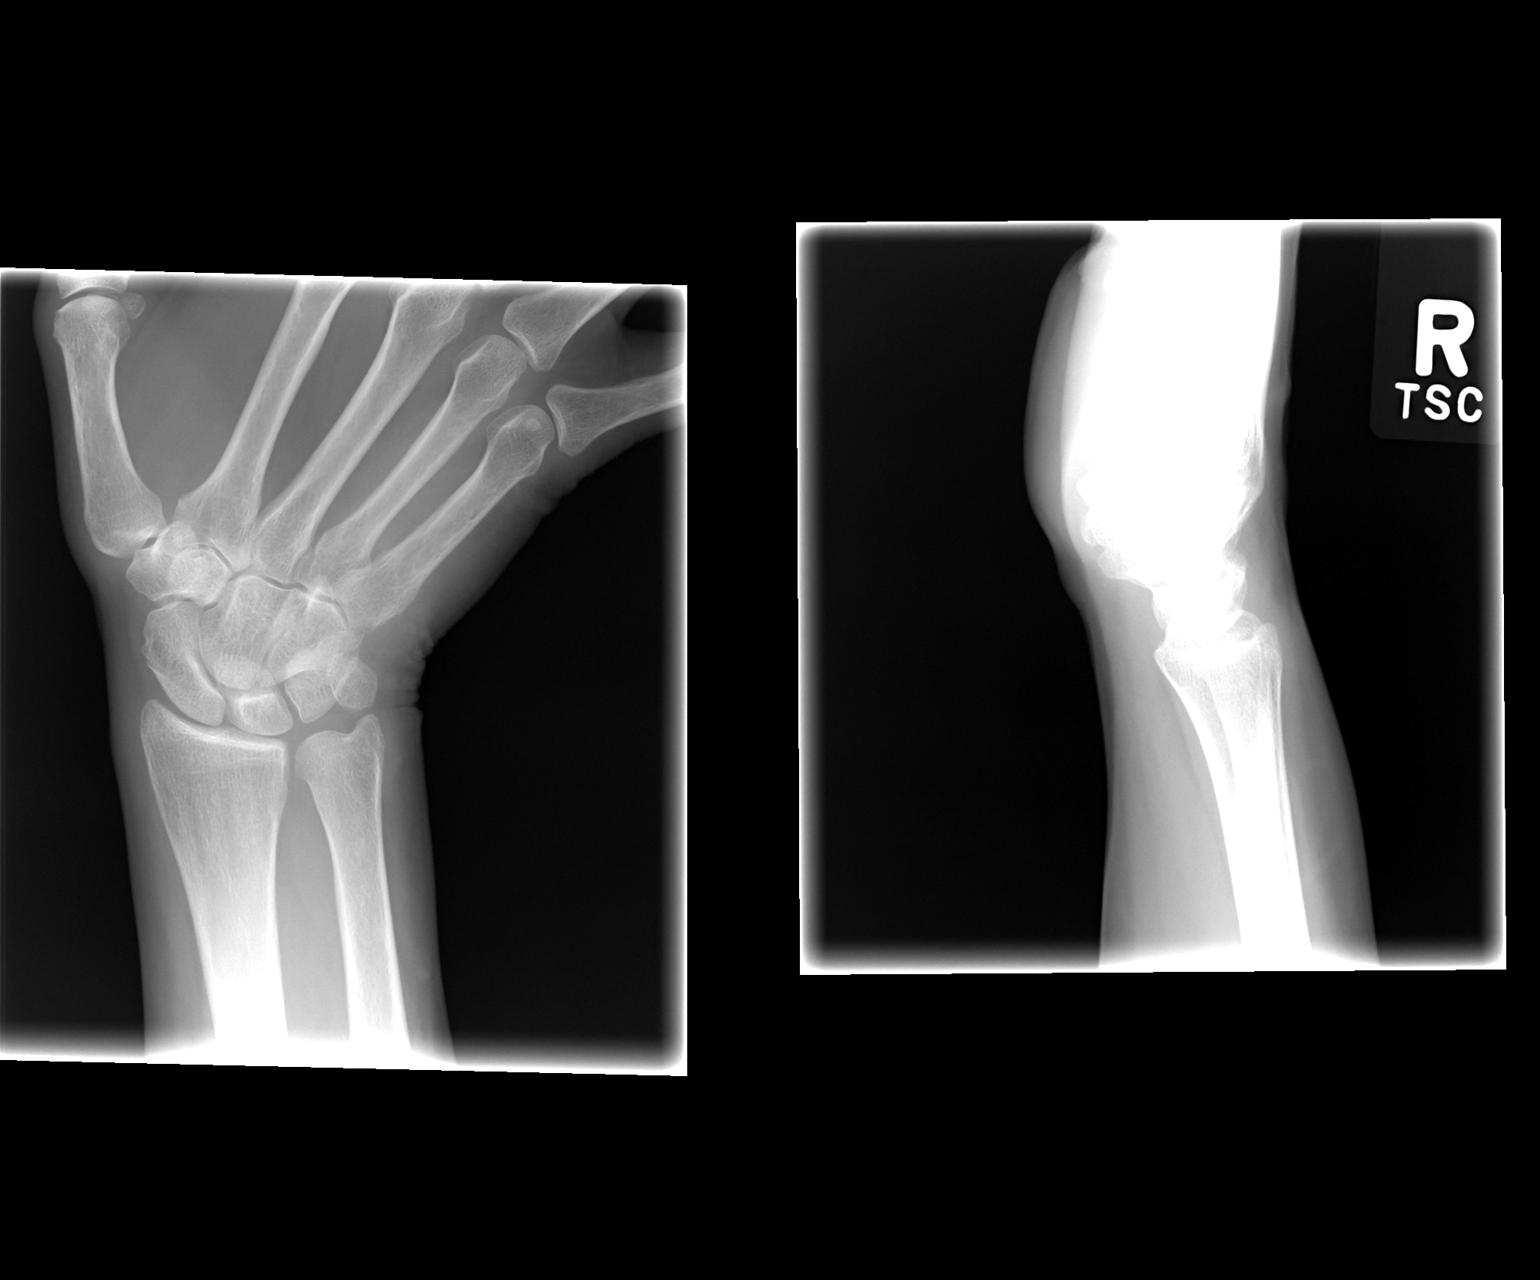

[2 of 2 positions shown; findings below may reference images not displayed]

FINDINGS: There is no evidence of bone, joint, or soft tissue
abnormality.
IMPRESSION: Negative right wrist.

## 2012-03-25 ENCOUNTER — Other Ambulatory Visit: Payer: Self-pay | Admitting: Cardiothoracic Surgery

## 2012-03-25 DIAGNOSIS — D381 Neoplasm of uncertain behavior of trachea, bronchus and lung: Secondary | ICD-10-CM

## 2012-04-16 ENCOUNTER — Other Ambulatory Visit: Payer: Self-pay | Admitting: Family Medicine

## 2012-04-17 ENCOUNTER — Ambulatory Visit: Payer: BC Managed Care – PPO | Admitting: Cardiothoracic Surgery

## 2012-04-17 ENCOUNTER — Other Ambulatory Visit: Payer: BC Managed Care – PPO

## 2012-06-20 ENCOUNTER — Other Ambulatory Visit: Payer: Self-pay | Admitting: Family Medicine

## 2012-07-02 ENCOUNTER — Ambulatory Visit (INDEPENDENT_AMBULATORY_CARE_PROVIDER_SITE_OTHER): Payer: BC Managed Care – PPO | Admitting: *Deleted

## 2012-07-02 DIAGNOSIS — Z23 Encounter for immunization: Secondary | ICD-10-CM

## 2012-08-03 IMAGING — US US SOFT TISSUE HEAD/NECK
1 series · 14 of 25 positions shown · non-contrast
Comparison: Ultrasound of the thyroid of 12/18/2010

CLINICAL DATA: Nontoxic multinodular goiter, follow-up, biopsy of
the dominant left lobe nodule is in December 2010

THYROID ULTRASOUND
TECHNIQUE: Ultrasound examination of the thyroid gland and adjacent
soft tissues was performed.

[Series 1: us soft tissue head/neck · 0.06mm/px · 14 of 72 slices shown]
[im 1/72]
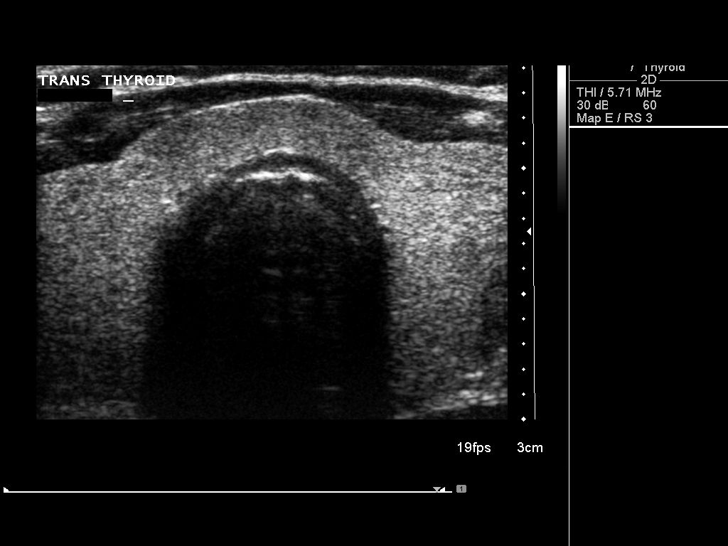
[im 6/72]
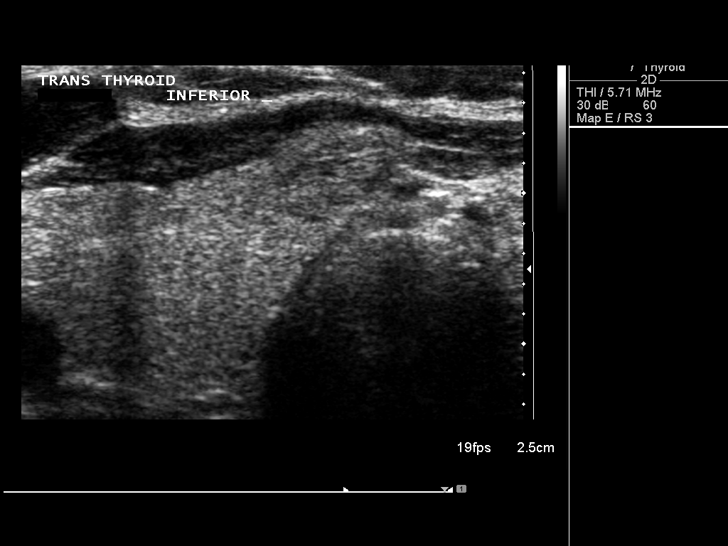
[im 12/72]
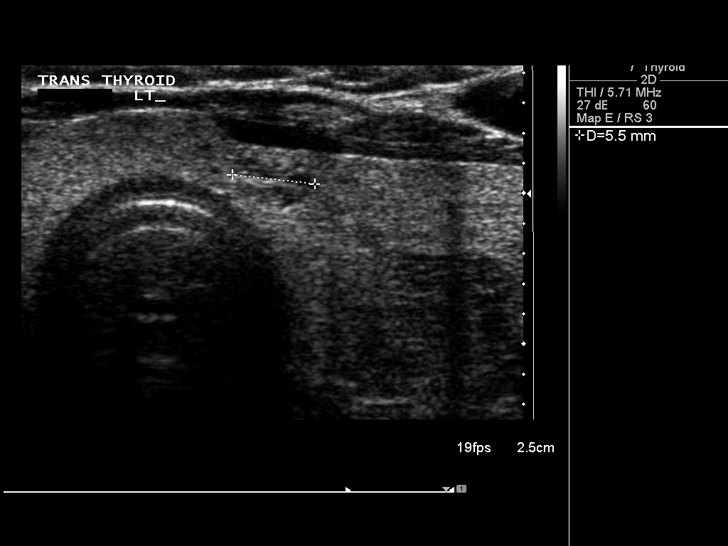
[im 18/72]
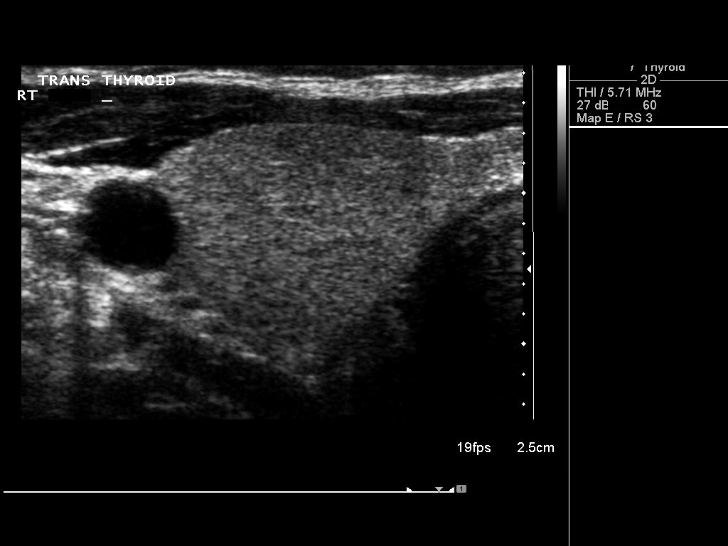
[im 24/72]
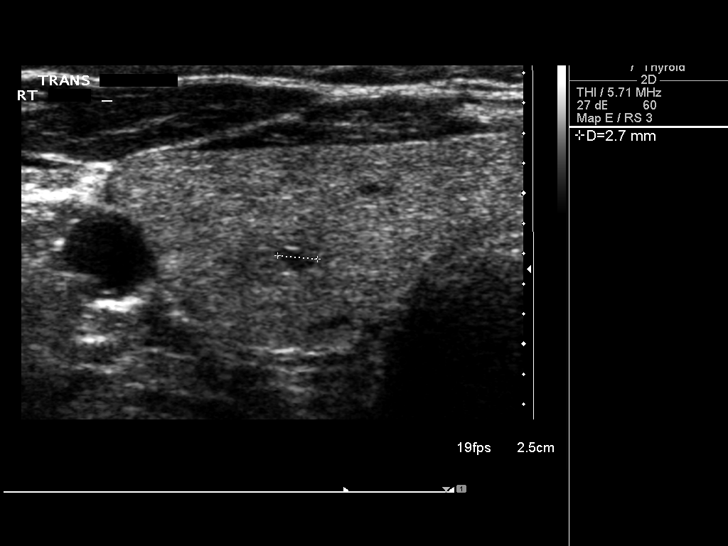
[im 27/72]
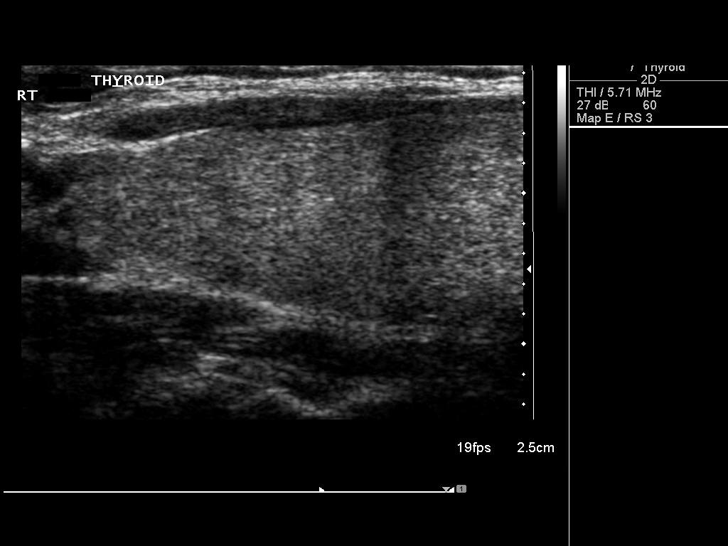
[im 33/72]
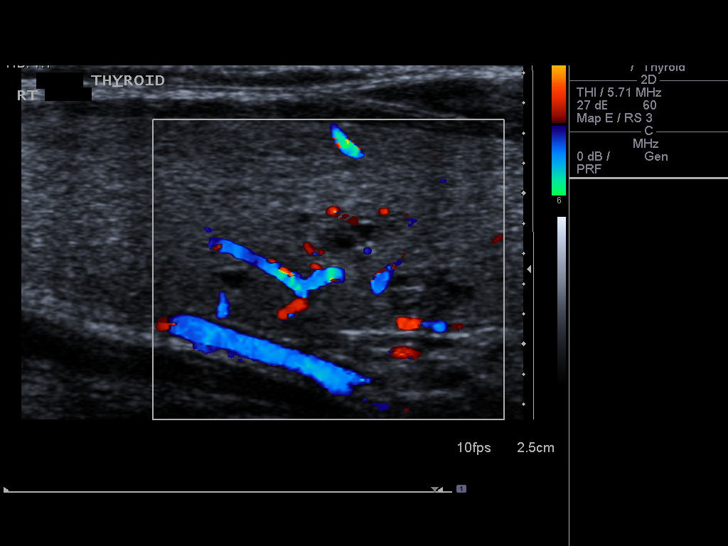
[im 39/72]
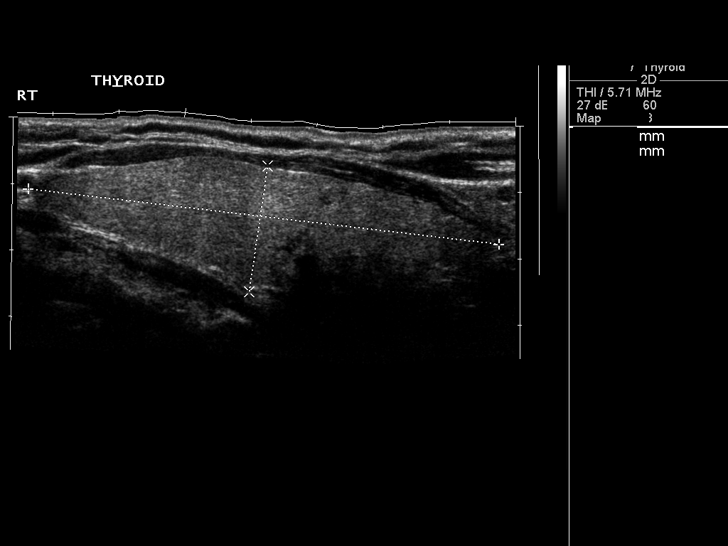
[im 45/72]
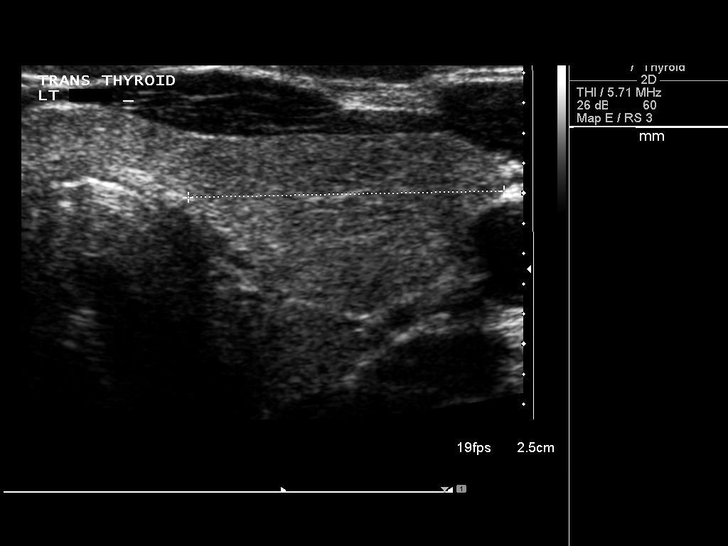
[im 48/72]
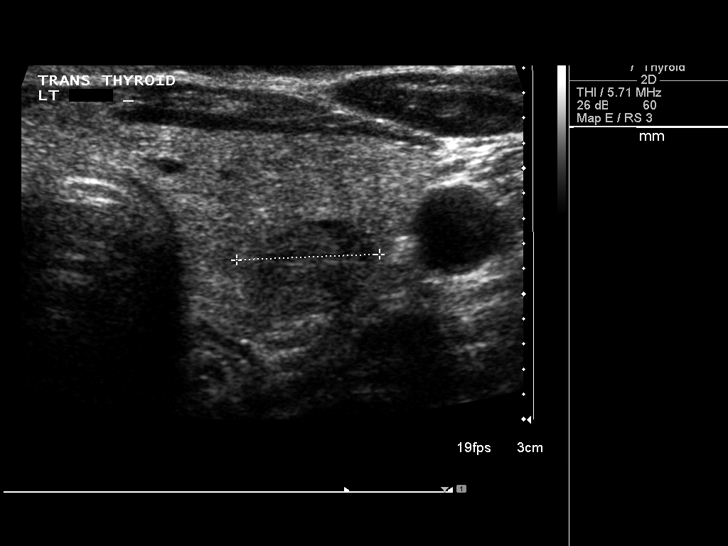
[im 54/72]
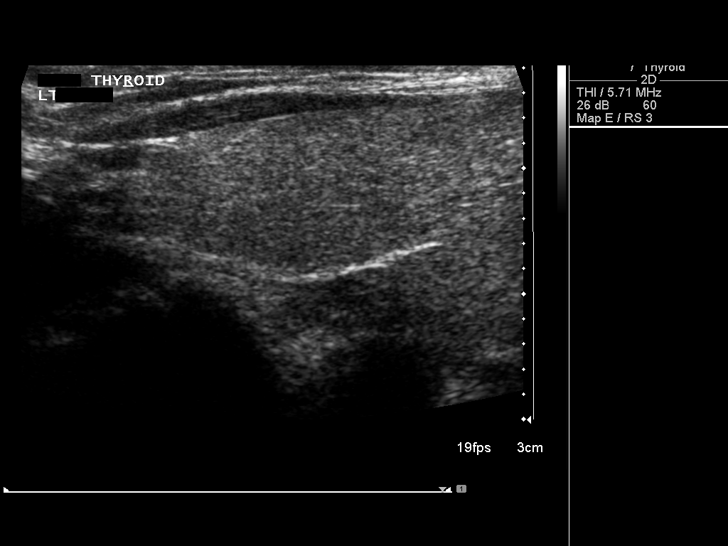
[im 60/72]
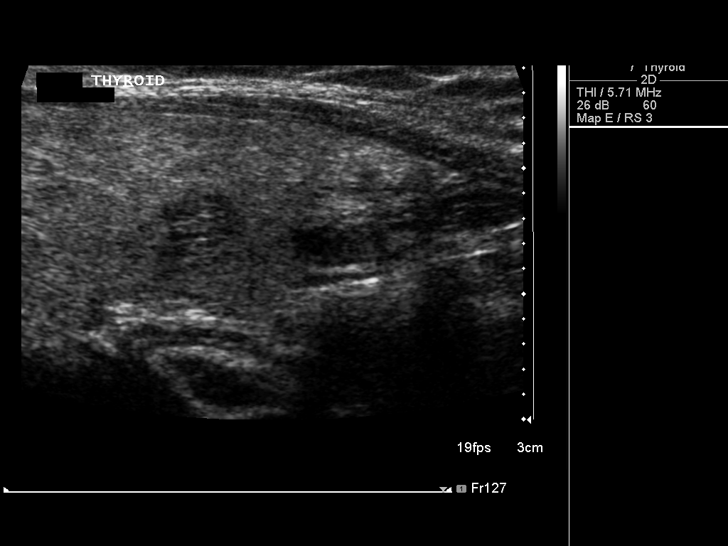
[im 66/72]
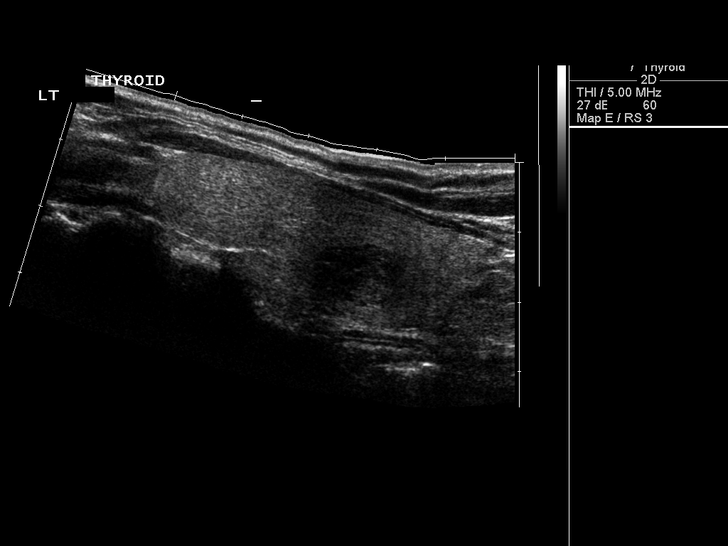
[im 72/72]
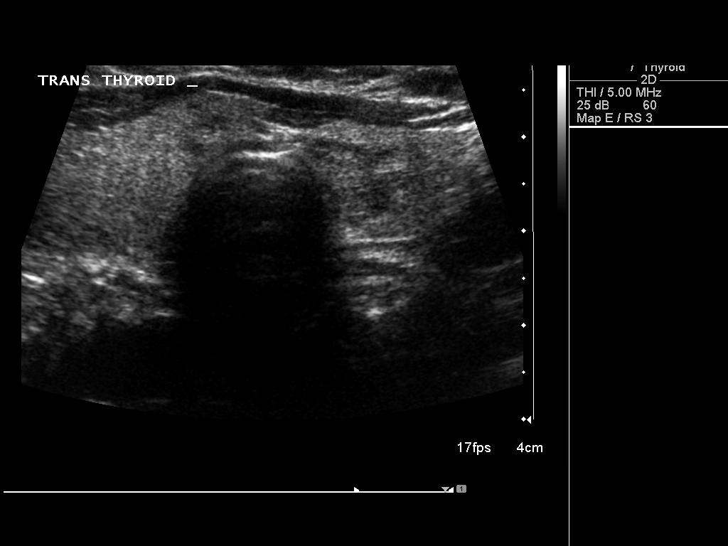

[14 of 25 positions shown; findings below may reference images not displayed]

FINDINGS: Right thyroid lobe:  7.1 x 1.9 x 2.2 cm.  (Previously 5.7 x 1.6 x
2.3 cm).
Left thyroid lobe:  6.1 x 2.2 x 2.1 cm.  (Previously 5.7 x 2.1 x
2.3 cm).
Isthmus:  7 mm in thickness.

Focal nodules:  The echogenicity of the thyroid gland is relatively
homogeneous.  There are nodules bilaterally.  The largest nodule is
in the lower pole of the left lobe measuring 1.5 x 0.9 x 1.1 cm
compared to 1.7 x 0.9 x 1.2 cm.  A solid nodule in the more
inferior left lobe measures 1.3 x 0.8 x 1.4 cm compared to 1.6 x
1.0 x 1.4 cm previously.  The remainder of nodules measure 8 mm or
less in size many of which are hypoechoic.

Lymphadenopathy:  None visualized.
IMPRESSION: Stable multiple thyroid nodules in this enlarged thyroid gland.

## 2012-08-06 ENCOUNTER — Emergency Department: Payer: Self-pay | Admitting: Emergency Medicine

## 2012-08-06 LAB — URINALYSIS, COMPLETE
Bacteria: NONE SEEN
Ketone: NEGATIVE
Ph: 6 (ref 4.5–8.0)
Protein: NEGATIVE
RBC,UR: 4 /HPF (ref 0–5)
Squamous Epithelial: <1

## 2012-08-06 LAB — CBC
MCH: 28.4 pg (ref 26.0–34.0)
MCHC: 33.1 g/dL (ref 32.0–36.0)
MCV: 86 fL (ref 80–100)

## 2012-08-06 LAB — COMPREHENSIVE METABOLIC PANEL
Albumin: 3.8 g/dL (ref 3.4–5.0)
Alkaline Phosphatase: 142 U/L — ABNORMAL HIGH (ref 50–136)
Anion Gap: 7 (ref 7–16)
Bilirubin,Total: 0.2 mg/dL (ref 0.2–1.0)
Chloride: 102 mmol/L (ref 98–107)
Creatinine: 1.36 mg/dL — ABNORMAL HIGH (ref 0.60–1.30)
Glucose: 347 mg/dL — ABNORMAL HIGH (ref 65–99)
Osmolality: 287 (ref 275–301)
Potassium: 3.7 mmol/L (ref 3.5–5.1)
SGOT(AST): 19 U/L (ref 15–37)
SGPT (ALT): 29 U/L (ref 12–78)
Total Protein: 7.2 g/dL (ref 6.4–8.2)

## 2012-08-07 ENCOUNTER — Telehealth: Payer: Self-pay | Admitting: Family Medicine

## 2012-08-07 NOTE — Telephone Encounter (Signed)
Please call to check on pt. 

## 2012-08-07 NOTE — Telephone Encounter (Signed)
Spoke with patient, she says she has been under a lot of stress, which has caused her blood sugar to go up.  She did go to ER and doctor there recommended she use a short acting insulin, in addition to the lantus.  She went to [facility] and I will request notes.  Do you want her to come in to discuss?  Uses [facility] [address].

## 2012-08-07 NOTE — Telephone Encounter (Signed)
Pt left v/m requesting call back at [phone] re;short term insulin.

## 2012-08-07 NOTE — Telephone Encounter (Signed)
[facility] Triage Call Report Triage Record Num: [id number] Operator: Alphonsa Overall Patient Name: Audrey Green Call Date & Time: 08/06/2012 6:21:38PM Patient Phone: [phone] PCP: Ruthe Mannan Patient Gender: Female PCP Fax : [phone] Patient DOB: 1945/08/17 Practice Name: [facility] Reason for Call: [name] calling about diabetes control. Onset 08/03/12. Began with increase in urination, not sleeping well, change in schedule/pt Mother sick, diet changes. Glucose higher 401 at 1800. Slight headache, occasional pain in upper abdomen, frequent urination. Due for insulin at HS. Lantus 10units taken at 1430. Pt without short acting insulin. Januvia takes in am. 295 glucose at 0900. 444glucose at 1430. Signs of ketoacidosis and glucose >300 at 1837. Care advice given per Diabetes Control Protocol. Pt to seek ED evaluation now. [facility], pt will have sister take her. Protocol(s) Used: Diabetes: Control Problems Recommended Outcome per Protocol: See ED Immediately Reason for Outcome: Signs and symptoms of ketoacidosis AND blood sugar more than 300 mg/dl Care Advice: ~ Another adult should drive. ~ If available, bring recent log of blood sugars or bring blood glucose monitor with log of blood sugars. Dehydration can affect blood sugar levels. Drink water during transport and while waiting to see a provider. If vomiting, take sips of water or suck on ice chips. ~ ~ When sitting, use a chair with arms to help protect from falling. ~ IMMEDIATE ACTION Write down provider's name. List or place the following in a bag for transport with the patient: current prescription and/or nonprescription medications; alternative treatments, therapies and medications; and street drugs. ~ ~ Call EMS 911 if any loss of consciousness, difficult to awaken, slow to respond, or new onset of confused thinking. 08/06/2012 6:39:53PM Page 1 of 1 [id number]

## 2012-08-07 NOTE — Telephone Encounter (Signed)
Yes I cannot prescribe a new insulin without seeing her.  Please make a 30 min appt.

## 2012-08-08 NOTE — Telephone Encounter (Signed)
Advised patient, appt scheduled.

## 2012-08-11 ENCOUNTER — Ambulatory Visit: Payer: BC Managed Care – PPO | Admitting: Family Medicine

## 2012-08-12 ENCOUNTER — Ambulatory Visit: Payer: BC Managed Care – PPO | Admitting: Family Medicine

## 2012-08-14 ENCOUNTER — Encounter: Payer: Self-pay | Admitting: Family Medicine

## 2012-08-14 ENCOUNTER — Other Ambulatory Visit: Payer: Self-pay | Admitting: *Deleted

## 2012-08-14 ENCOUNTER — Other Ambulatory Visit: Payer: Self-pay | Admitting: Family Medicine

## 2012-08-14 ENCOUNTER — Ambulatory Visit (INDEPENDENT_AMBULATORY_CARE_PROVIDER_SITE_OTHER): Payer: BC Managed Care – PPO | Admitting: Family Medicine

## 2012-08-14 VITALS — BP 130/70 | HR 88 | Temp 97.7°F | Wt 123.0 lb

## 2012-08-14 DIAGNOSIS — Z136 Encounter for screening for cardiovascular disorders: Secondary | ICD-10-CM

## 2012-08-14 DIAGNOSIS — E119 Type 2 diabetes mellitus without complications: Secondary | ICD-10-CM

## 2012-08-14 LAB — COMPREHENSIVE METABOLIC PANEL
Albumin: 4 g/dL (ref 3.5–5.2)
BUN: 16 mg/dL (ref 6–23)
CO2: 24 mEq/L (ref 19–32)
Calcium: 9.1 mg/dL (ref 8.4–10.5)
Chloride: 100 mEq/L (ref 96–112)
Glucose, Bld: 336 mg/dL — ABNORMAL HIGH (ref 70–99)
Potassium: 4.3 mEq/L (ref 3.5–5.1)
Total Protein: 7.3 g/dL (ref 6.0–8.3)

## 2012-08-14 LAB — LIPID PANEL
Cholesterol: 206 mg/dL — ABNORMAL HIGH (ref 0–200)
Triglycerides: 229 mg/dL — ABNORMAL HIGH (ref 0.0–149.0)

## 2012-08-14 MED ORDER — ESOMEPRAZOLE MAGNESIUM 40 MG PO CPDR: DELAYED_RELEASE_CAPSULE | ORAL | Status: DC

## 2012-08-14 MED ORDER — GLUCOSE BLOOD VI STRP: ORAL_STRIP | Status: DC

## 2012-08-14 MED ORDER — BAYER CONTOUR MONITOR DEVI: Status: DC

## 2012-08-14 MED ORDER — INSULIN PEN NEEDLE 31G X 6 MM MISC: Status: AC

## 2012-08-14 MED ORDER — BAYER MICROLET LANCETS MISC: Status: DC

## 2012-08-14 MED ORDER — FLUTICASONE PROPIONATE 50 MCG/ACT NA SUSP: NASAL | Status: DC

## 2012-08-14 NOTE — Patient Instructions (Addendum)
Good to see you, Audrey Green. Let's increase your lantus to 15 units twice daily. Call me next week with your blood sugar readings.  Continue your other medications as prescribed.

## 2012-08-14 NOTE — Progress Notes (Signed)
Very pleasant 67 yo female who has not been seen for routine care in over a year here to discuss DM.   DM- unfortunately has been under significant stress caring for her mother recently. Recently put her in a SNF which was a good move but she does constantly worry about her.    Lab Results  Component Value Date   HGBA1C 8.8* 05/14/2011   Currently on Lantus 10 units, Januvia 100 mg daily, amaryl 4 mg..  Metformin causes severe GI upset.  Byetta caused severe episodes of hypoglycemia.     CBG spike to 444 on 2/27.  She went to [facility].  We are still awaiting those records.   CBGS have since been in 200s.   Patient Active Problem List  Diagnosis  . Type II or unspecified type diabetes mellitus without mention of complication, not stated as uncontrolled  . HYPERLIPIDEMIA  . ESSENTIAL HYPERTENSION, BENIGN  . HYPERTENSION  . GERD  . HYPERLIPIDEMIA, MILD, HX OF  . Goiter, nodular  . History of transfusion of whole blood  . Positive hepatitis C antibody test  . HCV antibody positive   Past Medical History  Diagnosis Date  . Allergy   . Diabetes mellitus   . GERD (gastroesophageal reflux disease)   . Hyperlipidemia   . Hypertension   . Pancreatitis   . Osteoporosis   . History of colon polyps   . Hepatitis    Past Surgical History  Procedure Laterality Date  . Abdominal hysterectomy    . Cholecystectomy    . Tonsillectomy    . Hand surgery     History  Substance Use Topics  . Smoking status: Former Smoker    Quit date: 06/11/2008  . Smokeless tobacco: Never Used  . Alcohol Use: No   Family History  Problem Relation Age of Onset  . Breast cancer Mother   . Colon cancer Neg Hx   . Diabetes Mother   . Heart disease Mother    Allergies  Allergen Reactions  . Codeine     REACTION: Hives, tongue swells  . Penicillins     REACTION: rash and itching  . Sulfa Drugs Cross Reactors     itiching   Current Outpatient Prescriptions on File Prior to Visit  Medication  Sig Dispense Refill  . Blood Glucose Monitoring Suppl (ONE TOUCH ULTRA SYSTEM KIT) W/DEVICE KIT 1 kit by Does not apply route once.  1 each  0  . cetirizine (ZYRTEC) 10 MG tablet Take 10 mg by mouth daily.        . fluticasone (FLONASE) 50 MCG/ACT nasal spray USE 2 SPRAYS IN EACH NOSTRIL DAILY  48 g  0  . glimepiride (AMARYL) 4 MG tablet Take 4 mg by mouth 2 (two) times daily before a meal.      . glucose blood (ONE TOUCH TEST STRIPS) test strip Use to check blood sugar 1-2 times daily  100 each  12  . insulin glargine (LANTUS) 100 UNIT/ML injection Inject 15 Units into the skin at bedtime.      . Insulin Pen Needle (ULTICARE MINI PEN NEEDLES) 31G X 6 MM MISC Use to check blood sugar 1-2 times daily  100 each  12  . lisinopril (PRINIVIL,ZESTRIL) 5 MG tablet TAKE 1 TABLET (5MG  TOTAL ) BY MOUTH DAILY  90 tablet  0  . Multiple Vitamin (MULITIVITAMIN WITH MINERALS) TABS Take 1 tablet by mouth daily. 50+      . NEXIUM 40 MG capsule TAKE 1  CAPSULE (40MG  TOTAL ) BY MOUTH 2 TIMES DAILY  180 capsule  1  . sitaGLIPtin (JANUVIA) 100 MG tablet Take 1 tablet (100 mg total) by mouth daily.  90 tablet  3   No current facility-administered medications on file prior to visit.   The PMH, PSH, Social History, Family History, Medications, and allergies have been reviewed in [facility], and have been updated if relevant.   Review of Systems       See HPI CV:  Denies chest pain or discomfort. Resp:  Denies shortness of breath.  Physical Exam BP 130/70  Pulse 88  Temp(Src) 97.7 F (36.5 C)  Wt 123 lb (55.792 kg)  BMI 21.79 kg/m2  General:  Well-developed,well-nourished,in no acute distress; alert,appropriate and cooperative throughout examination Mouth:  MMM Resp:  CTA bilaterally CVS:  RRR Psych:  Oriented X3 and normally interactive.    Diabetes Management Exam:     Foot Exam (with socks and/or shoes not present):       Sensory-Pinprick/Light touch:          Left medial foot (L-4): normal          Left  dorsal foot (L-5): normal          Left lateral foot (S-1): normal          Right medial foot (L-4): normal       Sensory-Monofilament:          Left foot: normal          Right foot: normal       Inspection:          Left foot: normal          Right foot: normal       Nails:          Left foot: thickened          Right foot: thickened  Assessment and Plan: 1. Type II or unspecified type diabetes mellitus without mention of complication, not stated as uncontrolled Check a1c today, likely will be quite high.  Increase Lantus to 15 units twice daily.  She will call me with her CBGs next week.  Will request records from [facility]. - Hemoglobin A1c - Comprehensive metabolic panel  2. Screening for ischemic heart disease - Lipid Panel

## 2012-08-18 ENCOUNTER — Other Ambulatory Visit: Payer: Self-pay | Admitting: Family Medicine

## 2012-08-18 DIAGNOSIS — N189 Chronic kidney disease, unspecified: Secondary | ICD-10-CM

## 2012-08-22 ENCOUNTER — Other Ambulatory Visit: Payer: Self-pay | Admitting: *Deleted

## 2012-08-22 MED ORDER — INSULIN GLARGINE 100 UNIT/ML ~~LOC~~ SOLN: SUBCUTANEOUS | Status: DC

## 2012-08-22 MED ORDER — RANITIDINE HCL 300 MG PO CAPS: 300.0000 mg | ORAL_CAPSULE | Freq: Two times a day (BID) | ORAL | Status: DC

## 2012-08-22 MED ORDER — VARENICLINE TARTRATE 1 MG PO TABS: 1.0000 mg | ORAL_TABLET | Freq: Two times a day (BID) | ORAL | Status: DC

## 2012-08-22 MED ORDER — GLIMEPIRIDE 4 MG PO TABS: 4.0000 mg | ORAL_TABLET | Freq: Two times a day (BID) | ORAL | Status: DC

## 2012-08-22 MED ORDER — SITAGLIPTIN PHOSPHATE 100 MG PO TABS: 100.0000 mg | ORAL_TABLET | Freq: Every day | ORAL | Status: DC

## 2012-09-01 ENCOUNTER — Encounter: Payer: Self-pay | Admitting: Family Medicine

## 2012-11-24 ENCOUNTER — Ambulatory Visit: Payer: Self-pay | Admitting: Gastroenterology

## 2012-11-24 LAB — CBC WITH DIFFERENTIAL/PLATELET
Basophil %: 0.8 %
Eosinophil #: 0.4 10*3/uL (ref 0.0–0.7)
Eosinophil %: 5.5 %
HCT: 38.5 % (ref 35.0–47.0)
HGB: 12.7 g/dL (ref 12.0–16.0)
Lymphocyte #: 3.9 10*3/uL — ABNORMAL HIGH (ref 1.0–3.6)
MCH: 28.5 pg (ref 26.0–34.0)
MCHC: 33 g/dL (ref 32.0–36.0)
Monocyte #: 0.7 x10 3/mm (ref 0.2–0.9)
Platelet: 318 10*3/uL (ref 150–440)
RBC: 4.47 10*6/uL (ref 3.80–5.20)

## 2012-12-23 ENCOUNTER — Other Ambulatory Visit: Payer: Self-pay | Admitting: *Deleted

## 2012-12-23 DIAGNOSIS — R911 Solitary pulmonary nodule: Secondary | ICD-10-CM

## 2012-12-29 ENCOUNTER — Other Ambulatory Visit: Payer: Self-pay | Admitting: Otolaryngology

## 2012-12-29 DIAGNOSIS — E041 Nontoxic single thyroid nodule: Secondary | ICD-10-CM

## 2012-12-31 ENCOUNTER — Ambulatory Visit
Admission: RE | Admit: 2012-12-31 | Discharge: 2012-12-31 | Disposition: A | Discharge status: Home / Self Care | Payer: Medicare Other | Source: Ambulatory Visit | Attending: Otolaryngology | Admitting: Otolaryngology

## 2012-12-31 DIAGNOSIS — E041 Nontoxic single thyroid nodule: Secondary | ICD-10-CM

## 2013-01-15 ENCOUNTER — Ambulatory Visit (INDEPENDENT_AMBULATORY_CARE_PROVIDER_SITE_OTHER): Payer: Medicare Other | Admitting: Cardiothoracic Surgery

## 2013-01-15 ENCOUNTER — Encounter: Payer: Self-pay | Admitting: Cardiothoracic Surgery

## 2013-01-15 ENCOUNTER — Ambulatory Visit
Admission: RE | Admit: 2013-01-15 | Discharge: 2013-01-15 | Disposition: A | Discharge status: Home / Self Care | Payer: Medicare Other | Source: Ambulatory Visit | Attending: Cardiothoracic Surgery | Admitting: Cardiothoracic Surgery

## 2013-01-15 VITALS — BP 158/78 | HR 70 | Resp 20 | Ht 63.0 in | Wt 144.0 lb

## 2013-01-15 DIAGNOSIS — Q278 Other specified congenital malformations of peripheral vascular system: Secondary | ICD-10-CM

## 2013-01-15 DIAGNOSIS — R918 Other nonspecific abnormal finding of lung field: Secondary | ICD-10-CM

## 2013-01-15 DIAGNOSIS — R911 Solitary pulmonary nodule: Secondary | ICD-10-CM

## 2013-01-15 NOTE — Progress Notes (Signed)
[address]       [place] [address]             [phone]                    Appollonia Klee [facility] Medical Record #[id number] Date of Birth: 05-07-1946  Referring: Dianne Dun, MD Primary Care: Elmo Putt, MD  Chief Complaint:    Chief Complaint  Patient presents with  . Follow-up    6 month f/u with low-dose Chest CT, bilateral lung nodules    History of Present Illness:      patient returns for followup. She has a left upper lobe and right lower lobe groundglass nodules and several other small scattered nodules in the past and followed bt Dr Edwyna Shell. CT scan today showed no change in these groundglass nodules.  Dr Edwyna Shell had recommended  plan to repeat another CT scan in 6 months.  She has had no other pulmonary symptoms. She has quit smoking 10 months ago. She missed her last followup appointment because she was caring for her 57 year old mother who recently passed away.   She has no symptoms of coronary artery disease no angina or chest discomfort.    Current Activity/ Functional Status:  Patient is independent with mobility/ambulation, transfers, ADL's, IADL's.  Zubrod Score: At the time of surgery this patient's most appropriate activity status/level should be described as: [x]  Normal activity, no symptoms []  Symptoms, fully ambulatory []  Symptoms, in bed less than or equal to 50% of the time []  Symptoms, in bed greater than 50% of the time but less than 100% []  Bedridden []  Moribund   Past Medical History  Diagnosis Date  . Allergy   . Diabetes mellitus   . GERD (gastroesophageal reflux disease)   . Hyperlipidemia   . Hypertension   . Pancreatitis   . Osteoporosis   . History of colon polyps   . Hepatitis     Past Surgical History  Procedure Laterality Date  . Abdominal hysterectomy    . Cholecystectomy    . Tonsillectomy    . Hand surgery      Family History  Problem Relation Age of Onset  . Breast cancer Mother     . Colon cancer Neg Hx   . Diabetes Mother   . Heart disease Mother     History   Social History  . Marital Status: Single    Spouse Name: N/A    Number of Children: 0  . Years of Education: N/A   Occupational History  . Retired- Buyer, retail    Social History Main Topics  . Smoking status: Former Smoker    Quit date: 06/11/2008  . Smokeless tobacco: Never Used  . Alcohol Use: No  . Drug Use: No  . Sexually Active: Not on file   Other Topics Concern  . Not on file   Social History Narrative    moved here from [place] to take care of mother with Alzheimers, who passed away last week        1 caffeine drink daily     History  Smoking status  . Former Smoker  . Quit date: 06/11/2008  Smokeless tobacco  . Never Used    History  Alcohol Use No     Allergies  Allergen Reactions  . Codeine     REACTION: Hives, tongue swells  . Penicillins     REACTION: rash and  itching  . Sulfa Drugs Cross Reactors     itiching    Current Outpatient Prescriptions  Medication Sig Dispense Refill  . alendronate (FOSAMAX) 70 MG tablet Take 70 mg by mouth every 7 (seven) days.       . cetirizine (ZYRTEC) 10 MG tablet Take 10 mg by mouth daily.        Marland Kitchen esomeprazole (NEXIUM) 40 MG capsule TAKE 1 CAPSULE (40MG  TOTAL ) BY MOUTH 2 TIMES DAILY  180 capsule  1  . fluticasone (FLONASE) 50 MCG/ACT nasal spray USE 2 SPRAYS IN EACH NOSTRIL DAILY  48 g  1  . glimepiride (AMARYL) 4 MG tablet Take 1 tablet (4 mg total) by mouth 2 (two) times daily before a meal.  180 tablet  1  . Glucose Blood (ONETOUCH ULTRA BLUE VI) Use to check blood sugar two times a day      . insulin glargine (LANTUS SOLOSTAR) 100 UNIT/ML injection Inject 15 units twice a day.  15 pen  1  . Insulin Pen Needle (ULTICARE MINI PEN NEEDLES) 31G X 6 MM MISC Use to inject insulin daily  200 each  1  . lisinopril (PRINIVIL,ZESTRIL) 5 MG tablet Take one tablet by mouth daily  90 tablet  0  . Multiple  Vitamin (MULITIVITAMIN WITH MINERALS) TABS Take 1 tablet by mouth daily. 50+      . ONE TOUCH LANCETS MISC Use to check blood sugar twice a day      . ranitidine (ZANTAC) 300 MG capsule Take 1 capsule (300 mg total) by mouth 2 (two) times daily.  180 capsule  1  . sitaGLIPtin (JANUVIA) 100 MG tablet Take 1 tablet (100 mg total) by mouth daily.  90 tablet  1  . Vitamin D, Ergocalciferol, (DRISDOL) 50000 UNITS CAPS capsule        No current facility-administered medications for this visit.       Review of Systems:     Cardiac Review of Systems: Y or N  Chest Pain [   n ]  Resting SOB [n   ] Exertional SOB  [n  ]  Orthopnea Milo.Brash  ]   Pedal Edema [ n  ]    Palpitations [ n ] Syncope  [n  ]   Presyncope [ n  ]  General Review of Systems: [Y] = yes [  ]=no Constitional: recent weight change [  ]; anorexia [  ]; fatigue [  ]; nausea [  ]; night sweats [  ]; fever [  ]; or chills [  ];                                                                                                                                          Dental: poor dentition[  ]; Last Dentist visit:   Eye : blurred vision [  ]; diplopia [   ]; vision  changes [  ];  Amaurosis fugax[  ]; Resp: cough [  ];  wheezing[  ];  hemoptysis[  ]; shortness of breath[  ]; paroxysmal nocturnal dyspnea[  ]; dyspnea on exertion[  ]; or orthopnea[  ];  GI:  gallstones[  ], vomiting[  ];  dysphagia[  ]; melena[  ];  hematochezia [  ]; heartburn[  ];   Hx of  Colonoscopy[  ]; GU: kidney stones [  ]; hematuria[  ];   dysuria [  ];  nocturia[  ];  history of     obstruction [  ]; urinary frequency [  ]             Skin: rash, swelling[  ];, hair loss[  ];  peripheral edema[  ];  or itching[  ]; Musculosketetal: myalgias[  ];  joint swelling[  ];  joint erythema[  ];  joint pain[  ];  back pain[  ];  Heme/Lymph: bruising[  ];  bleeding[  ];  anemia[  ];  Neuro: TIA[  ];  headaches[  ];  stroke[  ];  vertigo[  ];  seizures[  ];   paresthesias[  ];   difficulty walking[  ];  Psych:depression[  ]; anxiety[  ];  Endocrine: diabetes[  ];  thyroid dysfunction[  ];  Immunizations: Flu [ y ]; Pneumococcal[ n ];  Other:  Physical Exam: BP 158/78  Pulse 70  Resp 20  Ht 5\' 3"  (1.6 m)  Wt 144 lb (65.318 kg)  BMI 25.51 kg/m2  SpO2 98%  General appearance: alert, cooperative and appears stated age Neurologic: intact Heart: regular rate and rhythm, S1, S2 normal, no murmur, click, rub or gallop and normal apical impulse Lungs: clear to auscultation bilaterally and normal percussion bilaterally Abdomen: soft, non-tender; bowel sounds normal; no masses,  no organomegaly Extremities: extremities normal, atraumatic, no cyanosis or edema and Homans sign is negative, no sign of DVT Patient has no cervical or supraclavicular adenopathy She has no carotid bruits, has full and equal radial brachial and pedal pulses.  Diagnostic Studies & Laboratory data:     Recent Radiology Findings:  Ct Chest Low Dose Pilot W/o Cm  01/15/2013   *RADIOLOGY REPORT*  Clinical Data: Follow-up evaluation of previously noted lung lesions.  CT CHEST LOW DOSE PILOT WITHOUT CONTRAST  Technique: Multidetector CT imaging of the chest using the standard low-dose protocol without administration of intravenous contrast.  Comparison: Chest CT 11/07/2011.  Findings:  Mediastinum: Heart size is normal. There is no significant pericardial fluid, thickening or pericardial calcification. There is atherosclerosis of the thoracic aorta, the great vessels of the mediastinum and the coronary arteries, including calcified atherosclerotic plaque in the left main, left anterior descending and right coronary arteries. No pathologically enlarged mediastinal or hilar lymph nodes. Please note that accurate exclusion of hilar adenopathy is limited on noncontrast CT scans.  Aberrant right subclavian artery (normal anatomical variant) incidentally noted. Esophagus is unremarkable in appearance.   Lungs/Pleura: The previously noted 4 mm right upper lobe ground- glass attenuation nodule is unchanged (image 113 of series 102). In the medial aspect of the left upper lobe (image 60 of series 102) there is a 12 x 8 mm ill-defined ground-glass attenuation nodule which is unchanged.  Likewise, in the periphery of the right lower lobe (image 100 of series 102) is a 1.3 x 0.8 cm pleural- based ground-glass attenuation nodule which is also unchanged.  No definite new suspicious appearing pulmonary nodules or masses are otherwise  identified.  No acute consolidative air space disease. No pleural effusions.  There is a background of mild centrilobular emphysema.  Upper Abdomen: Status post cholecystectomy.  Musculoskeletal: There are no aggressive appearing lytic or blastic lesions noted in the visualized portions of the skeleton.  IMPRESSION: 1.  No significant change in numerous ground-glass attenuation nodules scattered throughout the lungs bilaterally, as above. At this point, these nodules have been stable in size and appearance compared to prior study from 12/12/2010.  An additional 1 year follow-up is recommended at this time.  This recommendation follows the consensus statement: Recommendations for the Management of Subsolid Pulmonary Nodules Detected at CT:  A Statement from the Fleischner Society as published in Radiology 2013; 266:304-317. 2.  Mild centrilobular emphysema. 3. Atherosclerosis, including left main and two-vessel coronary artery disease. Please note that although the presence of coronary artery calcium documents the presence of coronary artery disease, the severity of this disease and any potential stenosis cannot be assessed on this non-gated CT examination.  Assessment for potential risk factor modification, dietary therapy or pharmacologic therapy may be warranted, if clinically indicated. 4.  Aberrant right subclavian artery (normal anatomical variant) incidentally noted. 5.  Status post  cholecystectomy.   Original Report Authenticated By: Trudie Reed, M.D.   Recent Lab Findings: Lab Results  Component Value Date   WBC 8.4 10/15/2011   HGB 12.9 10/15/2011   HCT 38.5 10/15/2011   PLT 295 10/15/2011   GLUCOSE 336* 08/14/2012   CHOL 206* 08/14/2012   TRIG 229.0* 08/14/2012   HDL 40.80 08/14/2012   LDLDIRECT 139.3 08/14/2012   ALT 23 08/14/2012   AST 19 08/14/2012   NA 135 08/14/2012   K 4.3 08/14/2012   CL 100 08/14/2012   CREATININE 1.4* 08/14/2012   BUN 16 08/14/2012   CO2 24 08/14/2012   TSH 0.864 09/27/2011   INR 0.89 10/15/2011   HGBA1C 11.3* 08/14/2012      Assessment / Plan:      1 No significant change in numerous ground-glass attenuation nodules scattered throughout the lungs bilaterally,  Stable since 2012 2 Aberrant right subclavian artery (normal anatomical variant) incidentally noted. 3 Atherosclerosis, including left main and two-vessel coronary artery disease by non gated CT of the chest, asymptomatic. Discussed with patient and recommended immediate if becomes symptomatic  We'll see the patient in one year with followup CT scan to evaluate stability of lung nodules noted to be present since at least 2012.  Delight Ovens MD      [address] [place] [place] [address] Office [phone]   Beeper [phone]  01/15/2013 2:24 PM

## 2013-01-27 ENCOUNTER — Ambulatory Visit: Payer: Self-pay | Admitting: Gastroenterology

## 2013-01-28 ENCOUNTER — Ambulatory Visit: Payer: Self-pay | Admitting: Gastroenterology

## 2013-01-30 ENCOUNTER — Ambulatory Visit: Payer: Self-pay | Admitting: Gastroenterology

## 2013-02-11 ENCOUNTER — Telehealth: Payer: Self-pay | Admitting: *Deleted

## 2013-02-11 NOTE — Telephone Encounter (Signed)
Form for diabetic testing supplies in your IN box for completion. 

## 2013-02-12 NOTE — Telephone Encounter (Signed)
Form signed and on my desk. 

## 2013-02-12 NOTE — Telephone Encounter (Signed)
Form faxed back to [facility].

## 2013-02-26 ENCOUNTER — Other Ambulatory Visit: Payer: Self-pay | Admitting: Internal Medicine

## 2013-02-26 DIAGNOSIS — B1920 Unspecified viral hepatitis C without hepatic coma: Secondary | ICD-10-CM

## 2013-02-27 ENCOUNTER — Telehealth: Payer: Self-pay | Admitting: *Deleted

## 2013-02-27 NOTE — Telephone Encounter (Signed)
Signed and on my desk 

## 2013-02-27 NOTE — Telephone Encounter (Signed)
Prior auth is needed for nexium, form is on your desk. 

## 2013-02-27 NOTE — Telephone Encounter (Signed)
Form faxed back by Jacki Cones

## 2013-03-02 NOTE — Telephone Encounter (Signed)
Received prior auth from [facility] showing that medication has been approved from 01/29/13 until 02/28/14. Letter is in your in box.

## 2013-03-04 ENCOUNTER — Ambulatory Visit
Admission: RE | Admit: 2013-03-04 | Discharge: 2013-03-04 | Disposition: A | Discharge status: Home / Self Care | Payer: Medicare Other | Source: Ambulatory Visit | Attending: Internal Medicine | Admitting: Internal Medicine

## 2013-03-04 DIAGNOSIS — B1920 Unspecified viral hepatitis C without hepatic coma: Secondary | ICD-10-CM

## 2013-06-30 ENCOUNTER — Other Ambulatory Visit: Payer: Self-pay

## 2013-06-30 MED ORDER — GLUCOSE BLOOD VI STRP: ORAL_STRIP | Status: DC

## 2013-06-30 NOTE — Telephone Encounter (Signed)
Amy from [facility] left v/m that [facility] has lost credentialing for part B diabetic test strips; Amy tried to transfer to [facility] but Amy was told that medicare guidelines now require a hard copy of prescription for diabetic test strips including directions and diagnosis. Amy request our office to contact pt when rx ready for pick up.

## 2013-06-30 NOTE — Telephone Encounter (Signed)
Spoke to pt who states that she has now established care with a new provider at [facility], and he has provided her with a hard copy Rx for test strips.

## 2013-07-01 ENCOUNTER — Ambulatory Visit: Payer: Self-pay | Admitting: Internal Medicine

## 2013-07-01 NOTE — Telephone Encounter (Signed)
Noted! Thank you

## 2013-10-12 IMAGING — US US ABDOMEN LIMITED
1 series · 14 of 25 positions shown · non-contrast
Comparison: CT abdomen of 12/12/2010

CLINICAL DATA: Hepatitis-C by history, surveillance for hepatoma

EXAM:
US ABDOMEN LIMITED - RIGHT UPPER QUADRANT

[Series 1: us abdomen limited · 0.26mm/px · 14 of 45 slices shown]
[im 1/45]
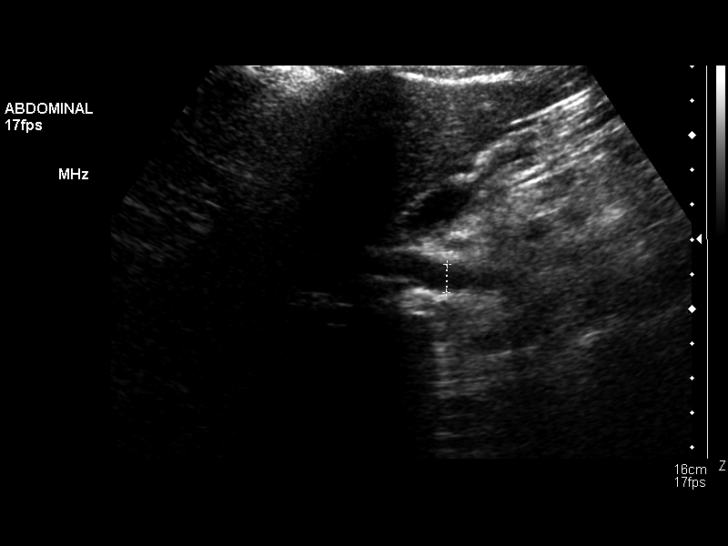
[im 4/45]
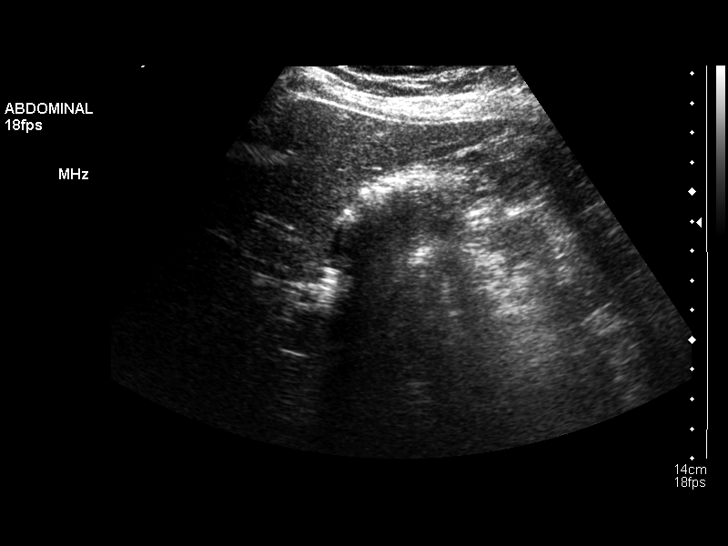
[im 8/45]
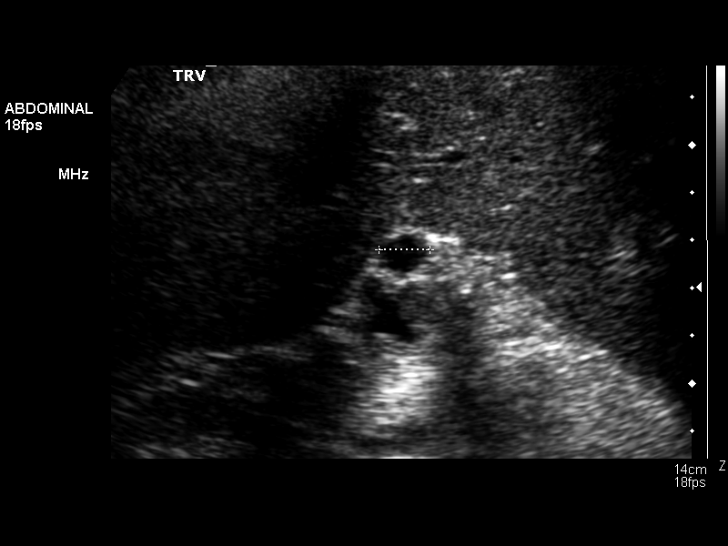
[im 12/45]
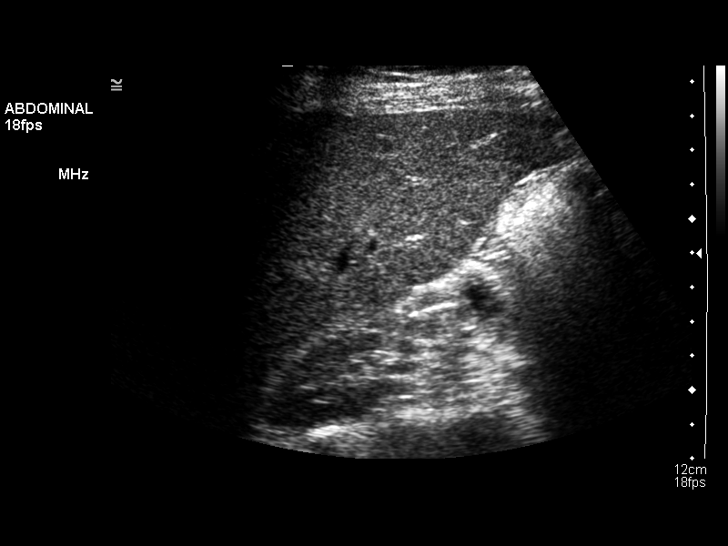
[im 15/45]
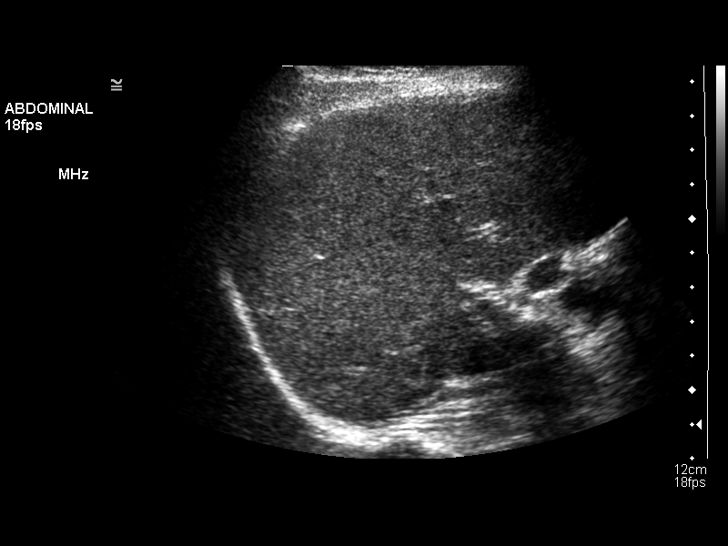
[im 17/45]
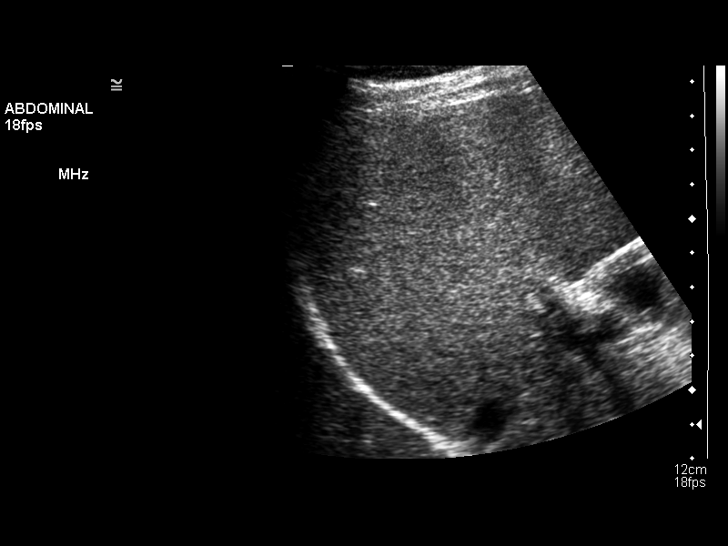
[im 21/45]
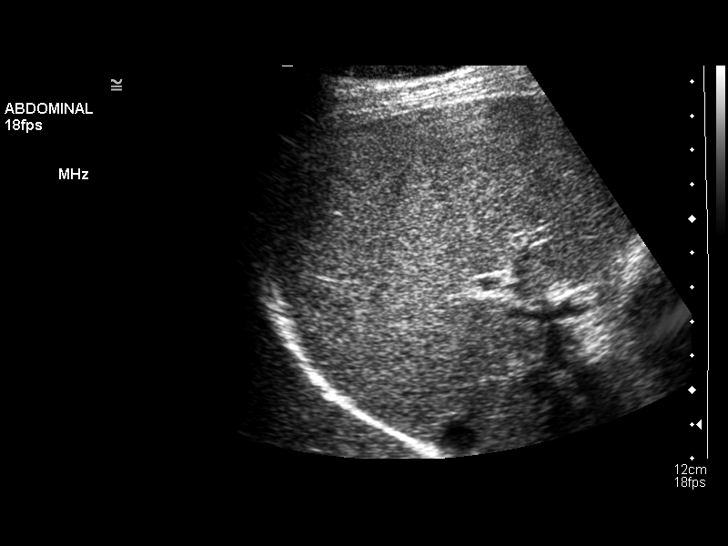
[im 24/45]
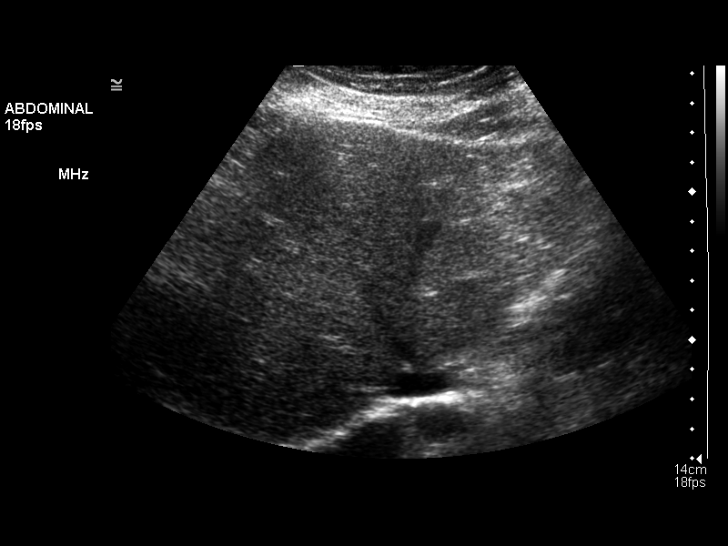
[im 28/45]
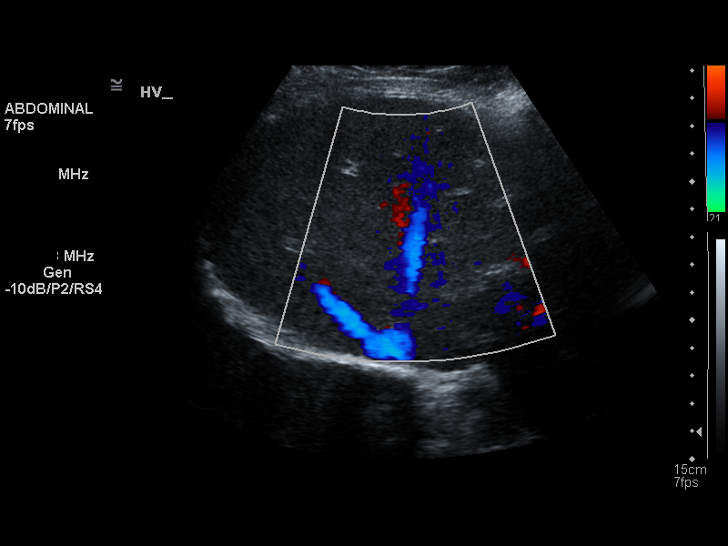
[im 30/45]
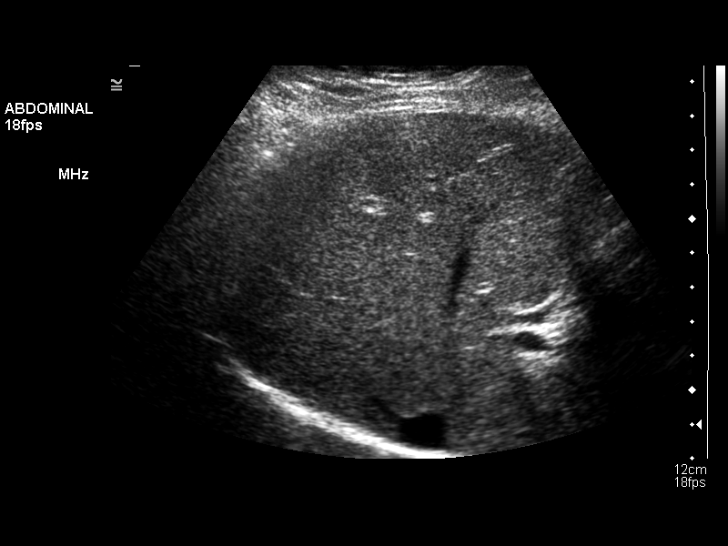
[im 34/45]
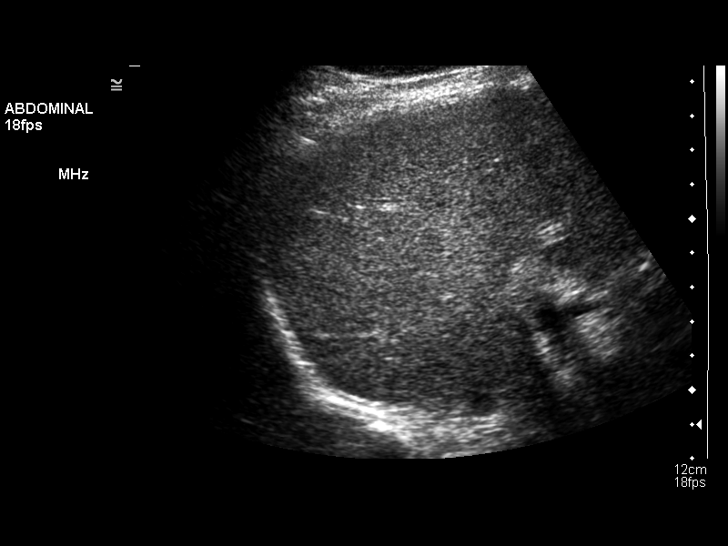
[im 37/45]
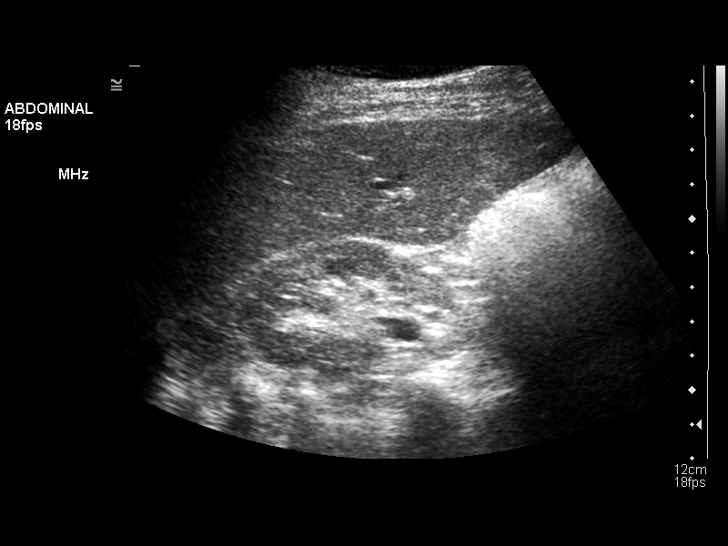
[im 41/45]
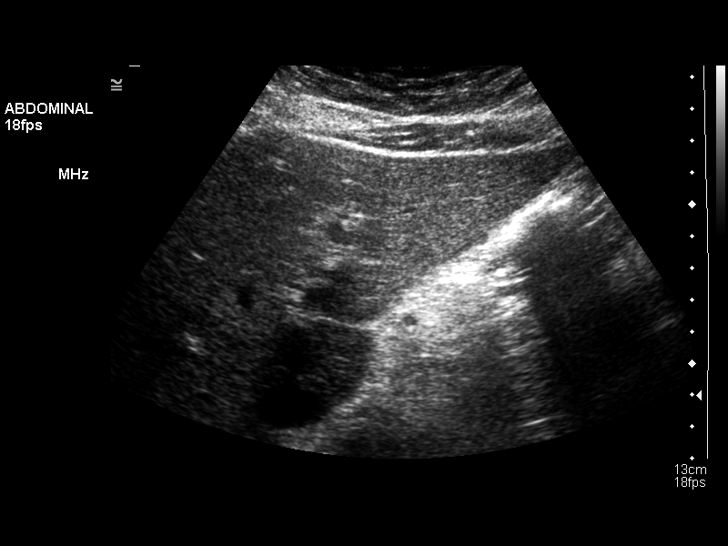
[im 45/45]
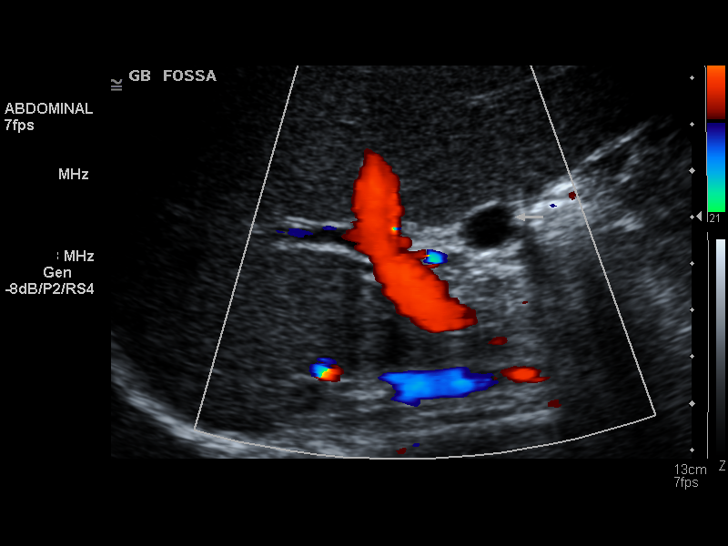

[14 of 25 positions shown; findings below may reference images not displayed]

FINDINGS: Gallbladder:

The gallbladder has previously been resected. There is a cystic area
that is noted within the gallbladder fossa of 1.7 x 1.0 x 1.1 cm
with no complicating features. This persists on delayed images and
is of questionable significance.

Common bile duct

Diameter: The common bile duct is normal post cholecystectomy
measuring 8.1 mm in diameter.

Liver:

No focal hepatic lesion is seen.
IMPRESSION: 1. No hepatic lesion is seen.
2. Cystic area in the gallbladder fossa post cholecystectomy of
questionable significance.

## 2013-12-18 ENCOUNTER — Other Ambulatory Visit: Payer: Self-pay | Admitting: Family Medicine

## 2013-12-22 DIAGNOSIS — M810 Age-related osteoporosis without current pathological fracture: Secondary | ICD-10-CM | POA: Insufficient documentation

## 2014-02-02 ENCOUNTER — Other Ambulatory Visit: Payer: Self-pay | Admitting: Nurse Practitioner

## 2014-02-02 DIAGNOSIS — C220 Liver cell carcinoma: Secondary | ICD-10-CM

## 2014-02-11 ENCOUNTER — Ambulatory Visit
Admission: RE | Admit: 2014-02-11 | Discharge: 2014-02-11 | Disposition: A | Discharge status: Home / Self Care | Payer: Medicare Other | Source: Ambulatory Visit | Attending: Nurse Practitioner | Admitting: Nurse Practitioner

## 2014-02-11 DIAGNOSIS — C220 Liver cell carcinoma: Secondary | ICD-10-CM

## 2014-02-23 ENCOUNTER — Other Ambulatory Visit: Payer: Self-pay | Admitting: *Deleted

## 2014-02-23 DIAGNOSIS — R918 Other nonspecific abnormal finding of lung field: Secondary | ICD-10-CM

## 2014-03-02 ENCOUNTER — Ambulatory Visit
Admission: RE | Admit: 2014-03-02 | Discharge: 2014-03-02 | Disposition: A | Discharge status: Home / Self Care | Payer: Medicare Other | Source: Ambulatory Visit | Attending: Cardiothoracic Surgery | Admitting: Cardiothoracic Surgery

## 2014-03-02 DIAGNOSIS — R918 Other nonspecific abnormal finding of lung field: Secondary | ICD-10-CM

## 2014-03-03 ENCOUNTER — Ambulatory Visit (INDEPENDENT_AMBULATORY_CARE_PROVIDER_SITE_OTHER): Payer: Medicare Other | Admitting: Cardiothoracic Surgery

## 2014-03-03 ENCOUNTER — Encounter: Payer: Self-pay | Admitting: Cardiothoracic Surgery

## 2014-03-03 VITALS — BP 151/77 | HR 75 | Ht 63.0 in | Wt 144.0 lb

## 2014-03-03 DIAGNOSIS — R918 Other nonspecific abnormal finding of lung field: Secondary | ICD-10-CM

## 2014-03-03 NOTE — Progress Notes (Signed)
[address]       [place] [address]             [phone]                    Amee Kenealy [place] Medical Record #[id number] Date of Birth: 1946/03/27  Referring: Lucille Passy, MD Primary Care: No primary provider on file.  Chief Complaint:    No chief complaint on file.   History of Present Illness:      patient returns for followup. She has a left upper lobe and right lower lobe groundglass nodules and several other small scattered nodules in the past and followed bt Dr Arlyce Dice. CT scan today showed no change in these groundglass nodules.  Dr Arlyce Dice had recommended  plan to repeat another CT scan in 6 months.  She has had no other pulmonary symptoms. She remains non smoking.  She has no symptoms of coronary artery disease no angina or chest discomfort.    Current Activity/ Functional Status:  Patient is independent with mobility/ambulation, transfers, ADL's, IADL's.  Zubrod Score: At the time of surgery this patient's most appropriate activity status/level should be described as: [x]  Normal activity, no symptoms []  Symptoms, fully ambulatory []  Symptoms, in bed less than or equal to 50% of the time []  Symptoms, in bed greater than 50% of the time but less than 100% []  Bedridden []  Moribund   Past Medical History  Diagnosis Date  . Allergy   . Diabetes mellitus   . GERD (gastroesophageal reflux disease)   . Hyperlipidemia   . Hypertension   . Pancreatitis   . Osteoporosis   . History of colon polyps   . Hepatitis     Past Surgical History  Procedure Laterality Date  . Abdominal hysterectomy    . Cholecystectomy    . Tonsillectomy    . Hand surgery      Family History  Problem Relation Age of Onset  . Breast cancer Mother   . Colon cancer Neg Hx   . Diabetes Mother   . Heart disease Mother     History   Social History  . Marital Status: Single    Spouse Name: N/A    Number of Children: 0  . Years of Education: N/A    Occupational History  . Retired- Restaurant manager, fast food    Social History Main Topics  . Smoking status: Former Smoker    Quit date: 06/11/2008  . Smokeless tobacco: Never Used  . Alcohol Use: No  . Drug Use: No  . Sexually Active: Not on file   Other Topics Concern  . Not on file   Social History Narrative    moved here from [place] to take care of mother with Alzheimers, who passed away last week        1 caffeine drink daily     History  Smoking status  . Former Smoker  . Quit date: 06/11/2008  Smokeless tobacco  . Never Used    History  Alcohol Use No     Allergies  Allergen Reactions  . Codeine     REACTION: Hives, tongue swells  . Penicillins     REACTION: rash and itching  . Sulfa Drugs Cross Reactors     itiching    Current Outpatient Prescriptions  Medication Sig Dispense Refill  . alendronate (FOSAMAX) 70 MG tablet Take 70 mg by mouth every 7 (seven) days.       Marland Kitchen  cetirizine (ZYRTEC) 10 MG tablet Take 10 mg by mouth daily.        Marland Kitchen esomeprazole (NEXIUM) 40 MG capsule TAKE 1 CAPSULE (40MG  TOTAL ) BY MOUTH 2 TIMES DAILY  180 capsule  1  . fluticasone (FLONASE) 50 MCG/ACT nasal spray USE 2 SPRAYS IN EACH NOSTRIL DAILY  48 g  1  . glimepiride (AMARYL) 4 MG tablet Take 1 tablet (4 mg total) by mouth 2 (two) times daily before a meal.  180 tablet  1  . glucose blood (ONE TOUCH ULTRA TEST) test strip Check blood sugar twice daily and as directed. Dx 250.4  100 each  5  . insulin glargine (LANTUS SOLOSTAR) 100 UNIT/ML injection Inject 15 units twice a day.  15 pen  1  . Insulin Pen Needle (ULTICARE MINI PEN NEEDLES) 31G X 6 MM MISC Use to inject insulin daily  200 each  1  . lisinopril (PRINIVIL,ZESTRIL) 5 MG tablet Take one tablet by mouth daily  90 tablet  0  . Multiple Vitamin (MULITIVITAMIN WITH MINERALS) TABS Take 1 tablet by mouth daily. 50+      . ONE TOUCH LANCETS MISC Use to check blood sugar twice a day      . ranitidine (ZANTAC) 300 MG  capsule Take 1 capsule (300 mg total) by mouth 2 (two) times daily.  180 capsule  1  . sitaGLIPtin (JANUVIA) 100 MG tablet Take 1 tablet (100 mg total) by mouth daily.  90 tablet  1  . Vitamin D, Ergocalciferol, (DRISDOL) 50000 UNITS CAPS capsule        No current facility-administered medications for this visit.       Review of Systems:     Cardiac Review of Systems: Y or N  Chest Pain [   n ]  Resting SOB [n   ] Exertional SOB  [n  ]  Orthopnea Florencio.Farrier  ]   Pedal Edema [ n  ]    Palpitations [ n ] Syncope  [n  ]   Presyncope [ n  ]  General Review of Systems: [Y] = yes [  ]=no Constitional: recent weight change [  ]; anorexia [  ]; fatigue [  ]; nausea [  ]; night sweats [  ]; fever [  ]; or chills [  ];                                                                                                                                          Dental: poor dentition[  ]; Last Dentist visit:   Eye : blurred vision [  ]; diplopia [   ]; vision changes [  ];  Amaurosis fugax[  ]; Resp: cough [  ];  wheezing[  ];  hemoptysis[  ]; shortness of breath[  ]; paroxysmal nocturnal dyspnea[  ]; dyspnea on exertion[  ]; or orthopnea[  ];  GI:  gallstones[  ], vomiting[  ];  dysphagia[  ]; melena[  ];  hematochezia [  ]; heartburn[  ];   Hx of  Colonoscopy[  ]; GU: kidney stones [  ]; hematuria[  ];   dysuria [  ];  nocturia[  ];  history of     obstruction [  ]; urinary frequency [  ]             Skin: rash, swelling[  ];, hair loss[  ];  peripheral edema[  ];  or itching[  ]; Musculosketetal: myalgias[  ];  joint swelling[  ];  joint erythema[  ];  joint pain[  ];  back pain[  ];  Heme/Lymph: bruising[  ];  bleeding[  ];  anemia[  ];  Neuro: TIA[  ];  headaches[  ];  stroke[  ];  vertigo[  ];  seizures[  ];   paresthesias[  ];  difficulty walking[  ];  Psych:depression[  ]; anxiety[  ];  Endocrine: diabetes[  ];  thyroid dysfunction[  ];  Immunizations: Flu [ y ]; Pneumococcal[ n ];  Other:  Physical  Exam: There were no vitals taken for this visit.  General appearance: alert, cooperative and appears stated age Neurologic: intact Heart: regular rate and rhythm, S1, S2 normal, no murmur, click, rub or gallop and normal apical impulse Lungs: clear to auscultation bilaterally and normal percussion bilaterally Abdomen: soft, non-tender; bowel sounds normal; no masses,  no organomegaly Extremities: extremities normal, atraumatic, no cyanosis or edema and Homans sign is negative, no sign of DVT Patient has no cervical or supraclavicular adenopathy She has no carotid bruits, has full and equal radial brachial and pedal pulses.  Diagnostic Studies & Laboratory data:     Recent Radiology Findings:  Ct Chest Wo Contrast  03/02/2014   CLINICAL DATA:  Fall pulmonary nodules  EXAM: CT CHEST WITHOUT CONTRAST  TECHNIQUE: Multidetector CT imaging of the chest was performed following the standard protocol without IV contrast.  COMPARISON:  None.  FINDINGS: Left upper lobe pulmonary nodule measuring 9 mm is unchanged from prior this nodule is stable over 3 years.  The right lower lobe pulmonary nodule has change in morphology from a ground-glass nodule to a solid nodule measuring 10 mm about 5 mm and has an elongated morphology (image 34, series 4). Overall size lesion is not changed as previously measured 15 x 9 mm. No new pulmonary nodules present.  No axillary supraclavicular lymphadenopathy. No mediastinal hilar lymphadenopathy. No pericardial fluid. Adrenal glands are normal. Postcholecystectomy. No aggressive osseous lesion.  IMPRESSION: 1. Stable left upper lobe pulmonary nodule over 3 year distal benign etiology. 2. Change in morphology of the right lower lobe from ground-glass to solid nodule. Recommend follow-up CT without contrast in 12 months to reassess this changing but not enlarging nodule.   Electronically Signed   By: Suzy Bouchard M.D.   On: 03/02/2014 14:59   US Abdomen Limited  Ruq  02/11/2014   CLINICAL DATA:  Hepatoma surveillance, on medication for hypertension  EXAM: US ABDOMEN LIMITED - RIGHT UPPER QUADRANT  COMPARISON:  Ultrasound of the right upper quadrant of 03/04/2013  FINDINGS: Gallbladder:  The gallbladder has previously been resected. As noted on the prior ultrasound from 2014, a small cystic structure remains in the gallbladder fossa of 1.3 x 0.9 x 1.1 cm and is unchanged, of questionable significance. No peristalsis is seen in this region.  Common bile duct:  Diameter: The common bile duct is  within normal limits post cholecystectomy measuring 7.7 mm in diameter.  Liver:  The liver has a normal echogenic pattern. No focal hepatic abnormality is seen.  IMPRESSION: 1. No focal hepatic abnormality. 2. No change in a small cystic structure in the gallbladder fossa of questionable significance.   Electronically Signed   By: Ivar Drape M.D.   On: 02/11/2014 09:11  Ct Chest Low Dose Pilot W/o Cm  01/15/2013   *RADIOLOGY REPORT*  Clinical Data: Follow-up evaluation of previously noted lung lesions.  CT CHEST LOW DOSE PILOT WITHOUT CONTRAST  Technique: Multidetector CT imaging of the chest using the standard low-dose protocol without administration of intravenous contrast.  Comparison: Chest CT 11/07/2011.  Findings:  Mediastinum: Heart size is normal. There is no significant pericardial fluid, thickening or pericardial calcification. There is atherosclerosis of the thoracic aorta, the great vessels of the mediastinum and the coronary arteries, including calcified atherosclerotic plaque in the left main, left anterior descending and right coronary arteries. No pathologically enlarged mediastinal or hilar lymph nodes. Please note that accurate exclusion of hilar adenopathy is limited on noncontrast CT scans.  Aberrant right subclavian artery (normal anatomical variant) incidentally noted. Esophagus is unremarkable in appearance.  Lungs/Pleura: The previously noted 4 mm right upper  lobe ground- glass attenuation nodule is unchanged (image 113 of series 102). In the medial aspect of the left upper lobe (image 60 of series 102) there is a 12 x 8 mm ill-defined ground-glass attenuation nodule which is unchanged.  Likewise, in the periphery of the right lower lobe (image 100 of series 102) is a 1.3 x 0.8 cm pleural- based ground-glass attenuation nodule which is also unchanged.  No definite new suspicious appearing pulmonary nodules or masses are otherwise identified.  No acute consolidative air space disease. No pleural effusions.  There is a background of mild centrilobular emphysema.  Upper Abdomen: Status post cholecystectomy.  Musculoskeletal: There are no aggressive appearing lytic or blastic lesions noted in the visualized portions of the skeleton.  IMPRESSION: 1.  No significant change in numerous ground-glass attenuation nodules scattered throughout the lungs bilaterally, as above. At this point, these nodules have been stable in size and appearance compared to prior study from 12/12/2010.  An additional 1 year follow-up is recommended at this time.  This recommendation follows the consensus statement: Recommendations for the Management of Subsolid Pulmonary Nodules Detected at CT:  A Statement from the [employer] as published in Radiology 2013; 266:304-317. 2.  Mild centrilobular emphysema. 3. Atherosclerosis, including left main and two-vessel coronary artery disease. Please note that although the presence of coronary artery calcium documents the presence of coronary artery disease, the severity of this disease and any potential stenosis cannot be assessed on this non-gated CT examination.  Assessment for potential risk factor modification, dietary therapy or pharmacologic therapy may be warranted, if clinically indicated. 4.  Aberrant right subclavian artery (normal anatomical variant) incidentally noted. 5.  Status post cholecystectomy.   Original Report Authenticated By:  Vinnie Langton, M.D.   Recent Lab Findings: Lab Results  Component Value Date   WBC 8.4 10/15/2011   HGB 12.9 10/15/2011   HCT 38.5 10/15/2011   PLT 295 10/15/2011   GLUCOSE 336* 08/14/2012   CHOL 206* 08/14/2012   TRIG 229.0* 08/14/2012   HDL 40.80 08/14/2012   LDLDIRECT 139.3 08/14/2012   ALT 23 08/14/2012   AST 19 08/14/2012   NA 135 08/14/2012   K 4.3 08/14/2012   CL 100 08/14/2012   CREATININE 1.4*  08/14/2012   BUN 16 08/14/2012   CO2 24 08/14/2012   TSH 0.864 09/27/2011   INR 0.89 10/15/2011   HGBA1C 11.3* 08/14/2012      Assessment / Plan:      1 No significant change in numerous ground-glass attenuation nodules scattered throughout the lungs bilaterally,  Stable since 2012 2 Aberrant right subclavian artery (normal anatomical variant) incidentally noted. 3 Atherosclerosis, including left main and two-vessel coronary artery disease by non gated CT of the chest, asymptomatic. Discussed with patient and recommended immediate if becomes symptomatic 4 the groundglass area in the right lower lobe that has been present previously has become slightly more solid and smaller. We'll plan to repeat the CT scan in 6 months to evaluate the right lung nodule, a left lung nodules have been stable for more than 3 years. We'll see the patient in 6 months with followup CT scan to evaluate stability of lung nodules noted to be present since at least 2012.  Grace Isaac MD      [address] [place] [address] Office [phone]   Beeper [phone]  03/03/2014 2:15 PM

## 2014-06-01 ENCOUNTER — Other Ambulatory Visit (HOSPITAL_COMMUNITY): Payer: Self-pay | Admitting: Neurosurgery

## 2014-06-17 ENCOUNTER — Encounter (HOSPITAL_COMMUNITY)
Admission: RE | Admit: 2014-06-17 | Discharge: 2014-06-17 | Disposition: A | Discharge status: Home / Self Care | Payer: Medicare Other | Source: Ambulatory Visit | Attending: Neurosurgery | Admitting: Neurosurgery

## 2014-06-17 ENCOUNTER — Encounter (HOSPITAL_COMMUNITY): Payer: Self-pay

## 2014-06-17 DIAGNOSIS — I129 Hypertensive chronic kidney disease with stage 1 through stage 4 chronic kidney disease, or unspecified chronic kidney disease: Secondary | ICD-10-CM | POA: Diagnosis not present

## 2014-06-17 DIAGNOSIS — Z7982 Long term (current) use of aspirin: Secondary | ICD-10-CM | POA: Insufficient documentation

## 2014-06-17 DIAGNOSIS — B1920 Unspecified viral hepatitis C without hepatic coma: Secondary | ICD-10-CM | POA: Insufficient documentation

## 2014-06-17 DIAGNOSIS — I272 Other secondary pulmonary hypertension: Secondary | ICD-10-CM | POA: Diagnosis not present

## 2014-06-17 DIAGNOSIS — M810 Age-related osteoporosis without current pathological fracture: Secondary | ICD-10-CM | POA: Insufficient documentation

## 2014-06-17 DIAGNOSIS — E1140 Type 2 diabetes mellitus with diabetic neuropathy, unspecified: Secondary | ICD-10-CM | POA: Diagnosis not present

## 2014-06-17 DIAGNOSIS — E049 Nontoxic goiter, unspecified: Secondary | ICD-10-CM | POA: Insufficient documentation

## 2014-06-17 DIAGNOSIS — E039 Hypothyroidism, unspecified: Secondary | ICD-10-CM | POA: Insufficient documentation

## 2014-06-17 DIAGNOSIS — N183 Chronic kidney disease, stage 3 (moderate): Secondary | ICD-10-CM | POA: Insufficient documentation

## 2014-06-17 DIAGNOSIS — R918 Other nonspecific abnormal finding of lung field: Secondary | ICD-10-CM | POA: Diagnosis not present

## 2014-06-17 DIAGNOSIS — Z01818 Encounter for other preprocedural examination: Secondary | ICD-10-CM | POA: Insufficient documentation

## 2014-06-17 HISTORY — DX: Personal history of other medical treatment: Z92.89

## 2014-06-17 HISTORY — DX: Adverse effect of unspecified anesthetic, initial encounter: T41.45XA

## 2014-06-17 HISTORY — DX: Other complications of anesthesia, initial encounter: T88.59XA

## 2014-06-17 HISTORY — DX: Other specified postprocedural states: R11.2

## 2014-06-17 HISTORY — DX: Other specified postprocedural states: Z98.890

## 2014-06-17 HISTORY — DX: Benign neoplasm of connective and other soft tissue, unspecified: D21.9

## 2014-06-17 HISTORY — DX: Nontoxic goiter, unspecified: E04.9

## 2014-06-17 HISTORY — DX: Other seasonal allergic rhinitis: J30.2

## 2014-06-17 HISTORY — DX: Chronic kidney disease, unspecified: N18.9

## 2014-06-17 LAB — COMPREHENSIVE METABOLIC PANEL
ALBUMIN: 3.8 g/dL (ref 3.5–5.2)
ALK PHOS: 83 U/L (ref 39–117)
ALT: 18 U/L (ref 0–35)
AST: 22 U/L (ref 0–37)
Anion gap: 8 (ref 5–15)
BILIRUBIN TOTAL: 0.3 mg/dL (ref 0.3–1.2)
BUN: 28 mg/dL — ABNORMAL HIGH (ref 6–23)
CHLORIDE: 106 meq/L (ref 96–112)
CO2: 23 mmol/L (ref 19–32)
Calcium: 9.2 mg/dL (ref 8.4–10.5)
Creatinine, Ser: 1.92 mg/dL — ABNORMAL HIGH (ref 0.50–1.10)
GFR calc Af Amer: 30 mL/min — ABNORMAL LOW (ref >90)
GFR calc non Af Amer: 26 mL/min — ABNORMAL LOW (ref >90)
Glucose, Bld: 158 mg/dL — ABNORMAL HIGH (ref 70–99)
POTASSIUM: 3.8 mmol/L (ref 3.5–5.1)
Sodium: 137 mmol/L (ref 135–145)
Total Protein: 7.3 g/dL (ref 6.0–8.3)

## 2014-06-17 LAB — CBC
HEMATOCRIT: 37 % (ref 36.0–46.0)
HEMOGLOBIN: 11.8 g/dL — AB (ref 12.0–15.0)
MCH: 26.6 pg (ref 26.0–34.0)
MCHC: 31.9 g/dL (ref 30.0–36.0)
MCV: 83.5 fL (ref 78.0–100.0)
Platelets: 370 10*3/uL (ref 150–400)
RBC: 4.43 MIL/uL (ref 3.87–5.11)
RDW: 13 % (ref 11.5–15.5)
WBC: 9.6 10*3/uL (ref 4.0–10.5)

## 2014-06-17 LAB — SURGICAL PCR SCREEN
MRSA, PCR: NEGATIVE
Staphylococcus aureus: NEGATIVE

## 2014-06-17 NOTE — Progress Notes (Signed)
PCP is Dr Lisette Grinder, [facility].  Patient reports CBG run in 180's, and she had a hgb A1C last year. I requested labs and last office notes.  Audrey Green reports that she saw a Dr at [facility] last year, requested by medical MD, patient denies chest pain or light headedness.  Audrey Green stated that cardiologist started her on Aspirin because she had a "little blockage."  I have requested office notes, EKG, Echo, Stress test and to get ok to stop Aspirin pre op.

## 2014-06-17 NOTE — Progress Notes (Signed)
Anesthesia Chart Review:  Patient is a 69 year old female scheduled for left L4-5 microdiskectomy on 06/24/14 by Dr. Christella Noa.  History includes non-smoker, bilateral lung nodules (followed by Dr. Servando Snare with serial CT scans), DM2 with peripheral neuropathy, hypothyroidism, goiter, HTN, hepatitis C (from blood transfusion), osteoporosis, CKD stage III.   PCP is Dr. Lisette Grinder. Endocrinologist is Dr. Lucilla Lame. She was referred to cardiologist Dr. Lujean Amel with [facility]) last year due to chest pain with CAD risk factors and coronary calcifications on CT, but had an unremarkable cardiac work-up (see below). Dr. Clayborn Bigness last saw patient on 01/25/14 with aerobic exercise with one year follow-up recommended. Staff at [facility] instructed patient to hold ASA for surgery.  Nuclear stress test 01/07/14: FINDINGS:  Regional wall motion:reveals normal myocardial thickening and wall motion. LVEF 81%. The overall quality of the study is good.Artifacts noted: No. Left ventricular cavity: normal. Perfusion Analysis:SPECT images demonstrate homogeneous tracer distribution throughout the myocardium.   Echo 01/07/14: INTERPRETATION: NORMAL LEFT VENTRICULAR SYSTOLIC FUNCTION WITH MODERATE LVH MILD VALVULAR REGURGITATION (See above) NO VALVULAR STENOSIS Mild PHTN Normal overall left ventricular function with ejection fraction greater than 55%  12/22/13 EKG is still pending from [facility], but by notes was WNL.  03/02/14 Chest CT: 1. Stable left upper lobe pulmonary nodule over 3 year distal benign etiology. 2. Change in morphology of the right lower lobe from ground-glass to solid nodule. Recommend follow-up CT without contrast in 12 months to reassess this changing but not enlarging nodule.  Preoperative labs noted.  BUN 28, Cr 1.92, previously 27/1.8 on 03/26/14 (Care Everywhere; patient with known CKD III). Glucose 158. AST/ALT WNL. H/H 11.8/37.0. A1C was 9.5 on 04/05/14.  She will get a fasting CBG on arrival.   Patient with recent cardiac testing. She denied chest pain at PAT.  Overall, renal function appears stable.  If no acute changes then I anticipate that she can proceed as planned.  Chart will be left for follow-up regarding EKG tracing from [place].  George Hugh [facility] Short Stay Center/Anesthesiology Phone [phone] 06/17/2014 4:12 PM

## 2014-06-17 NOTE — Pre-Procedure Instructions (Signed)
Audrey Green  06/17/2014   Your procedure is scheduled on:  Thursday, January 14.  Report to [facility] Admitting at 11:00 AM.  Call this number if you have problems the morning of surgery: [phone]                   For any other questions, please call [phone], Monday - Friday 8 AM - 4 PM.  Remember:   Do not eat food or drink liquids after midnight Wednesday, January 13.   Take these medicines the morning of surgery with A SIP OF WATER: esomeprazole ([place]),  ranitidine (ZANTAC).                  Stop taking Vitamins and Aspirin today.                Do not take Medication for diabetes the morning of surgery.   Do not wear jewelry, make-up or nail polish.  Do not wear lotions, powders, or perfumes.   Do not shave 48 hours prior to surgery.  Do not bring valuables to the hospital.               [facility] is not responsible for any belongings or valuables.               Contacts, dentures or bridgework may not be worn into surgery.  Leave suitcase in the car. After surgery it may be brought to your room.  For patients admitted to the hospital, discharge time is determined by your treatment team.               Patients discharged the day of surgery will not be allowed to drive home.  Name and phone number of your driver:   Special Instructions: Review  [place] - Preparing For Surgery.   Please read over the following fact sheets that you were given: Pain Booklet, Coughing and Deep Breathing and Surgical Site Infection Prevention

## 2014-06-17 NOTE — Progress Notes (Signed)
I spoke with Torias , with [facility] Cardiology and asked about stopping  Aspirin and she informed me that they do stop Aspirin pre op.  I spoke with Ms Roediger and instructed her to stop Aspirin today.

## 2014-06-18 NOTE — Progress Notes (Signed)
[facility] called and re-requested EKG and inquired if pt was instructed about holding Apirin.  Office to fax the EKG and send message to Dr Gilford Rile re Aspirin

## 2014-06-23 MED ORDER — VANCOMYCIN HCL IN DEXTROSE 1-5 GM/200ML-% IV SOLN
1000.0000 mg | INTRAVENOUS | Status: AC
  Administered 2014-06-24: 1000 mg via INTRAVENOUS
  Filled 2014-06-23: qty 200

## 2014-06-24 ENCOUNTER — Encounter (HOSPITAL_COMMUNITY): Payer: Self-pay | Admitting: *Deleted

## 2014-06-24 ENCOUNTER — Ambulatory Visit (HOSPITAL_COMMUNITY): Payer: Medicare Other | Admitting: Anesthesiology

## 2014-06-24 ENCOUNTER — Ambulatory Visit (HOSPITAL_COMMUNITY)
Admission: RE | Admit: 2014-06-24 | Discharge: 2014-06-25 | Disposition: A | Discharge status: Home / Self Care | Payer: Medicare Other | Source: Ambulatory Visit | Attending: Neurosurgery | Admitting: Neurosurgery

## 2014-06-24 ENCOUNTER — Ambulatory Visit (HOSPITAL_COMMUNITY): Payer: Medicare Other | Admitting: Vascular Surgery

## 2014-06-24 ENCOUNTER — Ambulatory Visit (HOSPITAL_COMMUNITY): Payer: Medicare Other

## 2014-06-24 ENCOUNTER — Encounter (HOSPITAL_COMMUNITY)
Admission: RE | Disposition: A | Discharge status: Home / Self Care | Payer: Self-pay | Source: Ambulatory Visit | Attending: Neurosurgery

## 2014-06-24 DIAGNOSIS — Z888 Allergy status to other drugs, medicaments and biological substances status: Secondary | ICD-10-CM | POA: Insufficient documentation

## 2014-06-24 DIAGNOSIS — Z87891 Personal history of nicotine dependence: Secondary | ICD-10-CM | POA: Insufficient documentation

## 2014-06-24 DIAGNOSIS — Z880 Allergy status to penicillin: Secondary | ICD-10-CM | POA: Insufficient documentation

## 2014-06-24 DIAGNOSIS — B182 Chronic viral hepatitis C: Secondary | ICD-10-CM | POA: Diagnosis not present

## 2014-06-24 DIAGNOSIS — I739 Peripheral vascular disease, unspecified: Secondary | ICD-10-CM | POA: Insufficient documentation

## 2014-06-24 DIAGNOSIS — K219 Gastro-esophageal reflux disease without esophagitis: Secondary | ICD-10-CM | POA: Insufficient documentation

## 2014-06-24 DIAGNOSIS — I129 Hypertensive chronic kidney disease with stage 1 through stage 4 chronic kidney disease, or unspecified chronic kidney disease: Secondary | ICD-10-CM | POA: Insufficient documentation

## 2014-06-24 DIAGNOSIS — N189 Chronic kidney disease, unspecified: Secondary | ICD-10-CM | POA: Diagnosis not present

## 2014-06-24 DIAGNOSIS — Z885 Allergy status to narcotic agent status: Secondary | ICD-10-CM | POA: Insufficient documentation

## 2014-06-24 DIAGNOSIS — E119 Type 2 diabetes mellitus without complications: Secondary | ICD-10-CM | POA: Insufficient documentation

## 2014-06-24 DIAGNOSIS — Z7982 Long term (current) use of aspirin: Secondary | ICD-10-CM | POA: Insufficient documentation

## 2014-06-24 DIAGNOSIS — Z794 Long term (current) use of insulin: Secondary | ICD-10-CM | POA: Insufficient documentation

## 2014-06-24 DIAGNOSIS — Z79899 Other long term (current) drug therapy: Secondary | ICD-10-CM | POA: Insufficient documentation

## 2014-06-24 DIAGNOSIS — M5126 Other intervertebral disc displacement, lumbar region: Secondary | ICD-10-CM | POA: Diagnosis present

## 2014-06-24 DIAGNOSIS — Z419 Encounter for procedure for purposes other than remedying health state, unspecified: Secondary | ICD-10-CM

## 2014-06-24 DIAGNOSIS — Z882 Allergy status to sulfonamides status: Secondary | ICD-10-CM | POA: Insufficient documentation

## 2014-06-24 HISTORY — PX: LUMBAR LAMINECTOMY/DECOMPRESSION MICRODISCECTOMY: SHX5026

## 2014-06-24 LAB — GLUCOSE, CAPILLARY
GLUCOSE-CAPILLARY: 136 mg/dL — AB (ref 70–99)
GLUCOSE-CAPILLARY: 186 mg/dL — AB (ref 70–99)
Glucose-Capillary: 141 mg/dL — ABNORMAL HIGH (ref 70–99)

## 2014-06-24 SURGERY — LUMBAR LAMINECTOMY/DECOMPRESSION MICRODISCECTOMY 1 LEVEL
Anesthesia: General | Site: Back | Laterality: Left

## 2014-06-24 MED ORDER — ADULT MULTIVITAMIN W/MINERALS CH
1.0000 | ORAL_TABLET | Freq: Every day | ORAL | Status: DC
  Administered 2014-06-25: 1 via ORAL
  Filled 2014-06-24: qty 1

## 2014-06-24 MED ORDER — HEMOSTATIC AGENTS (NO CHARGE) OPTIME
TOPICAL | Status: DC | PRN
  Administered 2014-06-24: 1 via TOPICAL

## 2014-06-24 MED ORDER — INSULIN ASPART 100 UNIT/ML ~~LOC~~ SOLN: 0.0000 [IU] | Freq: Three times a day (TID) | SUBCUTANEOUS | Status: DC

## 2014-06-24 MED ORDER — MENTHOL 3 MG MT LOZG: 1.0000 | LOZENGE | OROMUCOSAL | Status: DC | PRN

## 2014-06-24 MED ORDER — PROPOFOL 10 MG/ML IV BOLUS
INTRAVENOUS | Status: DC | PRN
  Administered 2014-06-24: 130 mg via INTRAVENOUS

## 2014-06-24 MED ORDER — SODIUM CHLORIDE 0.9 % IJ SOLN: 3.0000 mL | INTRAMUSCULAR | Status: DC | PRN

## 2014-06-24 MED ORDER — GLYCOPYRROLATE 0.2 MG/ML IJ SOLN
INTRAMUSCULAR | Status: DC | PRN
  Administered 2014-06-24: .6 mg via INTRAVENOUS

## 2014-06-24 MED ORDER — LIDOCAINE HCL (CARDIAC) 20 MG/ML IV SOLN
INTRAVENOUS | Status: DC | PRN
  Administered 2014-06-24: 50 mg via INTRAVENOUS

## 2014-06-24 MED ORDER — HYDROMORPHONE HCL 1 MG/ML IJ SOLN
0.2500 mg | INTRAMUSCULAR | Status: DC | PRN
  Administered 2014-06-24 (×3): 0.5 mg via INTRAVENOUS

## 2014-06-24 MED ORDER — VECURONIUM BROMIDE 10 MG IV SOLR
INTRAVENOUS | Status: DC | PRN
  Administered 2014-06-24: 5 mg via INTRAVENOUS

## 2014-06-24 MED ORDER — LACTATED RINGERS IV SOLN
INTRAVENOUS | Status: DC | PRN
  Administered 2014-06-24 (×2): via INTRAVENOUS

## 2014-06-24 MED ORDER — INSULIN ASPART 100 UNIT/ML ~~LOC~~ SOLN
5.0000 [IU] | Freq: Three times a day (TID) | SUBCUTANEOUS | Status: DC
  Administered 2014-06-25: 5 [IU] via SUBCUTANEOUS

## 2014-06-24 MED ORDER — PHENOL 1.4 % MT LIQD: 1.0000 | OROMUCOSAL | Status: DC | PRN

## 2014-06-24 MED ORDER — 0.9 % SODIUM CHLORIDE (POUR BTL) OPTIME
TOPICAL | Status: DC | PRN
  Administered 2014-06-24: 1000 mL

## 2014-06-24 MED ORDER — OXYCODONE HCL 5 MG PO TABS
5.0000 mg | ORAL_TABLET | Freq: Once | ORAL | Status: AC | PRN
  Administered 2014-06-24: 5 mg via ORAL

## 2014-06-24 MED ORDER — FENTANYL CITRATE 0.05 MG/ML IJ SOLN
INTRAMUSCULAR | Status: DC | PRN
  Administered 2014-06-24 (×2): 100 ug via INTRAVENOUS
  Administered 2014-06-24: 50 ug via INTRAVENOUS

## 2014-06-24 MED ORDER — FLUTICASONE PROPIONATE 50 MCG/ACT NA SUSP
1.0000 | Freq: Every day | NASAL | Status: DC
  Filled 2014-06-24: qty 16

## 2014-06-24 MED ORDER — SODIUM CHLORIDE 0.9 % IJ SOLN
3.0000 mL | Freq: Two times a day (BID) | INTRAMUSCULAR | Status: DC
  Administered 2014-06-24: 3 mL via INTRAVENOUS

## 2014-06-24 MED ORDER — NEOSTIGMINE METHYLSULFATE 10 MG/10ML IV SOLN
INTRAVENOUS | Status: DC | PRN
  Administered 2014-06-24: 3 mg via INTRAVENOUS

## 2014-06-24 MED ORDER — MORPHINE SULFATE 2 MG/ML IJ SOLN
1.0000 mg | INTRAMUSCULAR | Status: DC | PRN
  Administered 2014-06-24 – 2014-06-25 (×2): 2 mg via INTRAVENOUS
  Filled 2014-06-24 (×2): qty 1

## 2014-06-24 MED ORDER — CYCLOBENZAPRINE HCL 10 MG PO TABS: 10.0000 mg | ORAL_TABLET | Freq: Three times a day (TID) | ORAL | Status: DC | PRN

## 2014-06-24 MED ORDER — ASPIRIN EC 81 MG PO TBEC
81.0000 mg | DELAYED_RELEASE_TABLET | Freq: Every day | ORAL | Status: DC
  Administered 2014-06-25: 81 mg via ORAL
  Filled 2014-06-24 (×2): qty 1

## 2014-06-24 MED ORDER — HYDROMORPHONE HCL 1 MG/ML IJ SOLN
INTRAMUSCULAR | Status: AC
  Filled 2014-06-24: qty 1

## 2014-06-24 MED ORDER — LORATADINE 10 MG PO TABS
10.0000 mg | ORAL_TABLET | Freq: Every day | ORAL | Status: DC
  Administered 2014-06-25: 10 mg via ORAL
  Filled 2014-06-24: qty 1

## 2014-06-24 MED ORDER — HYDROCODONE-ACETAMINOPHEN 5-325 MG PO TABS: 1.0000 | ORAL_TABLET | ORAL | Status: DC | PRN

## 2014-06-24 MED ORDER — MIDAZOLAM HCL 5 MG/5ML IJ SOLN
INTRAMUSCULAR | Status: DC | PRN
  Administered 2014-06-24: 2 mg via INTRAVENOUS

## 2014-06-24 MED ORDER — HYDROCHLOROTHIAZIDE 25 MG PO TABS
25.0000 mg | ORAL_TABLET | Freq: Every day | ORAL | Status: DC
  Administered 2014-06-25: 25 mg via ORAL
  Filled 2014-06-24: qty 1

## 2014-06-24 MED ORDER — SENNA 8.6 MG PO TABS
1.0000 | ORAL_TABLET | Freq: Two times a day (BID) | ORAL | Status: DC
  Administered 2014-06-25: 8.6 mg via ORAL
  Filled 2014-06-24 (×2): qty 1

## 2014-06-24 MED ORDER — PROMETHAZINE HCL 25 MG/ML IJ SOLN
INTRAMUSCULAR | Status: AC
  Filled 2014-06-24: qty 1

## 2014-06-24 MED ORDER — MIDAZOLAM HCL 2 MG/2ML IJ SOLN
INTRAMUSCULAR | Status: AC
  Filled 2014-06-24: qty 2

## 2014-06-24 MED ORDER — FENTANYL CITRATE 0.05 MG/ML IJ SOLN
INTRAMUSCULAR | Status: AC
  Filled 2014-06-24: qty 5

## 2014-06-24 MED ORDER — POTASSIUM CHLORIDE IN NACL 20-0.9 MEQ/L-% IV SOLN
INTRAVENOUS | Status: DC
  Administered 2014-06-25: 01:00:00 via INTRAVENOUS
  Filled 2014-06-24 (×3): qty 1000

## 2014-06-24 MED ORDER — LACTATED RINGERS IV SOLN
INTRAVENOUS | Status: DC
  Administered 2014-06-24: 11:00:00 via INTRAVENOUS

## 2014-06-24 MED ORDER — OXYCODONE HCL 5 MG PO TABS
ORAL_TABLET | ORAL | Status: AC
  Filled 2014-06-24: qty 1

## 2014-06-24 MED ORDER — INSULIN GLARGINE 100 UNIT/ML ~~LOC~~ SOLN
20.0000 [IU] | Freq: Two times a day (BID) | SUBCUTANEOUS | Status: DC
  Administered 2014-06-24 – 2014-06-25 (×2): 20 [IU] via SUBCUTANEOUS
  Filled 2014-06-24 (×3): qty 0.2

## 2014-06-24 MED ORDER — LINAGLIPTIN 5 MG PO TABS
5.0000 mg | ORAL_TABLET | Freq: Every day | ORAL | Status: DC
  Administered 2014-06-25: 5 mg via ORAL
  Filled 2014-06-24 (×2): qty 1

## 2014-06-24 MED ORDER — ACETAMINOPHEN 650 MG RE SUPP: 650.0000 mg | RECTAL | Status: DC | PRN

## 2014-06-24 MED ORDER — ALENDRONATE SODIUM 70 MG PO TABS: 70.0000 mg | ORAL_TABLET | ORAL | Status: DC

## 2014-06-24 MED ORDER — GLIMEPIRIDE 4 MG PO TABS
4.0000 mg | ORAL_TABLET | Freq: Two times a day (BID) | ORAL | Status: DC
  Administered 2014-06-25: 4 mg via ORAL
  Filled 2014-06-24 (×3): qty 1

## 2014-06-24 MED ORDER — SODIUM CHLORIDE 0.9 % IV SOLN: 250.0000 mL | INTRAVENOUS | Status: DC

## 2014-06-24 MED ORDER — POLYETHYLENE GLYCOL 3350 17 G PO PACK
17.0000 g | PACK | Freq: Every day | ORAL | Status: DC | PRN
  Filled 2014-06-24: qty 1

## 2014-06-24 MED ORDER — OXYCODONE HCL 5 MG/5ML PO SOLN: 5.0000 mg | Freq: Once | ORAL | Status: AC | PRN

## 2014-06-24 MED ORDER — THROMBIN 5000 UNITS EX SOLR
CUTANEOUS | Status: DC | PRN
  Administered 2014-06-24 (×2): 5000 [IU] via TOPICAL

## 2014-06-24 MED ORDER — ONDANSETRON HCL 4 MG/2ML IJ SOLN
INTRAMUSCULAR | Status: DC | PRN
  Administered 2014-06-24: 4 mg via INTRAVENOUS

## 2014-06-24 MED ORDER — FAMOTIDINE 20 MG PO TABS
20.0000 mg | ORAL_TABLET | Freq: Two times a day (BID) | ORAL | Status: DC
  Administered 2014-06-25: 20 mg via ORAL
  Filled 2014-06-24 (×3): qty 1

## 2014-06-24 MED ORDER — PROPOFOL 10 MG/ML IV BOLUS
INTRAVENOUS | Status: AC
  Filled 2014-06-24: qty 20

## 2014-06-24 MED ORDER — KETOROLAC TROMETHAMINE 30 MG/ML IJ SOLN
30.0000 mg | Freq: Four times a day (QID) | INTRAMUSCULAR | Status: DC
  Administered 2014-06-25 (×3): 30 mg via INTRAVENOUS
  Filled 2014-06-24 (×6): qty 1

## 2014-06-24 MED ORDER — PANTOPRAZOLE SODIUM 40 MG PO TBEC
80.0000 mg | DELAYED_RELEASE_TABLET | Freq: Every day | ORAL | Status: DC
  Administered 2014-06-25: 80 mg via ORAL
  Filled 2014-06-24: qty 2

## 2014-06-24 MED ORDER — PROMETHAZINE HCL 25 MG/ML IJ SOLN
6.2500 mg | INTRAMUSCULAR | Status: DC | PRN
  Administered 2014-06-24: 6.25 mg via INTRAVENOUS

## 2014-06-24 MED ORDER — VITAMIN D (ERGOCALCIFEROL) 1.25 MG (50000 UNIT) PO CAPS: 50000.0000 [IU] | ORAL_CAPSULE | ORAL | Status: DC

## 2014-06-24 MED ORDER — ACETAMINOPHEN 325 MG PO TABS: 650.0000 mg | ORAL_TABLET | ORAL | Status: DC | PRN

## 2014-06-24 MED ORDER — ONDANSETRON HCL 4 MG/2ML IJ SOLN: 4.0000 mg | INTRAMUSCULAR | Status: DC | PRN

## 2014-06-24 SURGICAL SUPPLY — 51 items
BAG DECANTER FOR FLEXI CONT (MISCELLANEOUS) ×2 IMPLANT
BENZOIN TINCTURE PRP APPL 2/3 (GAUZE/BANDAGES/DRESSINGS) IMPLANT
BLADE CLIPPER SURG (BLADE) IMPLANT
BUR MATCHSTICK NEURO 3.0 LAGG (BURR) ×2 IMPLANT
CANISTER SUCT 3000ML (MISCELLANEOUS) ×2 IMPLANT
CONT SPEC 4OZ CLIKSEAL STRL BL (MISCELLANEOUS) ×2 IMPLANT
DECANTER SPIKE VIAL GLASS SM (MISCELLANEOUS) ×2 IMPLANT
DRAPE LAPAROTOMY 100X72X124 (DRAPES) ×2 IMPLANT
DRAPE MICROSCOPE LEICA (MISCELLANEOUS) ×2 IMPLANT
DRAPE POUCH INSTRU U-SHP 10X18 (DRAPES) ×2 IMPLANT
DRAPE SURG 17X23 STRL (DRAPES) ×2 IMPLANT
DURAPREP 26ML APPLICATOR (WOUND CARE) ×2 IMPLANT
ELECT REM PT RETURN 9FT ADLT (ELECTROSURGICAL) ×2
ELECTRODE REM PT RTRN 9FT ADLT (ELECTROSURGICAL) ×1 IMPLANT
GAUZE SPONGE 4X4 12PLY STRL (GAUZE/BANDAGES/DRESSINGS) IMPLANT
GAUZE SPONGE 4X4 16PLY XRAY LF (GAUZE/BANDAGES/DRESSINGS) IMPLANT
GLOVE BIO SURGEON STRL SZ 6.5 (GLOVE) ×4 IMPLANT
GLOVE BIOGEL PI IND STRL 6.5 (GLOVE) ×1 IMPLANT
GLOVE BIOGEL PI IND STRL 7.0 (GLOVE) ×2 IMPLANT
GLOVE BIOGEL PI INDICATOR 6.5 (GLOVE) ×1
GLOVE BIOGEL PI INDICATOR 7.0 (GLOVE) ×2
GLOVE ECLIPSE 6.5 STRL STRAW (GLOVE) ×6 IMPLANT
GLOVE EXAM NITRILE LRG STRL (GLOVE) IMPLANT
GLOVE EXAM NITRILE MD LF STRL (GLOVE) IMPLANT
GLOVE EXAM NITRILE XL STR (GLOVE) IMPLANT
GLOVE EXAM NITRILE XS STR PU (GLOVE) IMPLANT
GOWN STRL REUS W/ TWL LRG LVL3 (GOWN DISPOSABLE) ×3 IMPLANT
GOWN STRL REUS W/ TWL XL LVL3 (GOWN DISPOSABLE) IMPLANT
GOWN STRL REUS W/TWL 2XL LVL3 (GOWN DISPOSABLE) IMPLANT
GOWN STRL REUS W/TWL LRG LVL3 (GOWN DISPOSABLE) ×3
GOWN STRL REUS W/TWL XL LVL3 (GOWN DISPOSABLE)
KIT BASIN OR (CUSTOM PROCEDURE TRAY) ×2 IMPLANT
KIT ROOM TURNOVER OR (KITS) ×2 IMPLANT
LIQUID BAND (GAUZE/BANDAGES/DRESSINGS) ×2 IMPLANT
NEEDLE HYPO 25X1 1.5 SAFETY (NEEDLE) ×2 IMPLANT
NEEDLE SPNL 18GX3.5 QUINCKE PK (NEEDLE) ×2 IMPLANT
NS IRRIG 1000ML POUR BTL (IV SOLUTION) ×2 IMPLANT
PACK LAMINECTOMY NEURO (CUSTOM PROCEDURE TRAY) ×2 IMPLANT
PAD ARMBOARD 7.5X6 YLW CONV (MISCELLANEOUS) ×6 IMPLANT
RUBBERBAND STERILE (MISCELLANEOUS) ×4 IMPLANT
SPONGE LAP 4X18 X RAY DECT (DISPOSABLE) IMPLANT
SPONGE SURGIFOAM ABS GEL SZ50 (HEMOSTASIS) ×2 IMPLANT
STRIP CLOSURE SKIN 1/2X4 (GAUZE/BANDAGES/DRESSINGS) IMPLANT
SUT VIC AB 0 CT1 18XCR BRD8 (SUTURE) ×1 IMPLANT
SUT VIC AB 0 CT1 8-18 (SUTURE) ×1
SUT VIC AB 2-0 CT1 18 (SUTURE) ×2 IMPLANT
SUT VIC AB 3-0 SH 8-18 (SUTURE) ×4 IMPLANT
SYR 20ML ECCENTRIC (SYRINGE) ×2 IMPLANT
TOWEL OR 17X24 6PK STRL BLUE (TOWEL DISPOSABLE) ×2 IMPLANT
TOWEL OR 17X26 10 PK STRL BLUE (TOWEL DISPOSABLE) ×2 IMPLANT
WATER STERILE IRR 1000ML POUR (IV SOLUTION) ×2 IMPLANT

## 2014-06-24 NOTE — Op Note (Signed)
06/24/2014  4:42 PM  PATIENT:  Audrey Green  69 y.o. female with pain in the left lower extremity and a herniated disc at L4/5 eccentric to the left side. She failed conservative treatment and has opted for operative decompression.   PRE-OPERATIVE DIAGNOSIS:  lumbar herniated disc L4/5  POST-OPERATIVE DIAGNOSIS:  lumbar herniated disc L4/5  PROCEDURE:  Procedure(s): Left Lumbar four-five microdiskectomy  SURGEON:   Surgeon(s): Ashok Pall, MD  ASSISTANTS:none  ANESTHESIA:   none  EBL:  Total I/O In: 1300 [I.V.:1300] Out: -   BLOOD ADMINISTERED:none  CELL SAVER GIVEN:none  COUNT:per nursing  DRAINS: none   SPECIMEN:  No Specimen  DICTATION: Audrey Green was taken to the operating room, intubated and placed under a general anesthetic without difficulty. She was positioned prone on a Wilson frame with all pressure points padded. Her back was prepped and draped in a sterile manner. I opened the skin with a 10 blade and carried the dissection down to the thoracolumbar fascia. I used both sharp dissection and the monopolar cautery to expose the lamina of L5, and L5. I confirmed my location with an intraoperative xray.  I used the drill, Kerrison punches, and curettes to perform a semihemilaminectomy of L4. I used the punches to remove the ligamentum flavum to expose the thecal sac. I brought the microscope into the operative field. I started the decompression of the spinal canal, thecal sac and L5 root(s). I cauterized epidural veins overlying the disc space then divided them sharply. I opened the disc space with a 15 blade and proceeded with the discectomy. I used pituitary rongeurs, curettes, and other instruments to remove disc material. After the discectomy was completed I inspected the L5 nerve root and felt it was well decompressed. I explored rostrally, laterally, medially, and caudally and was satisfied with the decompression. I irrigated the wound, then closed in layers. I approximated  the thoracolumbar fascia, subcutaneous, and subcuticular planes with vicryl sutures. I used dermabond for a sterile dressing.  The disc was quite degenerated and there was a large central compenent which was removed.  PLAN OF CARE: Admit for overnight observation  PATIENT DISPOSITION:  PACU - hemodynamically stable.   Delay start of Pharmacological VTE agent (>24hrs) due to surgical blood loss or risk of bleeding:  yes

## 2014-06-24 NOTE — H&P (Signed)
  BP 161/68 mmHg  Pulse 81  Temp(Src) 97.9 F (36.6 C) (Oral)  Resp 20  Ht 5\' 3"  (1.6 m)  Wt 73.483 kg (162 lb)  BMI 28.70 kg/m2  SpO2 96%  HOPI:                                                   Audrey Green returned today. She presented about two weeks ago with pain the back and left lower extremity. This was exacerbated by the time she needed tend to her mother where she was doing a lot of lifting and transferring of her mother.       DATA:                                                  She returned today and the MRI shows a herniated disc eccentric to the left at L4-5. I do believe this is the proximal cause of the pain that she has in the left lower extremity and buttocks.     PHYSICAL EXAMINATION:                    On exam reflexes are 2+ at the knees and ankles. Positive straight leg raising on the left side. Normal muscle tone, bulk, and coordination. Symmetric facies. Symmetric facial movement. Hearing intact to voice. Uvula elevates in the midline. Shoulder shrug is normal. Pupils are equal, round, and reactive to light. Height is 5 feet 3 inches. Weight is 165 pounds. Blood pressure is 158/70. Pulse is 86. Respiratory rate is 14. Temperature is 97.2. Pain is 6/10.                   IMPRESSION/PLAN:                             [name] would like to proceed with surgical decompression. She says she really does not want to pursue injections. She does not want to take oral steroids. She wants what is the best possible and durable treatment, that is surgery. She does have the herniated disc. It is not subtle. I do think that she would be helped. Risks and benefits, bleeding, infection, no relief, need for further surgery, disc recurrence, and damage to the nerve roots were discussed. I gave her a detailed instruction sheet and also discussed other possible risks. She understands and would like to proceed

## 2014-06-24 NOTE — Anesthesia Postprocedure Evaluation (Signed)
  Anesthesia Post-op Note  Patient: Audrey Green  Procedure(s) Performed: Procedure(s) with comments: Left Lumbar four-five microdiskectomy (Left) - Left Lumbar four-five microdiskectomy  Patient Location: PACU  Anesthesia Type:General  Level of Consciousness: awake and alert   Airway and Oxygen Therapy: Patient Spontanous Breathing  Post-op Pain: none  Post-op Assessment: Post-op Vital signs reviewed  Post-op Vital Signs: Reviewed  Last Vitals:  Filed Vitals:   06/24/14 1732  BP: 119/47  Pulse: 55  Temp:   Resp: 12    Complications: No apparent anesthesia complications

## 2014-06-24 NOTE — Anesthesia Procedure Notes (Signed)
Procedure Name: Intubation Date/Time: 06/24/2014 3:54 PM Performed by: Eligha Bridegroom Pre-anesthesia Checklist: Patient identified, Timeout performed, Emergency Drugs available, Suction available and Patient being monitored Patient Re-evaluated:Patient Re-evaluated prior to inductionOxygen Delivery Method: Circle system utilized Preoxygenation: Pre-oxygenation with 100% oxygen Ventilation: Mask ventilation without difficulty and Oral airway inserted - appropriate to patient size Laryngoscope Size: Mac and 3 Grade View: Grade III Tube type: Oral Tube size: 7.5 mm Number of attempts: 2 Airway Equipment and Method: Stylet and Video-laryngoscopy Placement Confirmation: ETT inserted through vocal cords under direct vision,  breath sounds checked- equal and bilateral and positive ETCO2 Secured at: 21 cm Tube secured with: Tape Dental Injury: Teeth and Oropharynx as per pre-operative assessment  Difficulty Due To: Difficult Airway- due to reduced neck mobility, Difficult Airway- due to anterior larynx and Difficulty was unanticipated Future Recommendations: Recommend- induction with short-acting agent, and alternative techniques readily available

## 2014-06-24 NOTE — Transfer of Care (Signed)
Immediate Anesthesia Transfer of Care Note  Patient: Audrey Green  Procedure(s) Performed: Procedure(s) with comments: Left Lumbar four-five microdiskectomy (Left) - Left Lumbar four-five microdiskectomy  Patient Location: PACU  Anesthesia Type:General  Level of Consciousness: awake, alert  and oriented  Airway & Oxygen Therapy: Patient Spontanous Breathing and Patient connected to nasal cannula oxygen  Post-op Assessment: Report given to PACU RN and Post -op Vital signs reviewed and stable  Post vital signs: Reviewed and stable  Complications: No apparent anesthesia complications

## 2014-06-24 NOTE — Anesthesia Preprocedure Evaluation (Addendum)
Anesthesia Evaluation  Patient identified by MRN, date of birth, ID band Patient awake    Reviewed: Allergy & Precautions, NPO status , Patient's Chart, lab work & pertinent test results  Airway Mallampati: III  TM Distance: >3 FB Neck ROM: Full    Dental  (+) Teeth Intact   Pulmonary former smoker,          Cardiovascular hypertension, Pt. on medications + Peripheral Vascular Disease     Neuro/Psych negative neurological ROS  negative psych ROS   GI/Hepatic GERD-  ,(+) Hepatitis -, C  Endo/Other  diabetes, Type 2, Insulin Dependent  Renal/GU CRFRenal disease     Musculoskeletal negative musculoskeletal ROS (+)   Abdominal   Peds  Hematology negative hematology ROS (+)   Anesthesia Other Findings   Reproductive/Obstetrics                            Anesthesia Physical Anesthesia Plan  ASA: III  Anesthesia Plan: General   Post-op Pain Management:    Induction: Intravenous  Airway Management Planned: Oral ETT  Additional Equipment:   Intra-op Plan:   Post-operative Plan: Extubation in OR  Informed Consent: I have reviewed the patients History and Physical, chart, labs and discussed the procedure including the risks, benefits and alternatives for the proposed anesthesia with the patient or authorized representative who has indicated his/her understanding and acceptance.   Dental advisory given  Plan Discussed with: CRNA  Anesthesia Plan Comments:         Anesthesia Quick Evaluation

## 2014-06-25 DIAGNOSIS — M5126 Other intervertebral disc displacement, lumbar region: Secondary | ICD-10-CM | POA: Diagnosis not present

## 2014-06-25 LAB — HEMOGLOBIN A1C
Hgb A1c MFr Bld: 8.1 % — ABNORMAL HIGH (ref <5.7)
Mean Plasma Glucose: 186 mg/dL — ABNORMAL HIGH (ref <117)

## 2014-06-25 LAB — GLUCOSE, CAPILLARY
GLUCOSE-CAPILLARY: 60 mg/dL — AB (ref 70–99)
GLUCOSE-CAPILLARY: 73 mg/dL (ref 70–99)
Glucose-Capillary: 134 mg/dL — ABNORMAL HIGH (ref 70–99)
Glucose-Capillary: 161 mg/dL — ABNORMAL HIGH (ref 70–99)

## 2014-06-25 MED ORDER — CYCLOBENZAPRINE HCL 10 MG PO TABS: 10.0000 mg | ORAL_TABLET | Freq: Three times a day (TID) | ORAL | Status: DC | PRN

## 2014-06-25 MED ORDER — TRAMADOL HCL 50 MG PO TABS: 50.0000 mg | ORAL_TABLET | Freq: Four times a day (QID) | ORAL | Status: DC | PRN

## 2014-06-25 NOTE — Discharge Instructions (Signed)
Lumbar Discectomy Care After A discectomy involves removal of discmaterial (the cartilage-like structures located between the bones of the back). It is done to relieve pressure on nerve roots. It can be used as a treatment for a back problem. The time in surgery depends on the findings in surgery and what is necessary to correct the problems. HOME CARE INSTRUCTIONS   Check the cut (incision) made by the surgeon twice a day for signs of infection. Some signs of infection may include:   A foul smelling, greenish or yellowish discharge from the wound.   Increased pain.   Increased redness over the incision (operative) site.   The skin edges may separate.   Flu-like symptoms (problems).   A temperature above 101.5 F (38.6 C).   Change your bandages in about 24 to 36 hours following surgery or as directed.   You may shower tomorrow. Avoid bathtubs, swimming pools and hot tubs for three weeks or until your incision has healed completely. If you have stitches or staples, they may be removed 2 to 3 weeks after surgery, or as directed by your doctor. This may be done by your doctor or caregiver.   You may walk as much as you like. No need to exercise at this time. Limit lifting to ~10lbs.  Weight reduction may be beneficial if you are overweight.   Daily exercise is helpful to prevent the return of problems. Walking is permitted. You may use a treadmill without an incline. Cut down on activities and exercise if you have discomfort. You may also go up and down stairs as much as you can tolerate.   DO NOT lift anything heavier than 10 . Avoid bending or twisting at the waist. Always bend your knees when lifting.   Maintain strength and range of motion as instructed.   Do not drive for 2 to 3 weeks, or as directed by your doctors. You may be a passenger for 20 to 30 minute trips. Lying back in the passenger seat may be more comfortable for you. Always wear a seatbelt.   Limit your sitting in  a regular chair to 20 to 30 minutes at a time. There are no limitations for sitting in a recliner. You should lie down or walk in between sitting periods.   Only take over-the-counter or prescription medicines for pain, discomfort, or fever as directed by your caregiver.  SEEK MEDICAL CARE IF:   There is increased bleeding (more than a small spot) from the wound.   You notice redness, swelling, or increasing pain in the wound.   Pus is coming from wound.   You develop an unexplained oral temperature above 102 F (38.9 C) develops.   You notice a foul smell coming from the wound or dressing.   You have increasing pain in your wound.  SEEK IMMEDIATE MEDICAL CARE IF:   You develop a rash.   You have difficulty breathing.   You develop any allergic problems to medicines given.

## 2014-06-25 NOTE — Progress Notes (Signed)
Patient alert and oriented, mae's well, voiding adequate amount of urine, swallowing without difficulty, no c/o pain. Patient discharged home with family. Script and discharged instructions given to patient. Patient and family stated understanding of d/c instructions given and has an appointment with MD. Aisha Emmett Bracknell RN 

## 2014-06-25 NOTE — Discharge Summary (Signed)
Physician Discharge Summary  Patient ID: Audrey Green MRN: [id number] DOB/AGE: 12-02-1945 69 y.o.  Admit date: 06/24/2014 Discharge date: 06/25/2014  Admission Diagnoses:HNP Left L4/5  Discharge Diagnoses: same Active Problems:   HNP (herniated nucleus pulposus), lumbar   Discharged Condition: good  Hospital Course: Audrey Green was admitted and taken to the operating room for an uncomplicated left J1/9 laminectomy and discetomy. She is voiding, ambulating and tolerating a regular diet without difficulty. Her wound is cleAN dry and without signs of infection. She will be discharged to home today.   Treatments: surgery: Left Lumbar four-five microdiskectomy   Discharge Exam: Blood pressure 147/66, pulse 78, temperature 98.4 F (36.9 C), temperature source Oral, resp. rate 18, height 5\' 3"  (1.6 m), weight 73.483 kg (162 lb), SpO2 99 %. General appearance: alert, cooperative, appears stated age and no distress Neurologic: Alert and oriented X 3, normal strength and tone. Normal symmetric reflexes. Normal coordination and gait  Disposition: 01-Home or Self Care lumbar herniated disc    Medication List    TAKE these medications        alendronate 70 MG tablet  Commonly known as:  FOSAMAX  Take 70 mg by mouth every 7 (seven) days.     aspirin EC 81 MG tablet  Take 81 mg by mouth daily.     cetirizine 10 MG tablet  Commonly known as:  ZYRTEC  Take 10 mg by mouth daily.     cyclobenzaprine 10 MG tablet  Commonly known as:  FLEXERIL  Take 1 tablet (10 mg total) by mouth 3 (three) times daily as needed for muscle spasms.     esomeprazole 40 MG capsule  Commonly known as:  NEXIUM  TAKE 1 CAPSULE (40MG  TOTAL ) BY MOUTH 2 TIMES DAILY     fluticasone 50 MCG/ACT nasal spray  Commonly known as:  FLONASE  USE 2 SPRAYS IN EACH NOSTRIL DAILY     glimepiride 4 MG tablet  Commonly known as:  AMARYL  Take 1 tablet (4 mg total) by mouth 2 (two) times daily before a meal.     glucose  blood test strip  Commonly known as:  ONE TOUCH ULTRA TEST  Check blood sugar twice daily and as directed. Dx 250.4     hydrochlorothiazide 25 MG tablet  Commonly known as:  HYDRODIURIL  Take 25 mg by mouth daily.     insulin glargine 100 UNIT/ML injection  Commonly known as:  LANTUS SOLOSTAR  Inject 15 units twice a day.     insulin lispro 100 UNIT/ML injection  Commonly known as:  HUMALOG  Inject 7 Units into the skin 3 (three) times daily before meals.     Insulin Pen Needle 31G X 6 MM Misc  Commonly known as:  ULTICARE MINI PEN NEEDLES  Use to inject insulin daily     multivitamin with minerals Tabs tablet  Take 1 tablet by mouth daily. 50+     ONE TOUCH LANCETS Misc  Use to check blood sugar twice a day     ranitidine 300 MG capsule  Commonly known as:  ZANTAC  Take 1 capsule (300 mg total) by mouth 2 (two) times daily.     sitaGLIPtin 100 MG tablet  Commonly known as:  JANUVIA  Take 1 tablet (100 mg total) by mouth daily.     traMADol 50 MG tablet  Commonly known as:  ULTRAM  Take 1 tablet (50 mg total) by mouth every 6 (six) hours as needed.  Vitamin D (Ergocalciferol) 50000 UNITS Caps capsule  Commonly known as:  DRISDOL  Take 50,000 Units by mouth every 30 (thirty) days.           Follow-up Information    Follow up with Jonluke Cobbins L, MD In 3 weeks.   Specialty:  Neurosurgery   Why:  call office to make an appointment   Contact information:   [address] [place] [address] [phone]       Signed: Winfield Cunas 06/25/2014, 2:43 PM

## 2014-06-28 ENCOUNTER — Encounter (HOSPITAL_COMMUNITY): Payer: Self-pay | Admitting: Neurosurgery

## 2014-07-21 ENCOUNTER — Other Ambulatory Visit: Payer: Self-pay | Admitting: *Deleted

## 2014-07-21 DIAGNOSIS — R911 Solitary pulmonary nodule: Secondary | ICD-10-CM

## 2014-09-09 ENCOUNTER — Ambulatory Visit: Payer: Medicare Other | Admitting: Cardiothoracic Surgery

## 2014-09-09 ENCOUNTER — Other Ambulatory Visit: Payer: Medicare Other

## 2014-09-14 ENCOUNTER — Encounter: Payer: Self-pay | Admitting: Gastroenterology

## 2014-09-16 ENCOUNTER — Ambulatory Visit (INDEPENDENT_AMBULATORY_CARE_PROVIDER_SITE_OTHER): Payer: Medicare Other | Admitting: Cardiothoracic Surgery

## 2014-09-16 ENCOUNTER — Ambulatory Visit
Admission: RE | Admit: 2014-09-16 | Discharge: 2014-09-16 | Disposition: A | Discharge status: Home / Self Care | Payer: Medicare Other | Source: Ambulatory Visit | Attending: Cardiothoracic Surgery | Admitting: Cardiothoracic Surgery

## 2014-09-16 ENCOUNTER — Encounter: Payer: Self-pay | Admitting: Cardiothoracic Surgery

## 2014-09-16 VITALS — BP 151/81 | HR 100 | Resp 16 | Ht 63.0 in | Wt 160.0 lb

## 2014-09-16 DIAGNOSIS — R918 Other nonspecific abnormal finding of lung field: Secondary | ICD-10-CM

## 2014-09-16 DIAGNOSIS — R911 Solitary pulmonary nodule: Secondary | ICD-10-CM

## 2014-09-16 NOTE — Progress Notes (Signed)
[address]       [place] [address]             [phone]                    Zehava [place]ter [place] Medical Record #[id number] Date of Birth: Jun 11, 1946  Referring: Lucille Passy, MD Primary Care: Madelyn Brunner, MD  Chief Complaint:    Chief Complaint  Patient presents with  . Lung Lesion    MULTIPLE...6 month f/u with CT CHEST    History of Present Illness:       She has a left upper lobe and right lower lobe groundglass nodules and several other small scattered nodules in the past and followed by  Dr Arlyce Dice. Patient had a CT scan done 6 months ago and returns today for follow-up scan. Since she's had no pneumonia shortness of breath or hemoptysis. In January of this year she did have a lumbar laminectomy. She notes marked improvement in the pain in her left leg since her back surgery  Current Activity/ Functional Status:  Patient is independent with mobility/ambulation, transfers, ADL's, IADL's.  Zubrod Score: At the time of surgery this patient's most appropriate activity status/level should be described as: [x]  Normal activity, no symptoms []  Symptoms, fully ambulatory []  Symptoms, in bed less than or equal to 50% of the time []  Symptoms, in bed greater than 50% of the time but less than 100% []  Bedridden []  Moribund   Past Medical History  Diagnosis Date  . Allergy   . Diabetes mellitus   . GERD (gastroesophageal reflux disease)   . Hyperlipidemia   . Hypertension   . Pancreatitis   . Osteoporosis   . History of colon polyps   . Complication of anesthesia   . PONV (postoperative nausea and vomiting)     years ago  . Seasonal allergies   . Goiter   . History of blood transfusion   . Fibroids   . Hepatitis     from blood transfusion, was treated in 2014  . Chronic kidney disease     stage 3    Past Surgical History  Procedure Laterality Date  . Cholecystectomy    . Tonsillectomy    . Hand surgery    . Tonsillectomy      . Abdominal hysterectomy  1977  . Colonoscopy w/ polypectomy    . Carpal tunnel release Bilateral   . Lumbar laminectomy/decompression microdiscectomy Left 06/24/2014    Procedure: Left Lumbar four-five microdiskectomy;  Surgeon: Ashok Pall, MD;  Location: [facility];  Service: Neurosurgery;  Laterality: Left;  Left Lumbar four-five microdiskectomy   Herniated disk with left leg pain - symptoms much improved  Family History  Problem Relation Age of Onset  . Breast cancer Mother   . Colon cancer Neg Hx   . Diabetes Mother   . Heart disease Mother     History   Social History  . Marital Status: Single    Spouse Name: N/A    Number of Children: 0  . Years of Education: N/A   Occupational History  . Retired- Restaurant manager, fast food    Social History Main Topics  . Smoking status: Former Smoker    Quit date: 06/11/2008  . Smokeless tobacco: Never Used  . Alcohol Use: No  . Drug Use: No  . Sexually Active: Not on file    Social History Narrative    moved  here from [place] to take care of mother with Alzheimers, who passed away last week  previously worked as a Counsellor for the [employer] . She denies any work with exposure to asbestos or cotton dust , she was a smoker for more than 15 years but quit in 2013 .      1 caffeine drink daily     History  Smoking status  . Former Smoker -- 11 years  . Quit date: 06/11/2008  Smokeless tobacco  . Never Used    History  Alcohol Use No     Allergies  Allergen Reactions  . Codeine     REACTION: Hives, tongue swells  . Penicillins     REACTION: rash and itching  . Sulfa Drugs Cross Reactors     itiching    Current Outpatient Prescriptions  Medication Sig Dispense Refill  . alendronate (FOSAMAX) 70 MG tablet Take 70 mg by mouth every 7 (seven) days.     Marland Kitchen aspirin EC 81 MG tablet Take 81 mg by mouth daily.    . cetirizine (ZYRTEC) 10 MG tablet Take 10 mg by mouth daily.      .  cyclobenzaprine (FLEXERIL) 10 MG tablet Take 1 tablet (10 mg total) by mouth 3 (three) times daily as needed for muscle spasms. 60 tablet 0  . esomeprazole (NEXIUM) 40 MG capsule TAKE 1 CAPSULE (40MG  TOTAL ) BY MOUTH 2 TIMES DAILY 180 capsule 1  . fluticasone (FLONASE) 50 MCG/ACT nasal spray USE 2 SPRAYS IN EACH NOSTRIL DAILY 48 g 1  . glimepiride (AMARYL) 4 MG tablet Take 1 tablet (4 mg total) by mouth 2 (two) times daily before a meal. 180 tablet 1  . glucose blood (ONE TOUCH ULTRA TEST) test strip Check blood sugar twice daily and as directed. Dx 250.4 (Patient taking differently: Check blood sugar three times daily and as directed. Dx 250.4) 100 each 5  . hydrochlorothiazide (HYDRODIURIL) 25 MG tablet Take 25 mg by mouth daily.    . insulin glargine (LANTUS SOLOSTAR) 100 UNIT/ML injection Inject 15 units twice a day. (Patient taking differently: Inject 20 Units into the skin 2 (two) times daily. ) 15 pen 1  . insulin lispro (HUMALOG) 100 UNIT/ML injection Inject 7 Units into the skin 3 (three) times daily before meals.    . Insulin Pen Needle (ULTICARE MINI PEN NEEDLES) 31G X 6 MM MISC Use to inject insulin daily 200 each 1  . Multiple Vitamin (MULITIVITAMIN WITH MINERALS) TABS Take 1 tablet by mouth daily. 50+    . ONE TOUCH LANCETS MISC Use to check blood sugar twice a day    . ranitidine (ZANTAC) 300 MG capsule Take 1 capsule (300 mg total) by mouth 2 (two) times daily. 180 capsule 1  . sitaGLIPtin (JANUVIA) 100 MG tablet Take 1 tablet (100 mg total) by mouth daily. 90 tablet 1  . traMADol (ULTRAM) 50 MG tablet Take 1 tablet (50 mg total) by mouth every 6 (six) hours as needed. 70 tablet 0  . Vitamin D, Ergocalciferol, (DRISDOL) 50000 UNITS CAPS capsule Take 50,000 Units by mouth every 30 (thirty) days.      No current facility-administered medications for this visit.       Review of Systems:     Cardiac Review of Systems: Y or N  Chest Pain [   n ]  Resting SOB [n   ] Exertional SOB   [n  ]  Orthopnea Florencio.Farrier  ]   Pedal  Edema [ n  ]    Palpitations [ n ] Syncope  [n  ]   Presyncope [ n  ]  General Review of Systems: [Y] = yes [  ]=no Constitional: recent weight change [  ]; anorexia [  ]; fatigue [  ]; nausea [  ]; night sweats [  ]; fever [  ]; or chills [  ];                                                                                                                                          Dental: poor dentition[  ]; Last Dentist visit:   Eye : blurred vision [  ]; diplopia [   ]; vision changes [  ];  Amaurosis fugax[  ]; Resp: cough [  ];  wheezing[  ];  hemoptysis[  ]; shortness of breath[  ]; paroxysmal nocturnal dyspnea[  ]; dyspnea on exertion[  ]; or orthopnea[  ];  GI:  gallstones[  ], vomiting[  ];  dysphagia[  ]; melena[  ];  hematochezia [  ]; heartburn[  ];   Hx of  Colonoscopy[ y ]; GU: kidney stones [  ]; hematuria[  ];   dysuria [  ];  nocturia[  ];  history of     obstruction [  ]; urinary frequency [  ]             Skin: rash, swelling[  ];, hair loss[  ];  peripheral edema[  ];  or itching[  ]; Musculosketetal: myalgias[  ];  joint swelling[  ];  joint erythema[  ];  joint pain[  ];  back pain[ improved ];  Heme/Lymph: bruising[  ];  bleeding[  ];  anemia[  ];  Neuro: TIA[  ];  headaches[  ];  stroke[  ];  vertigo[  ];  seizures[  ];   paresthesias[  ];  difficulty walking[  ];  Psych:depression[  ]; anxiety[  ];  Endocrine: diabetes[  ];  thyroid dysfunction[  ];  Immunizations: Flu [ y ]; Pneumococcal[ n ];  Other:  Physical Exam: BP 151/81 mmHg  Pulse 100  Resp 16  Ht 5\' 3"  (1.6 m)  Wt 160 lb (72.576 kg)  BMI 28.35 kg/m2  SpO2 98%  General appearance: alert, cooperative and appears stated age Neurologic: intact Heart: regular rate and rhythm, S1, S2 normal, no murmur, click, rub or gallop and normal apical impulse Lungs: clear to auscultation bilaterally and normal percussion bilaterally Abdomen: soft, non-tender; bowel sounds normal; no  masses,  no organomegaly Extremities: extremities normal, atraumatic, no cyanosis or edema and Homans sign is negative, no sign of DVT Patient has no cervical or supraclavicular adenopathy She has no carotid bruits, has full and equal radial brachial and pedal pulses.  Diagnostic Studies & Laboratory data:     Recent Radiology Findings:  Ct Chest Wo Contrast  09/16/2014   CLINICAL DATA:  Former smoker. Follow-up indeterminate pulmonary nodule.  EXAM: CT CHEST WITHOUT CONTRAST  TECHNIQUE: Multidetector CT imaging of the chest was performed following the standard protocol without IV contrast.  COMPARISON:  CT 03/02/2014, 01/15/2013  FINDINGS: Mediastinum/Nodes: No axillary or supraclavicular lymphadenopathy. No mediastinal hilar lymphadenopathy. No pericardial fluid. Coronary artery calcifications are noted. Esophagus is normal.  Lungs/Pleura: The right lower lobe pulmonary nodule of concern measures 9 mm by 4 mm not changed from 10 mm x 5 mm. (Image 28, series 4). This lesion is probably best seen on the coronal imaging measuring 6 mm on image 74 compared to 6 mm on prior.  A ground-glass nodule in the left upper lobe measures 8 mm (image 12, series 4) unchanged from 8 mm on prior.  No new pulmonary nodules are present.  Upper abdomen: Limited view of the liver, kidneys, pancreas are unremarkable. Normal adrenal glands.  Musculoskeletal: No aggressive osseous lesion.  IMPRESSION: 1. Stable right lower lobe pulmonary nodule over 7 month interval. Recommend follow-up CT without contrast in 12 months. 2. Stable left upper lobe pulmonary nodule over greater than 3 years.   Electronically Signed   By: Suzy Bouchard M.D.   On: 09/16/2014 12:32   I have independently reviewed the above radiology studies  and reviewed the findings with the patient.   Ct Chest Wo Contrast  03/02/2014   CLINICAL DATA:  Fall pulmonary nodules  EXAM: CT CHEST WITHOUT CONTRAST  TECHNIQUE: Multidetector CT imaging of the chest was  performed following the standard protocol without IV contrast.  COMPARISON:  None.  FINDINGS: Left upper lobe pulmonary nodule measuring 9 mm is unchanged from prior this nodule is stable over 3 years.  The right lower lobe pulmonary nodule has change in morphology from a ground-glass nodule to a solid nodule measuring 10 mm about 5 mm and has an elongated morphology (image 34, series 4). Overall size lesion is not changed as previously measured 15 x 9 mm. No new pulmonary nodules present.  No axillary supraclavicular lymphadenopathy. No mediastinal hilar lymphadenopathy. No pericardial fluid. Adrenal glands are normal. Postcholecystectomy. No aggressive osseous lesion.  IMPRESSION: 1. Stable left upper lobe pulmonary nodule over 3 year distal benign etiology. 2. Change in morphology of the right lower lobe from ground-glass to solid nodule. Recommend follow-up CT without contrast in 12 months to reassess this changing but not enlarging nodule.   Electronically Signed   By: Suzy Bouchard M.D.   On: 03/02/2014 14:59   US Abdomen Limited Ruq  02/11/2014   CLINICAL DATA:  Hepatoma surveillance, on medication for hypertension  EXAM: US ABDOMEN LIMITED - RIGHT UPPER QUADRANT  COMPARISON:  Ultrasound of the right upper quadrant of 03/04/2013  FINDINGS: Gallbladder:  The gallbladder has previously been resected. As noted on the prior ultrasound from 2014, a small cystic structure remains in the gallbladder fossa of 1.3 x 0.9 x 1.1 cm and is unchanged, of questionable significance. No peristalsis is seen in this region.  Common bile duct:  Diameter: The common bile duct is within normal limits post cholecystectomy measuring 7.7 mm in diameter.  Liver:  The liver has a normal echogenic pattern. No focal hepatic abnormality is seen.  IMPRESSION: 1. No focal hepatic abnormality. 2. No change in a small cystic structure in the gallbladder fossa of questionable significance.   Electronically Signed   By: Ivar Drape M.D.    On: 02/11/2014 09:11  Ct Chest Low Dose Pilot W/o Cm  01/15/2013   *RADIOLOGY REPORT*  Clinical Data: Follow-up evaluation of previously noted lung lesions.  CT CHEST LOW DOSE PILOT WITHOUT CONTRAST  Technique: Multidetector CT imaging of the chest using the standard low-dose protocol without administration of intravenous contrast.  Comparison: Chest CT 11/07/2011.  Findings:  Mediastinum: Heart size is normal. There is no significant pericardial fluid, thickening or pericardial calcification. There is atherosclerosis of the thoracic aorta, the great vessels of the mediastinum and the coronary arteries, including calcified atherosclerotic plaque in the left main, left anterior descending and right coronary arteries. No pathologically enlarged mediastinal or hilar lymph nodes. Please note that accurate exclusion of hilar adenopathy is limited on noncontrast CT scans.  Aberrant right subclavian artery (normal anatomical variant) incidentally noted. Esophagus is unremarkable in appearance.  Lungs/Pleura: The previously noted 4 mm right upper lobe ground- glass attenuation nodule is unchanged (image 113 of series 102). In the medial aspect of the left upper lobe (image 60 of series 102) there is a 12 x 8 mm ill-defined ground-glass attenuation nodule which is unchanged.  Likewise, in the periphery of the right lower lobe (image 100 of series 102) is a 1.3 x 0.8 cm pleural- based ground-glass attenuation nodule which is also unchanged.  No definite new suspicious appearing pulmonary nodules or masses are otherwise identified.  No acute consolidative air space disease. No pleural effusions.  There is a background of mild centrilobular emphysema.  Upper Abdomen: Status post cholecystectomy.  Musculoskeletal: There are no aggressive appearing lytic or blastic lesions noted in the visualized portions of the skeleton.  IMPRESSION: 1.  No significant change in numerous ground-glass attenuation nodules scattered throughout the  lungs bilaterally, as above. At this point, these nodules have been stable in size and appearance compared to prior study from 12/12/2010.  An additional 1 year follow-up is recommended at this time.  This recommendation follows the consensus statement: Recommendations for the Management of Subsolid Pulmonary Nodules Detected at CT:  A Statement from the [place] as published in Radiology 2013; 266:304-317. 2.  Mild centrilobular emphysema. 3. Atherosclerosis, including left main and two-vessel coronary artery disease. Please note that although the presence of coronary artery calcium documents the presence of coronary artery disease, the severity of this disease and any potential stenosis cannot be assessed on this non-gated CT examination.  Assessment for potential risk factor modification, dietary therapy or pharmacologic therapy may be warranted, if clinically indicated. 4.  Aberrant right subclavian artery (normal anatomical variant) incidentally noted. 5.  Status post cholecystectomy.   Original Report Authenticated By: Vinnie Langton, M.D.   Recent Lab Findings: Lab Results  Component Value Date   WBC 9.6 06/17/2014   HGB 11.8* 06/17/2014   HCT 37.0 06/17/2014   PLT 370 06/17/2014   GLUCOSE 158* 06/17/2014   CHOL 206* 08/14/2012   TRIG 229.0* 08/14/2012   HDL 40.80 08/14/2012   LDLDIRECT 139.3 08/14/2012   ALT 18 06/17/2014   AST 22 06/17/2014   NA 137 06/17/2014   K 3.8 06/17/2014   CL 106 06/17/2014   CREATININE 1.92* 06/17/2014   BUN 28* 06/17/2014   CO2 23 06/17/2014   TSH 0.864 09/27/2011   INR 0.89 10/15/2011   HGBA1C 8.1* 06/24/2014      Assessment / Plan:      1 No significant change in numerous ground-glass attenuation nodules scattered throughout the lungs bilaterally,  Stable since 2012 2 Aberrant right subclavian artery (normal anatomical variant) incidentally noted. 3 Atherosclerosis, including left main and two-vessel  coronary artery disease by non  gated CT of the chest, asymptomatic. Discussed with patient and recommended immediate if becomes symptomatic 4 Stable right lower lobe pulmonary nodule over 7 month interval. Recommend follow-up CT without contrast in 12 months. 2. Stable left upper lobe pulmonary nodule over greater than 3 years.  We'll see the patient in 12  months with followup CT scan to evaluate stability of lung nodules noted to be present since at least 2012.  Grace Isaac MD      [address] [place] [address] Office [phone]   Beeper [phone]  09/16/2014 2:03 PM

## 2014-10-26 ENCOUNTER — Other Ambulatory Visit (HOSPITAL_COMMUNITY): Payer: Self-pay | Admitting: Nurse Practitioner

## 2014-10-26 DIAGNOSIS — B182 Chronic viral hepatitis C: Secondary | ICD-10-CM

## 2014-11-18 ENCOUNTER — Ambulatory Visit (HOSPITAL_COMMUNITY)
Admission: RE | Admit: 2014-11-18 | Discharge: 2014-11-18 | Disposition: A | Discharge status: Home / Self Care | Payer: Medicare Other | Source: Ambulatory Visit | Attending: Nurse Practitioner | Admitting: Nurse Practitioner

## 2014-11-18 DIAGNOSIS — K7291 Hepatic failure, unspecified with coma: Secondary | ICD-10-CM | POA: Diagnosis not present

## 2014-11-18 DIAGNOSIS — B182 Chronic viral hepatitis C: Secondary | ICD-10-CM | POA: Diagnosis not present

## 2014-11-18 DIAGNOSIS — Z9049 Acquired absence of other specified parts of digestive tract: Secondary | ICD-10-CM | POA: Insufficient documentation

## 2015-01-14 ENCOUNTER — Other Ambulatory Visit: Payer: Self-pay | Admitting: Otolaryngology

## 2015-01-14 ENCOUNTER — Ambulatory Visit
Admission: RE | Admit: 2015-01-14 | Discharge: 2015-01-14 | Disposition: A | Discharge status: Home / Self Care | Payer: Medicare Other | Source: Ambulatory Visit | Attending: Otolaryngology | Admitting: Otolaryngology

## 2015-01-14 DIAGNOSIS — E042 Nontoxic multinodular goiter: Secondary | ICD-10-CM

## 2015-02-18 ENCOUNTER — Other Ambulatory Visit (HOSPITAL_COMMUNITY): Payer: Self-pay | Admitting: Otolaryngology

## 2015-02-18 DIAGNOSIS — E042 Nontoxic multinodular goiter: Secondary | ICD-10-CM

## 2015-02-22 ENCOUNTER — Other Ambulatory Visit: Payer: Self-pay | Admitting: Radiology

## 2015-02-23 ENCOUNTER — Other Ambulatory Visit (HOSPITAL_COMMUNITY): Payer: Self-pay | Admitting: Otolaryngology

## 2015-02-23 ENCOUNTER — Ambulatory Visit (HOSPITAL_COMMUNITY)
Admission: RE | Admit: 2015-02-23 | Discharge: 2015-02-23 | Disposition: A | Discharge status: Home / Self Care | Payer: Medicare Other | Source: Ambulatory Visit | Attending: Otolaryngology | Admitting: Otolaryngology

## 2015-02-23 DIAGNOSIS — E042 Nontoxic multinodular goiter: Secondary | ICD-10-CM

## 2015-02-23 MED ORDER — LIDOCAINE HCL (PF) 1 % IJ SOLN
INTRAMUSCULAR | Status: AC
  Filled 2015-02-23: qty 10

## 2015-02-23 NOTE — Procedures (Signed)
Successful ultrasound guided needle aspirate biopsy performed of the  Left superior thyroid nodule and left inferior thyroid nodule  Patient tolerated procedure well  See full dictation  Gareth Eagle, PA-C

## 2015-09-02 ENCOUNTER — Other Ambulatory Visit: Payer: Self-pay | Admitting: Cardiothoracic Surgery

## 2015-09-02 DIAGNOSIS — R918 Other nonspecific abnormal finding of lung field: Secondary | ICD-10-CM

## 2015-09-22 ENCOUNTER — Encounter: Payer: Self-pay | Admitting: Cardiothoracic Surgery

## 2015-09-22 ENCOUNTER — Ambulatory Visit
Admission: RE | Admit: 2015-09-22 | Discharge: 2015-09-22 | Disposition: A | Discharge status: Home / Self Care | Payer: Medicare Other | Source: Ambulatory Visit | Attending: Cardiothoracic Surgery | Admitting: Cardiothoracic Surgery

## 2015-09-22 ENCOUNTER — Ambulatory Visit (INDEPENDENT_AMBULATORY_CARE_PROVIDER_SITE_OTHER): Payer: Medicare Other | Admitting: Cardiothoracic Surgery

## 2015-09-22 VITALS — BP 147/79 | HR 76 | Resp 20 | Ht 63.0 in | Wt 178.0 lb

## 2015-09-22 DIAGNOSIS — R918 Other nonspecific abnormal finding of lung field: Secondary | ICD-10-CM

## 2015-09-22 NOTE — Progress Notes (Signed)
[address]       [place] [address]             [phone]                    Tyshana Comer [facility]ants Medical Record [id number] Date of Birth: 1946/02/04  Referring: Lucille Passy, MD Primary Care: Madelyn Brunner, MD  Chief Complaint:    Chief Complaint  Patient presents with  . Lung Lesion    1 year f/u with Chest CT  . Follow-up    History of Present Illness:       She has a left upper lobe and right lower lobe groundglass nodules and several other small scattered nodules in the past and followed by  Dr Arlyce Dice. Patient had a CT scan done 12 months ago and returns today for follow-up scan. Since she's had no pneumonia shortness of breath or hemoptysis. She is currently a nonsmoker stopped in 2013 prior she smoked for 10-12 years. She has long-standing diabetes current hemoglobin A1c is 7.5, she does have significant hyperlipidemia with LDH 171,  Current Activity/ Functional Status:  Patient is independent with mobility/ambulation, transfers, ADL's, IADL's.  Zubrod Score: At the time of surgery this patient's most appropriate activity status/level should be described as: [x]  Normal activity, no symptoms []  Symptoms, fully ambulatory []  Symptoms, in bed less than or equal to 50% of the time []  Symptoms, in bed greater than 50% of the time but less than 100% []  Bedridden []  Moribund   Past Medical History  Diagnosis Date  . Allergy   . Diabetes mellitus   . GERD (gastroesophageal reflux disease)   . Hyperlipidemia   . Hypertension   . Pancreatitis   . Osteoporosis   . History of colon polyps   . Complication of anesthesia   . PONV (postoperative nausea and vomiting)     years ago  . Seasonal allergies   . Goiter   . History of blood transfusion   . Fibroids   . Hepatitis     from blood transfusion, was treated in 2014  . Chronic kidney disease     stage 3    Past Surgical History  Procedure Laterality Date  . Cholecystectomy      . Tonsillectomy    . Hand surgery    . Tonsillectomy    . Abdominal hysterectomy  1977  . Colonoscopy w/ polypectomy    . Carpal tunnel release Bilateral   . Lumbar laminectomy/decompression microdiscectomy Left 06/24/2014    Procedure: Left Lumbar four-five microdiskectomy;  Surgeon: Ashok Pall, MD;  Location: [facility];  Service: Neurosurgery;  Laterality: Left;  Left Lumbar four-five microdiskectomy   Herniated disk with left leg pain - symptoms much improved  Family History  Problem Relation Age of Onset  . Breast cancer Mother   . Colon cancer Neg Hx   . Diabetes Mother   . Heart disease Mother     History   Social History  . Marital Status: Single    Spouse Name: N/A    Number of Children: 0  . Years of Education: N/A   Occupational History  . Retired- Restaurant manager, fast food    Social History Main Topics  . Smoking status: Former Smoker    Quit date: 06/11/2008  . Smokeless tobacco: Never Used  . Alcohol Use: No  . Drug Use: No  . Sexually Active: Not on file  Social History Narrative    moved here from [place] to take care of mother with Alzheimers, who passed away last week  previously worked as a Counsellor for the [employer] . She denies any work with exposure to asbestos or cotton dust , she was a smoker for more than 15 years but quit in 2013 .      1 caffeine drink daily     History  Smoking status  . Former Smoker -- 11 years  . Quit date: 06/11/2008  Smokeless tobacco  . Never Used    History  Alcohol Use No     Allergies  Allergen Reactions  . Codeine Hives  . Penicillins Hives and Swelling    Tongue and throat swelling  Has patient had a PCN reaction causing immediate rash, facial/tongue/throat swelling, SOB or lightheadedness with hypotension: Yes Has patient had a PCN reaction causing severe rash involving mucus membranes or skin necrosis: Yes Has patient had a PCN reaction that required hospitalization  Yes Has patient had a PCN reaction occurring within the last 10 years: No If all of the above answers are "NO", then may proceed with Cephalosporin use.  . Sulfa Antibiotics Hives    Current Outpatient Prescriptions  Medication Sig Dispense Refill  . alendronate (FOSAMAX) 70 MG tablet Take 70 mg by mouth every 7 (seven) days. On Wednesdays    . aspirin EC 81 MG tablet Take 81 mg by mouth daily.    . cetirizine (ZYRTEC) 10 MG tablet Take 10 mg by mouth at bedtime.     . cyclobenzaprine (FLEXERIL) 10 MG tablet Take 1 tablet (10 mg total) by mouth 3 (three) times daily as needed for muscle spasms. 60 tablet 0  . diltiazem (CARDIZEM CD) 120 MG 24 hr capsule Take 120 mg by mouth daily.  5  . esomeprazole (NEXIUM) 40 MG capsule TAKE 1 CAPSULE (40MG  TOTAL ) BY MOUTH 2 TIMES DAILY (Patient taking differently: Take 40 mg by mouth 2 (two) times daily before a meal. ) 180 capsule 1  . fluticasone (FLONASE) 50 MCG/ACT nasal spray USE 2 SPRAYS IN EACH NOSTRIL DAILY (Patient taking differently: Place 2 sprays into both nostrils at bedtime. ) 48 g 1  . glimepiride (AMARYL) 4 MG tablet Take 1 tablet (4 mg total) by mouth 2 (two) times daily before a meal. (Patient taking differently: Take 4 mg by mouth daily with breakfast. ) 180 tablet 1  . glucose blood (ONE TOUCH ULTRA TEST) test strip Check blood sugar twice daily and as directed. Dx 250.4 (Patient taking differently: Check blood sugar three times daily and as directed. Dx 250.4) 100 each 5  . hydrochlorothiazide (HYDRODIURIL) 25 MG tablet Take 25 mg by mouth daily.    . insulin glargine (LANTUS SOLOSTAR) 100 UNIT/ML injection Inject 15 units twice a day. (Patient taking differently: Inject 46 Units into the skin daily. ) 15 pen 1  . insulin lispro (HUMALOG KWIKPEN) 100 UNIT/ML KiwkPen Inject 3-12 Units into the skin 3 (three) times daily as needed (CBG >150). Per sliding scale CBG 150-200 3 units, 201-250 6 units, 250-300 9 units, >300 12 units    . Insulin  Pen Needle (ULTICARE MINI PEN NEEDLES) 31G X 6 MM MISC Use to inject insulin daily 200 each 1  . Liraglutide (VICTOZA) 18 MG/3ML SOPN Inject 1.8 mg into the skin daily.    . Multiple Vitamin (MULITIVITAMIN WITH MINERALS) TABS Take 1 tablet by mouth daily. Centrum Silver    .  ONE TOUCH LANCETS MISC Use to check blood sugar twice a day    . ranitidine (ZANTAC) 300 MG capsule Take 1 capsule (300 mg total) by mouth 2 (two) times daily. 180 capsule 1  . Vitamin D, Ergocalciferol, (DRISDOL) 50000 UNITS CAPS capsule Take 50,000 Units by mouth every 30 (thirty) days. On the 1st Friday of every month     No current facility-administered medications for this visit.       Review of Systems:     Cardiac Review of Systems: Y or N  Chest Pain [   n ]  Resting SOB [n   ] Exertional SOB  [n  ]  Orthopnea Florencio.Farrier  ]   Pedal Edema [ n  ]    Palpitations [ n ] Syncope  [n  ]   Presyncope [ n  ]  General Review of Systems: [Y] = yes [  ]=no Constitional: recent weight change [  ]; anorexia [  ]; fatigue [  ]; nausea [  ]; night sweats [  ]; fever [  ]; or chills [  ];                                                                                                                                          Dental: poor dentition[  ]; Last Dentist visit:   Eye : blurred vision [  ]; diplopia [   ]; vision changes [  ];  Amaurosis fugax[  ]; Resp: cough [  ];  wheezing[  ];  hemoptysis[  ]; shortness of breath[  ]; paroxysmal nocturnal dyspnea[  ]; dyspnea on exertion[  ]; or orthopnea[  ];  GI:  gallstones[  ], vomiting[  ];  dysphagia[  ]; melena[  ];  hematochezia [  ]; heartburn[  ];   Hx of  Colonoscopy[ y ]; GU: kidney stones [  ]; hematuria[  ];   dysuria [  ];  nocturia[  ];  history of     obstruction [  ]; urinary frequency [  ]             Skin: rash, swelling[  ];, hair loss[  ];  peripheral edema[  ];  or itching[  ]; Musculosketetal: myalgias[  ];  joint swelling[  ];  joint erythema[  ];  joint pain[  ];   back pain[ improved ];  Heme/Lymph: bruising[  ];  bleeding[  ];  anemia[  ];  Neuro: TIA[  ];  headaches[  ];  stroke[  ];  vertigo[  ];  seizures[  ];   paresthesias[  ];  difficulty walking[  ];  Psych:depression[  ]; anxiety[  ];  Endocrine: diabetes[  ];  thyroid dysfunction[  ];  Immunizations: Flu [ y ]; Pneumococcal[ n ];  Other:  Physical Exam: BP 147/79 mmHg  Pulse 76  Resp 20  Ht  5\' 3"  (1.6 m)  Wt 178 lb (80.74 kg)  BMI 31.54 kg/m2  SpO2 98%  General appearance: alert, cooperative and appears stated age Neurologic: intact Heart: regular rate and rhythm, S1, S2 normal, no murmur, click, rub or gallop and normal apical impulse Lungs: clear to auscultation bilaterally and normal percussion bilaterally Abdomen: soft, non-tender; bowel sounds normal; no masses,  no organomegaly Extremities: extremities normal, atraumatic, no cyanosis or edema and Homans sign is negative, no sign of DVT Patient has no cervical or supraclavicular adenopathy, the thyroid is not palpably enlarged She has no carotid bruits, has full and equal radial brachial and pedal pulses.  Diagnostic Studies & Laboratory data:     Recent Radiology Findings:  Ct Chest Wo Contrast  09/22/2015  CLINICAL DATA:  70 year old female with history of pulmonary nodule. Followup study. EXAM: CT CHEST WITHOUT CONTRAST TECHNIQUE: Multidetector CT imaging of the chest was performed following the standard protocol without IV contrast. COMPARISON:  Chest CT 09/16/2014. FINDINGS: Mediastinum/Lymph Nodes: Heart size is normal. There is no significant pericardial fluid, thickening or pericardial calcification. There is atherosclerosis of the thoracic aorta, the great vessels of the mediastinum and the coronary arteries, including calcified atherosclerotic plaque in the left main, left anterior descending, left circumflex and right coronary arteries. Aberrant right subclavian artery (normal anatomical variant) incidentally noted. No  pathologically enlarged mediastinal or hilar lymph nodes. Please note that accurate exclusion of hilar adenopathy is limited on noncontrast CT scans. Esophagus is unremarkable in appearance. No axillary lymphadenopathy. Lungs/Pleura: The nodule in the periphery of the right lower lobe appears slightly larger than the prior study, but is predominantly ground-glass attenuation measuring up to 13 x 12 mm (image 70 of series 8) with only a small 3 mm solid internal component (image 70 of series 7). Previously noted ground-glass attenuation nodule in the medial aspect of the left upper lobe is also slightly larger than the prior examination measuring 11 x 7 mm on today's study, with no central solid component. No new suspicious appearing pulmonary nodules or masses are otherwise noted. No acute consolidative airspace disease. No pleural effusions. Mild diffuse bronchial wall thickening with mild centrilobular emphysema. Upper Abdomen: Status post cholecystectomy.  Atherosclerosis. Musculoskeletal/Soft Tissues: There are no aggressive appearing lytic or blastic lesions noted in the visualized portions of the skeleton. IMPRESSION: 1. Slow progressive enlargement of 2 subsolid lesions in the left upper lobe and periphery of the right lower lobe, as above. These lesions remains concerning for potential slow-growing neoplasm such as adenocarcinoma. At this time, continued imaging surveillance is recommended. Specifically, repeat noncontrast chest CT is recommended in 12 months. This recommendation follows the consensus statement: Guidelines for Management of Incidental Pulmonary Nodules Detected on CT Images:From the Fleischner Society 2017; published online before print (10.1148/radiol.IJ:2314499). 2. Mild diffuse bronchial wall thickening with mild centrilobular emphysema; imaging findings suggestive of underlying COPD. 3. Atherosclerosis, including left main and 3 vessel coronary artery disease. Please note that although  the presence of coronary artery calcium documents the presence of coronary artery disease, the severity of this disease and any potential stenosis cannot be assessed on this non-gated CT examination. Assessment for potential risk factor modification, dietary therapy or pharmacologic therapy may be warranted, if clinically indicated. Electronically Signed   By: Vinnie Langton M.D.   On: 09/22/2015 09:03   Ct Chest Wo Contrast  09/16/2014   CLINICAL DATA:  Former smoker. Follow-up indeterminate pulmonary nodule.  EXAM: CT CHEST WITHOUT CONTRAST  TECHNIQUE: Multidetector CT imaging of  the chest was performed following the standard protocol without IV contrast.  COMPARISON:  CT 03/02/2014, 01/15/2013  FINDINGS: Mediastinum/Nodes: No axillary or supraclavicular lymphadenopathy. No mediastinal hilar lymphadenopathy. No pericardial fluid. Coronary artery calcifications are noted. Esophagus is normal.  Lungs/Pleura: The right lower lobe pulmonary nodule of concern measures 9 mm by 4 mm not changed from 10 mm x 5 mm. (Image 28, series 4). This lesion is probably best seen on the coronal imaging measuring 6 mm on image 74 compared to 6 mm on prior.  A ground-glass nodule in the left upper lobe measures 8 mm (image 12, series 4) unchanged from 8 mm on prior.  No new pulmonary nodules are present.  Upper abdomen: Limited view of the liver, kidneys, pancreas are unremarkable. Normal adrenal glands.  Musculoskeletal: No aggressive osseous lesion.  IMPRESSION: 1. Stable right lower lobe pulmonary nodule over 7 month interval. Recommend follow-up CT without contrast in 12 months. 2. Stable left upper lobe pulmonary nodule over greater than 3 years.   Electronically Signed   By: Suzy Bouchard M.D.   On: 09/16/2014 12:32   I have independently reviewed the above radiology studies  and reviewed the findings with the patient.   Ct Chest Wo Contrast  03/02/2014   CLINICAL DATA:  Fall pulmonary nodules  EXAM: CT CHEST  WITHOUT CONTRAST  TECHNIQUE: Multidetector CT imaging of the chest was performed following the standard protocol without IV contrast.  COMPARISON:  None.  FINDINGS: Left upper lobe pulmonary nodule measuring 9 mm is unchanged from prior this nodule is stable over 3 years.  The right lower lobe pulmonary nodule has change in morphology from a ground-glass nodule to a solid nodule measuring 10 mm about 5 mm and has an elongated morphology (image 34, series 4). Overall size lesion is not changed as previously measured 15 x 9 mm. No new pulmonary nodules present.  No axillary supraclavicular lymphadenopathy. No mediastinal hilar lymphadenopathy. No pericardial fluid. Adrenal glands are normal. Postcholecystectomy. No aggressive osseous lesion.  IMPRESSION: 1. Stable left upper lobe pulmonary nodule over 3 year distal benign etiology. 2. Change in morphology of the right lower lobe from ground-glass to solid nodule. Recommend follow-up CT without contrast in 12 months to reassess this changing but not enlarging nodule.   Electronically Signed   By: Suzy Bouchard M.D.   On: 03/02/2014 14:59   US Abdomen Limited Ruq  02/11/2014   CLINICAL DATA:  Hepatoma surveillance, on medication for hypertension  EXAM: US ABDOMEN LIMITED - RIGHT UPPER QUADRANT  COMPARISON:  Ultrasound of the right upper quadrant of 03/04/2013  FINDINGS: Gallbladder:  The gallbladder has previously been resected. As noted on the prior ultrasound from 2014, a small cystic structure remains in the gallbladder fossa of 1.3 x 0.9 x 1.1 cm and is unchanged, of questionable significance. No peristalsis is seen in this region.  Common bile duct:  Diameter: The common bile duct is within normal limits post cholecystectomy measuring 7.7 mm in diameter.  Liver:  The liver has a normal echogenic pattern. No focal hepatic abnormality is seen.  IMPRESSION: 1. No focal hepatic abnormality. 2. No change in a small cystic structure in the gallbladder fossa of  questionable significance.   Electronically Signed   By: Ivar Drape M.D.   On: 02/11/2014 09:11  Ct Chest Low Dose Pilot W/o Cm  01/15/2013   *RADIOLOGY REPORT*  Clinical Data: Follow-up evaluation of previously noted lung lesions.  CT CHEST LOW DOSE PILOT WITHOUT CONTRAST  Technique: Multidetector CT imaging of the chest using the standard low-dose protocol without administration of intravenous contrast.  Comparison: Chest CT 11/07/2011.  Findings:  Mediastinum: Heart size is normal. There is no significant pericardial fluid, thickening or pericardial calcification. There is atherosclerosis of the thoracic aorta, the great vessels of the mediastinum and the coronary arteries, including calcified atherosclerotic plaque in the left main, left anterior descending and right coronary arteries. No pathologically enlarged mediastinal or hilar lymph nodes. Please note that accurate exclusion of hilar adenopathy is limited on noncontrast CT scans.  Aberrant right subclavian artery (normal anatomical variant) incidentally noted. Esophagus is unremarkable in appearance.  Lungs/Pleura: The previously noted 4 mm right upper lobe ground- glass attenuation nodule is unchanged (image 113 of series 102). In the medial aspect of the left upper lobe (image 60 of series 102) there is a 12 x 8 mm ill-defined ground-glass attenuation nodule which is unchanged.  Likewise, in the periphery of the right lower lobe (image 100 of series 102) is a 1.3 x 0.8 cm pleural- based ground-glass attenuation nodule which is also unchanged.  No definite new suspicious appearing pulmonary nodules or masses are otherwise identified.  No acute consolidative air space disease. No pleural effusions.  There is a background of mild centrilobular emphysema.  Upper Abdomen: Status post cholecystectomy.  Musculoskeletal: There are no aggressive appearing lytic or blastic lesions noted in the visualized portions of the skeleton.  IMPRESSION: 1.  No significant  change in numerous ground-glass attenuation nodules scattered throughout the lungs bilaterally, as above. At this point, these nodules have been stable in size and appearance compared to prior study from 12/12/2010.  An additional 1 year follow-up is recommended at this time.  This recommendation follows the consensus statement: Recommendations for the Management of Subsolid Pulmonary Nodules Detected at CT:  A Statement from the [place] as published in Radiology 2013; 266:304-317. 2.  Mild centrilobular emphysema. 3. Atherosclerosis, including left main and two-vessel coronary artery disease. Please note that although the presence of coronary artery calcium documents the presence of coronary artery disease, the severity of this disease and any potential stenosis cannot be assessed on this non-gated CT examination.  Assessment for potential risk factor modification, dietary therapy or pharmacologic therapy may be warranted, if clinically indicated. 4.  Aberrant right subclavian artery (normal anatomical variant) incidentally noted. 5.  Status post cholecystectomy.   Original Report Authenticated By: Vinnie Langton, M.D.   Recent Lab Findings: Lab Results  Component Value Date   WBC 9.6 06/17/2014   HGB 11.8* 06/17/2014   HCT 37.0 06/17/2014   PLT 370 06/17/2014   GLUCOSE 158* 06/17/2014   CHOL 206* 08/14/2012   TRIG 229.0* 08/14/2012   HDL 40.80 08/14/2012   LDLDIRECT 139.3 08/14/2012   ALT 18 06/17/2014   AST 22 06/17/2014   NA 137 06/17/2014   K 3.8 06/17/2014   CL 106 06/17/2014   CREATININE 1.92* 06/17/2014   BUN 28* 06/17/2014   CO2 23 06/17/2014   TSH 0.864 09/27/2011   INR 0.9 11/24/2012   HGBA1C 8.1* 06/24/2014      Assessment / Plan:      1  numerous ground-glass attenuation nodules scattered throughout the lungs bilaterally,  Followed since  2012, Slowly  progressive enlargement of 2 subsolid lesions in the left upper lobe and periphery of the right lower lobe,  Cannot rule out slow-growing adenocarcinoma in groundglass opacities. Recommendations for follow-up in 12 months per radiology reviewed and discussed with the patient,  we'll plan for 8 months. 2 Aberrant right subclavian artery (normal anatomical variant) incidentally noted. 3 Atherosclerosis, including left main and two-vessel coronary artery disease by non gated CT of the chest, asymptomatic. Discussed with patient and recommended immediate if becomes symptomatic 4 Stable right lower lobe pulmonary nodule over 7 month interval. Recommend follow-up CT without contrast in 12 months. 2. Stable left upper lobe pulmonary nodule over greater than 3 years. 5 Hyperlipidemia, LDL 140 -with the patient's history of diabetes, calcification of coronary arteries but without symptoms, and persistent hyperlipidemia she will contact primary care and endocrinology to discuss advantages and disadvantages of statin therapy in her situation.   We'll see the patient in 8  months with followup CT scan to evaluate stability of lung nodules noted to be present since at least 2012.  Grace Isaac MD      [address] [place] [address] Office [phone]   Beeper [phone]  09/22/2015 10:02 AM

## 2016-05-01 ENCOUNTER — Other Ambulatory Visit: Payer: Self-pay | Admitting: Cardiothoracic Surgery

## 2016-05-01 DIAGNOSIS — R918 Other nonspecific abnormal finding of lung field: Secondary | ICD-10-CM

## 2016-06-14 ENCOUNTER — Ambulatory Visit
Admission: RE | Admit: 2016-06-14 | Discharge: 2016-06-14 | Disposition: A | Discharge status: Home / Self Care | Payer: Medicare Other | Source: Ambulatory Visit | Attending: Cardiothoracic Surgery | Admitting: Cardiothoracic Surgery

## 2016-06-14 ENCOUNTER — Encounter: Payer: Self-pay | Admitting: Cardiothoracic Surgery

## 2016-06-14 ENCOUNTER — Ambulatory Visit (INDEPENDENT_AMBULATORY_CARE_PROVIDER_SITE_OTHER): Payer: Medicare Other | Admitting: Cardiothoracic Surgery

## 2016-06-14 VITALS — BP 150/84 | HR 78 | Resp 20 | Ht 63.0 in | Wt 158.0 lb

## 2016-06-14 DIAGNOSIS — R918 Other nonspecific abnormal finding of lung field: Secondary | ICD-10-CM

## 2016-06-14 NOTE — Progress Notes (Signed)
[address]       [place] [address]             [phone]                    Onita Davalos [place] Medical Record #[id number] Date of Birth: 1945/09/05  Referring: Lucille Passy, MD  Primary Care: Madelyn Brunner, MD  Chief Complaint:    Chief Complaint  Patient presents with  . Lung Lesion    8 month f/u on lung nodules, CT chest done 06/14/16    History of Present Illness:      She has a left upper lobe and right lower lobe groundglass nodules and several other small scattered nodules in the past and followed by  Dr Arlyce Dice. Patient had a CT scan done 12 months ago and returns today for follow-up scan. Since she's had no pneumonia shortness of breath or hemoptysis. She is currently a nonsmoker,  stopped in 2013 prior she smoked for 10-12 years.   She has long-standing diabetes current hemoglobin A1c is 7.5, she does have significant hyperlipidemia with LDH 171,  Current Activity/ Functional Status:  Patient is independent with mobility/ambulation, transfers, ADL's, IADL's.  Zubrod Score: At the time of surgery this patient's most appropriate activity status/level should be described as: [x]  Normal activity, no symptoms []  Symptoms, fully ambulatory []  Symptoms, in bed less than or equal to 50% of the time []  Symptoms, in bed greater than 50% of the time but less than 100% []  Bedridden []  Moribund   Past Medical History:  Diagnosis Date  . Allergy   . Chronic kidney disease    stage 3  . Complication of anesthesia   . Diabetes mellitus   . Fibroids   . GERD (gastroesophageal reflux disease)   . Goiter   . Hepatitis    from blood transfusion, was treated in 2014  . History of blood transfusion   . History of colon polyps   . Hyperlipidemia   . Hypertension   . Osteoporosis   . Pancreatitis   . PONV (postoperative nausea and vomiting)    years ago  . Seasonal allergies     Past Surgical History:  Procedure Laterality Date  .  ABDOMINAL HYSTERECTOMY  1977  . CARPAL TUNNEL RELEASE Bilateral   . CHOLECYSTECTOMY    . COLONOSCOPY W/ POLYPECTOMY    . HAND SURGERY    . LUMBAR LAMINECTOMY/DECOMPRESSION MICRODISCECTOMY Left 06/24/2014   Procedure: Left Lumbar four-five microdiskectomy;  Surgeon: Ashok Pall, MD;  Location: [facility];  Service: Neurosurgery;  Laterality: Left;  Left Lumbar four-five microdiskectomy  . TONSILLECTOMY    . TONSILLECTOMY     Herniated disk with left leg pain - symptoms much improved  Family History  Problem Relation Age of Onset  . Breast cancer Mother   . Colon cancer Neg Hx   . Diabetes Mother   . Heart disease Mother     History   Social History  . Marital Status: Single    Spouse Name: N/A    Number of Children: 0  . Years of Education: N/A   Occupational History  . Retired- Restaurant manager, fast food    Social History Main Topics  . Smoking status: Former Smoker    Quit date: 06/11/2008  . Smokeless tobacco: Never Used  . Alcohol Use: No  . Drug Use: No  . Sexually Active: Not on file  Social History Narrative    moved here from [place] to take care of mother with Alzheimers, who passed away last week  previously worked as a Counsellor for the [employer] . She denies any work with exposure to asbestos or cotton dust , she was a smoker for more than 15 years but quit in 2013 .      1 caffeine drink daily     History  Smoking Status  . Former Smoker  . Years: 11.00  . Quit date: 06/11/2008  Smokeless Tobacco  . Never Used    History  Alcohol Use No     Allergies  Allergen Reactions  . Atorvastatin     Other reaction(s): Muscle Pain  . Codeine Hives  . Penicillins Hives and Swelling    Tongue and throat swelling  Has patient had a PCN reaction causing immediate rash, facial/tongue/throat swelling, SOB or lightheadedness with hypotension: Yes Has patient had a PCN reaction causing severe rash involving mucus membranes or skin  necrosis: Yes Has patient had a PCN reaction that required hospitalization Yes Has patient had a PCN reaction occurring within the last 10 years: No If all of the above answers are "NO", then may proceed with Cephalosporin use.  . Sulfa Antibiotics Hives    Current Outpatient Prescriptions  Medication Sig Dispense Refill  . alendronate (FOSAMAX) 70 MG tablet Take 70 mg by mouth every 7 (seven) days. On Wednesdays    . aspirin EC 81 MG tablet Take 81 mg by mouth daily.    . cetirizine (ZYRTEC) 10 MG tablet Take 10 mg by mouth at bedtime.     Marland Kitchen diltiazem (CARDIZEM CD) 120 MG 24 hr capsule Take 120 mg by mouth daily.  5  . esomeprazole (NEXIUM) 40 MG capsule TAKE 1 CAPSULE (40MG TOTAL ) BY MOUTH 2 TIMES DAILY (Patient taking differently: Take 40 mg by mouth 2 (two) times daily before a meal. ) 180 capsule 1  . fluticasone (FLONASE) 50 MCG/ACT nasal spray USE 2 SPRAYS IN EACH NOSTRIL DAILY (Patient taking differently: Place 2 sprays into both nostrils at bedtime. ) 48 g 1  . glimepiride (AMARYL) 4 MG tablet Take 1 tablet (4 mg total) by mouth 2 (two) times daily before a meal. (Patient taking differently: Take 4 mg by mouth daily with breakfast. ) 180 tablet 1  . glucose blood (ONE TOUCH ULTRA TEST) test strip Check blood sugar twice daily and as directed. Dx 250.4 (Patient taking differently: Check blood sugar three times daily and as directed. Dx 250.4) 100 each 5  . hydrochlorothiazide (HYDRODIURIL) 25 MG tablet Take 25 mg by mouth daily.    . insulin glargine (LANTUS SOLOSTAR) 100 UNIT/ML injection Inject 15 units twice a day. (Patient taking differently: Inject 46 Units into the skin daily. ) 15 pen 1  . insulin lispro (HUMALOG KWIKPEN) 100 UNIT/ML KiwkPen Inject 3-12 Units into the skin 3 (three) times daily as needed (CBG >150). Per sliding scale CBG 150-200 3 units, 201-250 6 units, 250-300 9 units, >300 12 units    . Insulin Pen Needle (ULTICARE MINI PEN NEEDLES) 31G X 6 MM MISC Use to  inject insulin daily 200 each 1  . Liraglutide (VICTOZA) 18 MG/3ML SOPN Inject 1.8 mg into the skin daily.    . Multiple Vitamin (MULITIVITAMIN WITH MINERALS) TABS Take 1 tablet by mouth daily. Centrum Silver    . ONE TOUCH LANCETS MISC Use to check blood sugar twice a day    .  ranitidine (ZANTAC) 300 MG capsule Take 1 capsule (300 mg total) by mouth 2 (two) times daily. 180 capsule 1   No current facility-administered medications for this visit.        Review of Systems:     Cardiac Review of Systems: Y or N  Chest Pain [   n ]  Resting SOB [n   ] Exertional SOB  [n  ]  Orthopnea Florencio.Farrier  ]   Pedal Edema [ n  ]    Palpitations [ n ] Syncope  [n  ]   Presyncope [ n  ]  General Review of Systems: [Y] = yes [  ]=no Constitional: recent weight change [  ]; anorexia [  ]; fatigue [  ]; nausea [  ]; night sweats [  ]; fever [  ]; or chills [  ];                                                                                                                                          Dental: poor dentition[  ]; Last Dentist visit:   Eye : blurred vision [  ]; diplopia [   ]; vision changes [  ];  Amaurosis fugax[  ]; Resp: cough [  ];  wheezing[  ];  hemoptysis[  ]; shortness of breath[  ]; paroxysmal nocturnal dyspnea[  ]; dyspnea on exertion[  ]; or orthopnea[  ];  GI:  gallstones[  ], vomiting[  ];  dysphagia[  ]; melena[  ];  hematochezia [  ]; heartburn[  ];   Hx of  Colonoscopy[ y ]; GU: kidney stones [  ]; hematuria[  ];   dysuria [  ];  nocturia[  ];  history of     obstruction [  ]; urinary frequency [  ]             Skin: rash, swelling[  ];, hair loss[  ];  peripheral edema[  ];  or itching[  ]; Musculosketetal: myalgias[  ];  joint swelling[  ];  joint erythema[  ];  joint pain[  ];  back pain[ improved ];  Heme/Lymph: bruising[  ];  bleeding[  ];  anemia[  ];  Neuro: TIA[  ];  headaches[  ];  stroke[  ];  vertigo[  ];  seizures[  ];   paresthesias[  ];  difficulty walking[   ];  Psych:depression[  ]; anxiety[  ];  Endocrine: diabetes[  ];  thyroid dysfunction[  ];  Immunizations: Flu [ y ]; Pneumococcal[ n ];  Other:  Physical Exam: BP (!) 150/84   Pulse 78   Resp 20   Ht 5' 3"  (1.6 m)   Wt 158 lb (71.7 kg)   SpO2 98% Comment: on ra  BMI 27.99 kg/m   General appearance: alert, cooperative and appears stated age Neurologic: intact Heart: regular rate and rhythm, S1,  S2 normal, no murmur, click, rub or gallop and normal apical impulse Lungs: clear to auscultation bilaterally and normal percussion bilaterally Abdomen: soft, non-tender; bowel sounds normal; no masses,  no organomegaly Extremities: extremities normal, atraumatic, no cyanosis or edema and Homans sign is negative, no sign of DVT Patient has no cervical or supraclavicular adenopathy, the thyroid is not palpably enlarged She has no carotid bruits, has full and equal radial brachial and pedal pulses.  Diagnostic Studies & Laboratory data:     Recent Radiology Findings:   Ct Chest Wo Contrast  Result Date: 06/14/2016 CLINICAL DATA:  71 year old female with history of pulmonary nodules. Annual follow-up. EXAM: CT CHEST WITHOUT CONTRAST TECHNIQUE: Multidetector CT imaging of the chest was performed following the standard protocol without IV contrast. COMPARISON:  Chest CT 09/22/2015 and several other are more remote prior examinations. FINDINGS: Note: On today's study, the patient appears to have been propped upward on the CT scanner table, resulting in slightly different sampling of the previously noted pulmonary nodules on any given axial image. Cardiovascular: Heart size is normal. There is no significant pericardial fluid, thickening or pericardial calcification. There is aortic atherosclerosis, as well as atherosclerosis of the great vessels of the mediastinum and the coronary arteries, including calcified atherosclerotic plaque in the left main, left anterior descending, left circumflex and right  coronary arteries. Aberrant right subclavian artery (normal anatomical variant) incidentally noted. Mediastinum/Nodes: No pathologically enlarged mediastinal or hilar lymph nodes. Please note that accurate exclusion of hilar adenopathy is limited on noncontrast CT scans. Esophagus is unremarkable in appearance. No axillary lymphadenopathy. Lungs/Pleura: Previously noted ground-glass attenuation nodule in the medial aspect of the left upper lobe is stable in size measuring 11 x 8 mm (measurements were made on images 22 and 23 of series 4 to directly compare with the prior study and compensate for slight difference in sampling of this lesion on axial images). The other previously noted mixed ground-glass attenuation and solid lesion in the periphery of the right lower lobe demonstrates a smaller ground-glass attenuation component measuring only 9 x 14 mm (axial image 59 of series 4) on today's study (previously 12 x 13 mm), but an enlarging soft tissue attenuation component measuring 6 mm in greatest length (axial image 60 of series 3). No other new suspicious appearing pulmonary nodules or masses are noted. There is no acute consolidative airspace disease, and no pleural effusions. Upper Abdomen: Aortic atherosclerosis.  Status post cholecystectomy. Musculoskeletal: There are no aggressive appearing lytic or blastic lesions noted in the visualized portions of the skeleton. IMPRESSION: 1. Previously noted ground-glass attenuation nodule in the left upper lobe is stable. Other previously noted mixed solid and sub solid lesion in the periphery of the right lower lobe has changed slightly, with the decreasing ground-glass attenuation component and increasing solid component which currently measures up to 6 mm. Although the interval decrease in size of the ground-glass attenuation component is unusual, the enlarging solid component to this lesion is concerning for potential developing invasive adenocarcinoma, particularly  in light of the nodule's behavior over the past several years. Given the small size of this nodule, the PET-CT may be of limited value. At the very least, close attention on follow-up chest CT is recommended. Alternatively, consideration for biopsy or wedge resection may be warranted if clinically appropriate. 2. Aortic atherosclerosis, in addition to left main and 3 vessel coronary artery disease. Please note that although the presence of coronary artery calcium documents the presence of coronary artery disease, the severity  of this disease and any potential stenosis cannot be assessed on this non-gated CT examination. Assessment for potential risk factor modification, dietary therapy or pharmacologic therapy may be warranted, if clinically indicated. These results will be called to the ordering clinician or representative by the Radiologist Assistant, and communication documented in the PACS or zVision Dashboard. Electronically Signed   By: Vinnie Langton M.D.   On: 06/14/2016 12:53   I have independently reviewed the above radiology studies  and reviewed the findings with the patient.    Ct Chest Wo Contrast  09/22/2015  CLINICAL DATA:  71 year old female with history of pulmonary nodule. Followup study. EXAM: CT CHEST WITHOUT CONTRAST TECHNIQUE: Multidetector CT imaging of the chest was performed following the standard protocol without IV contrast. COMPARISON:  Chest CT 09/16/2014. FINDINGS: Mediastinum/Lymph Nodes: Heart size is normal. There is no significant pericardial fluid, thickening or pericardial calcification. There is atherosclerosis of the thoracic aorta, the great vessels of the mediastinum and the coronary arteries, including calcified atherosclerotic plaque in the left main, left anterior descending, left circumflex and right coronary arteries. Aberrant right subclavian artery (normal anatomical variant) incidentally noted. No pathologically enlarged mediastinal or hilar lymph nodes.  Please note that accurate exclusion of hilar adenopathy is limited on noncontrast CT scans. Esophagus is unremarkable in appearance. No axillary lymphadenopathy. Lungs/Pleura: The nodule in the periphery of the right lower lobe appears slightly larger than the prior study, but is predominantly ground-glass attenuation measuring up to 13 x 12 mm (image 70 of series 8) with only a small 3 mm solid internal component (image 70 of series 7). Previously noted ground-glass attenuation nodule in the medial aspect of the left upper lobe is also slightly larger than the prior examination measuring 11 x 7 mm on today's study, with no central solid component. No new suspicious appearing pulmonary nodules or masses are otherwise noted. No acute consolidative airspace disease. No pleural effusions. Mild diffuse bronchial wall thickening with mild centrilobular emphysema. Upper Abdomen: Status post cholecystectomy.  Atherosclerosis. Musculoskeletal/Soft Tissues: There are no aggressive appearing lytic or blastic lesions noted in the visualized portions of the skeleton. IMPRESSION: 1. Slow progressive enlargement of 2 subsolid lesions in the left upper lobe and periphery of the right lower lobe, as above. These lesions remains concerning for potential slow-growing neoplasm such as adenocarcinoma. At this time, continued imaging surveillance is recommended. Specifically, repeat noncontrast chest CT is recommended in 12 months. This recommendation follows the consensus statement: Guidelines for Management of Incidental Pulmonary Nodules Detected on CT Images:From the Fleischner Society 2017; published online before print (10.1148/radiol.[id number]). 2. Mild diffuse bronchial wall thickening with mild centrilobular emphysema; imaging findings suggestive of underlying COPD. 3. Atherosclerosis, including left main and 3 vessel coronary artery disease. Please note that although the presence of coronary artery calcium documents the  presence of coronary artery disease, the severity of this disease and any potential stenosis cannot be assessed on this non-gated CT examination. Assessment for potential risk factor modification, dietary therapy or pharmacologic therapy may be warranted, if clinically indicated. Electronically Signed   By: Vinnie Langton M.D.   On: 09/22/2015 09:03   Ct Chest Wo Contrast  09/16/2014   CLINICAL DATA:  Former smoker. Follow-up indeterminate pulmonary nodule.  EXAM: CT CHEST WITHOUT CONTRAST  TECHNIQUE: Multidetector CT imaging of the chest was performed following the standard protocol without IV contrast.  COMPARISON:  CT 03/02/2014, 01/15/2013  FINDINGS: Mediastinum/Nodes: No axillary or supraclavicular lymphadenopathy. No mediastinal hilar lymphadenopathy. No pericardial fluid. Coronary  artery calcifications are noted. Esophagus is normal.  Lungs/Pleura: The right lower lobe pulmonary nodule of concern measures 9 mm by 4 mm not changed from 10 mm x 5 mm. (Image 28, series 4). This lesion is probably best seen on the coronal imaging measuring 6 mm on image 74 compared to 6 mm on prior.  A ground-glass nodule in the left upper lobe measures 8 mm (image 12, series 4) unchanged from 8 mm on prior.  No new pulmonary nodules are present.  Upper abdomen: Limited view of the liver, kidneys, pancreas are unremarkable. Normal adrenal glands.  Musculoskeletal: No aggressive osseous lesion.  IMPRESSION: 1. Stable right lower lobe pulmonary nodule over 7 month interval. Recommend follow-up CT without contrast in 12 months. 2. Stable left upper lobe pulmonary nodule over greater than 3 years.   Electronically Signed   By: Suzy Bouchard M.D.   On: 09/16/2014 12:32   I have independently reviewed the above radiology studies  and reviewed the findings with the patient.   Ct Chest Wo Contrast  03/02/2014   CLINICAL DATA:  Fall pulmonary nodules  EXAM: CT CHEST WITHOUT CONTRAST  TECHNIQUE: Multidetector CT imaging of the  chest was performed following the standard protocol without IV contrast.  COMPARISON:  None.  FINDINGS: Left upper lobe pulmonary nodule measuring 9 mm is unchanged from prior this nodule is stable over 3 years.  The right lower lobe pulmonary nodule has change in morphology from a ground-glass nodule to a solid nodule measuring 10 mm about 5 mm and has an elongated morphology (image 34, series 4). Overall size lesion is not changed as previously measured 15 x 9 mm. No new pulmonary nodules present.  No axillary supraclavicular lymphadenopathy. No mediastinal hilar lymphadenopathy. No pericardial fluid. Adrenal glands are normal. Postcholecystectomy. No aggressive osseous lesion.  IMPRESSION: 1. Stable left upper lobe pulmonary nodule over 3 year distal benign etiology. 2. Change in morphology of the right lower lobe from ground-glass to solid nodule. Recommend follow-up CT without contrast in 12 months to reassess this changing but not enlarging nodule.   Electronically Signed   By: Suzy Bouchard M.D.   On: 03/02/2014 14:59   US Abdomen Limited Ruq  02/11/2014   CLINICAL DATA:  Hepatoma surveillance, on medication for hypertension  EXAM: US ABDOMEN LIMITED - RIGHT UPPER QUADRANT  COMPARISON:  Ultrasound of the right upper quadrant of 03/04/2013  FINDINGS: Gallbladder:  The gallbladder has previously been resected. As noted on the prior ultrasound from 2014, a small cystic structure remains in the gallbladder fossa of 1.3 x 0.9 x 1.1 cm and is unchanged, of questionable significance. No peristalsis is seen in this region.  Common bile duct:  Diameter: The common bile duct is within normal limits post cholecystectomy measuring 7.7 mm in diameter.  Liver:  The liver has a normal echogenic pattern. No focal hepatic abnormality is seen.  IMPRESSION: 1. No focal hepatic abnormality. 2. No change in a small cystic structure in the gallbladder fossa of questionable significance.   Electronically Signed   By: Ivar Drape M.D.   On: 02/11/2014 09:11  Ct Chest Low Dose Pilot W/o Cm  01/15/2013   *RADIOLOGY REPORT*  Clinical Data: Follow-up evaluation of previously noted lung lesions.  CT CHEST LOW DOSE PILOT WITHOUT CONTRAST  Technique: Multidetector CT imaging of the chest using the standard low-dose protocol without administration of intravenous contrast.  Comparison: Chest CT 11/07/2011.  Findings:  Mediastinum: Heart size is normal. There is  no significant pericardial fluid, thickening or pericardial calcification. There is atherosclerosis of the thoracic aorta, the great vessels of the mediastinum and the coronary arteries, including calcified atherosclerotic plaque in the left main, left anterior descending and right coronary arteries. No pathologically enlarged mediastinal or hilar lymph nodes. Please note that accurate exclusion of hilar adenopathy is limited on noncontrast CT scans.  Aberrant right subclavian artery (normal anatomical variant) incidentally noted. Esophagus is unremarkable in appearance.  Lungs/Pleura: The previously noted 4 mm right upper lobe ground- glass attenuation nodule is unchanged (image 113 of series 102). In the medial aspect of the left upper lobe (image 60 of series 102) there is a 12 x 8 mm ill-defined ground-glass attenuation nodule which is unchanged.  Likewise, in the periphery of the right lower lobe (image 100 of series 102) is a 1.3 x 0.8 cm pleural- based ground-glass attenuation nodule which is also unchanged.  No definite new suspicious appearing pulmonary nodules or masses are otherwise identified.  No acute consolidative air space disease. No pleural effusions.  There is a background of mild centrilobular emphysema.  Upper Abdomen: Status post cholecystectomy.  Musculoskeletal: There are no aggressive appearing lytic or blastic lesions noted in the visualized portions of the skeleton.  IMPRESSION: 1.  No significant change in numerous ground-glass attenuation nodules scattered  throughout the lungs bilaterally, as above. At this point, these nodules have been stable in size and appearance compared to prior study from 12/12/2010.  An additional 1 year follow-up is recommended at this time.  This recommendation follows the consensus statement: Recommendations for the Management of Subsolid Pulmonary Nodules Detected at CT:  A Statement from the [place] as published in Radiology 2013; 266:304-317. 2.  Mild centrilobular emphysema. 3. Atherosclerosis, including left main and two-vessel coronary artery disease. Please note that although the presence of coronary artery calcium documents the presence of coronary artery disease, the severity of this disease and any potential stenosis cannot be assessed on this non-gated CT examination.  Assessment for potential risk factor modification, dietary therapy or pharmacologic therapy may be warranted, if clinically indicated. 4.  Aberrant right subclavian artery (normal anatomical variant) incidentally noted. 5.  Status post cholecystectomy.   Original Report Authenticated By: Vinnie Langton, M.D.   Recent Lab Findings: Lab Results  Component Value Date   WBC 9.6 06/17/2014   HGB 11.8 (L) 06/17/2014   HCT 37.0 06/17/2014   PLT 370 06/17/2014   GLUCOSE 158 (H) 06/17/2014   CHOL 206 (H) 08/14/2012   TRIG 229.0 (H) 08/14/2012   HDL 40.80 08/14/2012   LDLDIRECT 139.3 08/14/2012   ALT 18 06/17/2014   AST 22 06/17/2014   NA 137 06/17/2014   K 3.8 06/17/2014   CL 106 06/17/2014   CREATININE 1.92 (H) 06/17/2014   BUN 28 (H) 06/17/2014   CO2 23 06/17/2014   TSH 0.864 09/27/2011   INR 0.9 11/24/2012   HGBA1C 8.1 (H) 06/24/2014   Chronic Kidney Disease   Stage I     GFR >90  Stage II    GFR 60-89  Stage IIIA GFR 45-59  Stage IIIB GFR 30-44  Stage IV   GFR 15-29  Stage V    GFR  <15  Lab Results  Component Value Date   CREATININE 1.92 (H) 06/17/2014   CrCl cannot be calculated (Patient's most recent lab result is  older than the maximum 21 days allowed.).   On [facility]/22/2016 at [facility] cr 1.5 egfr 42  Assessment / Plan:      1  numerous ground-glass attenuation nodules scattered throughout the lungs bilaterally,  Followed since  2012, Slowly  progressive enlargement of 2 subsolid lesions in the left upper lobe and periphery of the right lower lobe, Cannot rule out slow-growing adenocarcinoma in groundglass opacities.Discussed with patient  crurrently she is not interested in surgical intervention but is agreeable to   close follow up.   Plane repeat ct of chest in 6-7 months   2 Aberrant right subclavian artery (normal anatomical variant) incidentally noted. 3 Atherosclerosis, including left main and two-vessel coronary artery disease by non gated CT of the chest, asymptomatic. Discussed with patient and recommended immediate if becomes symptomatic 4 Hyperlipidemia, LDL 140 -with the patient's history of diabetes, calcification of coronary arteries but without symptoms, and persistent hyperlipidemia she will contact primary care and endocrinology to discuss advantages and disadvantages of statin therapy in her situation. 5 chronic kidney disease  Stage IIIB We'll see the patient in  6-7 months with followup CT scan to evaluate stability of lung nodules noted to be present since at least 2012.  Grace Isaac MD      [address] [place] [address] Office [phone]   Beeper [phone]  06/14/2016 2:15 PM

## 2016-09-24 ENCOUNTER — Other Ambulatory Visit: Payer: Self-pay | Admitting: Nurse Practitioner

## 2016-09-24 DIAGNOSIS — K7460 Unspecified cirrhosis of liver: Secondary | ICD-10-CM

## 2016-10-02 ENCOUNTER — Ambulatory Visit
Admission: RE | Admit: 2016-10-02 | Discharge: 2016-10-02 | Disposition: A | Discharge status: Home / Self Care | Payer: Medicare Other | Source: Ambulatory Visit | Attending: Nurse Practitioner | Admitting: Nurse Practitioner

## 2016-10-02 DIAGNOSIS — K7460 Unspecified cirrhosis of liver: Secondary | ICD-10-CM

## 2016-11-20 ENCOUNTER — Other Ambulatory Visit: Payer: Self-pay | Admitting: *Deleted

## 2016-11-20 DIAGNOSIS — R911 Solitary pulmonary nodule: Secondary | ICD-10-CM

## 2016-12-20 ENCOUNTER — Ambulatory Visit
Admission: RE | Admit: 2016-12-20 | Discharge: 2016-12-20 | Disposition: A | Discharge status: Home / Self Care | Payer: Medicare Other | Source: Ambulatory Visit | Attending: Cardiothoracic Surgery | Admitting: Cardiothoracic Surgery

## 2016-12-20 ENCOUNTER — Encounter: Payer: Self-pay | Admitting: Cardiothoracic Surgery

## 2016-12-20 ENCOUNTER — Ambulatory Visit (INDEPENDENT_AMBULATORY_CARE_PROVIDER_SITE_OTHER): Payer: Medicare Other | Admitting: Cardiothoracic Surgery

## 2016-12-20 VITALS — BP 145/74 | HR 67 | Resp 20 | Ht 63.0 in | Wt 167.0 lb

## 2016-12-20 DIAGNOSIS — R911 Solitary pulmonary nodule: Secondary | ICD-10-CM

## 2016-12-20 DIAGNOSIS — R918 Other nonspecific abnormal finding of lung field: Secondary | ICD-10-CM | POA: Diagnosis not present

## 2016-12-20 NOTE — Progress Notes (Signed)
[address]       [place] [address]             [phone]                    Ricki Debois [facility] Medical Record #[id number] Date of Birth: Jun 02, 1946  Referring: Lucille Passy, MD  Primary Care: Lin Landsman, MD  Chief Complaint:    Chief Complaint  Patient presents with  . Lung Lesion    6 month f/u with Chest CT    History of Present Illness:      She has a left upper lobe and right lower lobe groundglass nodules and several other small scattered nodules in the past and followed by  Dr Arlyce Dice. Patient returns today with a follow-up CT scan in 7 months since her previous one. She has been Followed since  2012, Slowly  progressive enlargement of 2 subsolid lesions in the left upper lobe and periphery of the right lower lobe, Cannot rule out slow-growing adenocarcinoma in groundglass opacities. This has been Discussed with patient in the past,  she was not interested in surgical intervention but is agreeable to   close follow up.       She is currently a nonsmoker,  stopped in 2013 prior she smoked for 10-12 years.   She has long-standing diabetes current hemoglobin A1c is 7.5, she does have significant hyperlipidemia with LDH 171,  Current Activity/ Functional Status:  Patient is independent with mobility/ambulation, transfers, ADL's, IADL's.  Zubrod Score: At the time of surgery this patient's most appropriate activity status/level should be described as: [x]  Normal activity, no symptoms []  Symptoms, fully ambulatory []  Symptoms, in bed less than or equal to 50% of the time []  Symptoms, in bed greater than 50% of the time but less than 100% []  Bedridden []  Moribund   Past Medical History:  Diagnosis Date  . Allergy   . Chronic kidney disease    stage 3  . Complication of anesthesia   . Diabetes mellitus   . Fibroids   . GERD (gastroesophageal reflux disease)   . Goiter   . Hepatitis    from blood transfusion, was treated in 2014  .  History of blood transfusion   . History of colon polyps   . Hyperlipidemia   . Hypertension   . Osteoporosis   . Pancreatitis   . PONV (postoperative nausea and vomiting)    years ago  . Seasonal allergies     Past Surgical History:  Procedure Laterality Date  . ABDOMINAL HYSTERECTOMY  1977  . CARPAL TUNNEL RELEASE Bilateral   . CHOLECYSTECTOMY    . COLONOSCOPY W/ POLYPECTOMY    . HAND SURGERY    . LUMBAR LAMINECTOMY/DECOMPRESSION MICRODISCECTOMY Left 06/24/2014   Procedure: Left Lumbar four-five microdiskectomy;  Surgeon: Ashok Pall, MD;  Location: [facility];  Service: Neurosurgery;  Laterality: Left;  Left Lumbar four-five microdiskectomy  . TONSILLECTOMY    . TONSILLECTOMY     Herniated disk with left leg pain - symptoms much improved  Family History  Problem Relation Age of Onset  . Breast cancer Mother   . Colon cancer Neg Hx   . Diabetes Mother   . Heart disease Mother     History   Social History  . Marital Status: Single    Spouse Name: N/A    Number of Children: 0  . Years of Education: N/A   Occupational History  .  Retired- Restaurant manager, fast food    Social History Main Topics  . Smoking status: Former Smoker    Quit date: 06/11/2008  . Smokeless tobacco: Never Used  . Alcohol Use: No  . Drug Use: No  . Sexually Active: Not on file    Social History Narrative    moved here from [place] to take care of mother with Alzheimers, who passed away last week  previously worked as a Counsellor for the [employer] . She denies any work with exposure to asbestos or cotton dust , she was a smoker for more than 15 years but quit in 2013 .      1 caffeine drink daily     History  Smoking Status  . Former Smoker  . Years: 11.00  . Quit date: 06/11/2008  Smokeless Tobacco  . Never Used    History  Alcohol Use No     Allergies  Allergen Reactions  . Atorvastatin     Other reaction(s): Muscle Pain  . Codeine Hives  .  Penicillins Hives and Swelling    Tongue and throat swelling  Has patient had a PCN reaction causing immediate rash, facial/tongue/throat swelling, SOB or lightheadedness with hypotension: Yes Has patient had a PCN reaction causing severe rash involving mucus membranes or skin necrosis: Yes Has patient had a PCN reaction that required hospitalization Yes Has patient had a PCN reaction occurring within the last 10 years: No If all of the above answers are "NO", then may proceed with Cephalosporin use.  . Sulfa Antibiotics Hives    Current Outpatient Prescriptions  Medication Sig Dispense Refill  . alendronate (FOSAMAX) 70 MG tablet Take 70 mg by mouth every 7 (seven) days. On Wednesdays    . aspirin EC 81 MG tablet Take 81 mg by mouth daily.    . cetirizine (ZYRTEC) 10 MG tablet Take 10 mg by mouth at bedtime.     Marland Kitchen diltiazem (CARDIZEM CD) 120 MG 24 hr capsule Take 120 mg by mouth daily.  5  . esomeprazole (NEXIUM) 40 MG capsule TAKE 1 CAPSULE (40MG  TOTAL ) BY MOUTH 2 TIMES DAILY (Patient taking differently: Take 40 mg by mouth 2 (two) times daily before a meal. ) 180 capsule 1  . fluticasone (FLONASE) 50 MCG/ACT nasal spray USE 2 SPRAYS IN EACH NOSTRIL DAILY (Patient taking differently: Place 2 sprays into both nostrils at bedtime. ) 48 g 1  . glimepiride (AMARYL) 4 MG tablet Take 1 tablet (4 mg total) by mouth 2 (two) times daily before a meal. (Patient taking differently: Take 4 mg by mouth daily with breakfast. ) 180 tablet 1  . glucose blood (ONE TOUCH ULTRA TEST) test strip Check blood sugar twice daily and as directed. Dx 250.4 (Patient taking differently: Check blood sugar three times daily and as directed. Dx 250.4) 100 each 5  . hydrochlorothiazide (HYDRODIURIL) 25 MG tablet Take 25 mg by mouth daily.    . insulin aspart (NOVOLOG FLEXPEN) 100 UNIT/ML FlexPen Inject into the skin 3 (three) times daily with meals. 10-15 units TID with meals    . insulin glargine (LANTUS SOLOSTAR) 100  UNIT/ML injection Inject 15 units twice a day. (Patient taking differently: Inject 46 Units into the skin daily. ) 15 pen 1  . Insulin Pen Needle (ULTICARE MINI PEN NEEDLES) 31G X 6 MM MISC Use to inject insulin daily 200 each 1  . Liraglutide (VICTOZA) 18 MG/3ML SOPN Inject 1.8 mg into the skin daily.    Marland Kitchen  Magnesium 200 MG TABS Take by mouth daily.    . Multiple Vitamin (MULITIVITAMIN WITH MINERALS) TABS Take 1 tablet by mouth daily. Centrum Silver    . ONE TOUCH LANCETS MISC Use to check blood sugar twice a day    . ranitidine (ZANTAC) 300 MG capsule Take 1 capsule (300 mg total) by mouth 2 (two) times daily. 180 capsule 1  . Red Yeast Rice Extract 600 MG CAPS Take by mouth daily.    Marland Kitchen tretinoin (RETIN-A) 0.05 % cream Apply topically at bedtime.     No current facility-administered medications for this visit.        Review of Systems:     Cardiac Review of Systems: Y or N  Chest Pain [  N ]  Resting SOB Aqua.Slicker   ] Exertional SOB  Aqua.Slicker  ]  Orthopnea Aqua.Slicker  ]   Pedal Edema [ N  PALPITATIONS [N] SYNCOPE [N] PRESYNCOPE [N]  GEN. REVIEW OF SYSTEMS: [Y] = YES []  = NO CONSTITIONAL: RECENT WEIGHT CHANGE [] ; ANOREXIA [] ; FATIGUE [] ; NAUSEA [] ; NIGHT SWEATS [] ; FEVER [] ; OR CHILLS [] ; DENTAL: POOR DENTITION [] ; LAST DENTIST VISIT:  EYE: BLURRED VISION [] ; DIPLOPIA [N]; VISION CHANGES [N]; AMAUROSIS FUGAX [N]; RESPIRATORY: COUGH [] ; WHEEZING [] ; HEMOPTYSIS [] ; SHORTNESS OF BREATH [] ; PAROXYSMAL NOCTURNAL DYSPNEA [] ; DYSPNEA ON EXERTION [] ; OR ORTHOPNEA [] ; GI: GALLSTONES [] , VOMITING [] ; DYSPHAGIA [] ; MELENA [] ; HEMATOCHEZIA [] ; HEARTBURN [] ; HX OF COLONOSCOPY [Y]; GU: KIDNEY STONES [N]; HEMATURIA [] ; DYSURIA [] ; NOCTURIA [] ; HISTORY OF OBSTRUCTION [] ; URINARY FREQUENCY [N] SKIN: RASH, SWELLING [] ;, HAIR LOSS [] ; PERIPHERAL EDEMA [] ; OR ITCHING [] ; MUSCULOSKETETAL: MYALGIAS [] ; JOINT SWELLING [] ; JOINT ERYTHEMA [] ; JOINT PAIN [] ; BACK PAIN [IMPROVED];  HEME/LYMPH: BRUISING [] ; BLEEDING [] ; ANEMIA  [] ; NEURO: TIA [] ; HEADACHES [] ; STROKE [] ; VERTIGO [] ; SEIZURES [] ; PARESTHESIAS [] ; DIFFICULTY WALKING [] ;  PSYCH: DEPRESSION [] ; ANXIETY [] ;  ENDOCRINE: DIABETES [] ; THYROID DYSFUNCTION [] ;  IMMUNIZATIONS: FLU [Y]; PNEUMOCOCCAL [N];  OTHER:  PHYSICAL EXAM: BP (!) 145/74   Pulse 67   Resp 20   Ht 5\' 3"  (1.6 m)   Wt 167 lb (75.8 kg)   SpO2 97% Comment: RA  BMI 29.58 kg/m   DIAGNOSTIC STUDIES & LABORATORY DATA:  RECENT RADIOLOGY FINDINGS:  CT CHEST WO CONTRAST  RESULT DATE: 12/20/2016 CLINICAL DATA: FOLLOW-UP PULMONARY NODULES. EXAM: CT CHEST WITHOUT CONTRAST TECHNIQUE: MULTIDETECTOR CT IMAGING OF THE CHEST WAS PERFORMED FOLLOWING THE STANDARD PROTOCOL WITHOUT IV CONTRAST. COMPARISON: CHEST CT 06/14/2016; 03/02/2014. FINDINGS: CARDIOVASCULAR: NORMAL HEART SIZE. CORONARY ARTERIAL VASCULAR CALCIFICATIONS. AORTIC ATHEROSCLEROSIS. MEDIASTINUM/NODES: NO ENLARGED AXILLARY, MEDIASTINAL OR HILAR LYMPHADENOPATHY. THE ESOPHAGUS IS NORMAL IN APPEARANCE. LUNGS/PLEURA: CENTRAL AIRWAYS ARE PATENT. SIMILAR-APPEARING 10 X 8 MM GROUNDGLASS NODULE WITHIN THE LEFT UPPER LOBE (IMAGE 21; SERIES 4). SIMILAR APPEARING 13 X 9 MM GROUNDGLASS COMPONENT OF PULMONARY NODULE WITHIN THE RIGHT LOWER LOBE (IMAGE 52; SERIES 4). GROSSLY UNCHANGED 6 MM SOLID COMPONENT (IMAGE 53; SERIES 4). UNCHANGED 6 MM GROUNDGLASS NODULE WITHIN THE RIGHT MIDDLE LOBE (IMAGE 39; SERIES 4). NO PLEURAL EFFUSION OR PNEUMOTHORAX. 5 MM SUBPLEURAL LEFT LOWER LOBE NODULE (IMAGE 70; SERIES 4), STABLE DATING BACK TO 2017. UPPER ABDOMEN: PATIENT STATUS POST CHOLECYSTECTOMY. NORMAL ADRENAL GLANDS. 3 MM NONOBSTRUCTING STONE INTERPOLAR REGION RIGHT KIDNEY. MUSCULOSKELETAL: NO AGGRESSIVE OR ACUTE APPEARING OSSEOUS LESIONS. IMPRESSION: WHEN COMPARED TO RECENT PRIOR EXAMINATION THERE HAS BEEN NO SIGNIFICANT INTERVAL CHANGE IN SIZE OF GROUNDGLASS NODULE WITHIN THE LEFT UPPER LOBE AS WELL AS MIXED SOLID AND GROUNDGLASS NODULE WITHIN  THE RIGHT LOWER LOBE. GIVEN  THE APPEARANCE OF THESE NODULES AND SLIGHT INTERVAL CHANGE OVER MULTIPLE EXAMS, SLOW GROWING ADENOCARCINOMA IS NOT EXCLUDED. AT A MINIMUM, RECOMMEND FOLLOW-UP CHEST CT IN 6 MONTHS. 5 MM SUBPLEURAL LEFT LOWER NO NODULE, STABLE DATING BACK TO 2017. RECOMMEND ATTENTION ON FOLLOW-UP. AORTIC ATHEROSCLEROSIS (ICD 10-I 70.0). ELECTRONICALLY SIGNED BY: Dian Situ DAVIS M.D. ON: 12/20/2016 946  CT CHEST WO CONTRAST  RESULT DATE: 06/14/2016 CLINICAL DATA: 39-YEAR-OLD FEMALE WITH HISTORY OF PULMONARY NODULES. ANNUAL FOLLOW-UP. EXAM: CT CHEST WITHOUT CONTRAST TECHNIQUE: MULTIDETECTOR CT IMAGING OF THE CHEST WAS PERFORMED FOLLOWING THE STANDARD PROTOCOL WITHOUT IV CONTRAST. COMPARISON: CHEST CT 09/22/2015 AND SEVERAL OTHER ARE MORE REMOTE PRIOR EXAMINATIONS. FINDINGS: NOTE: ON TODAY'S STUDY, THE PATIENT APPEARS TO HAVE BEEN PROPPED UPWARD ON THE CT SCANNER TABLE, RESULTING IN SLIGHTLY DIFFERENT SAMPLING OF THE PREVIOUSLY NOTED PULMONARY NODULES ON ANY GIVEN AXIAL IMAGE. CARDIOVASCULAR: HEART SIZE IS NORMAL. THERE IS NO SIGNIFICANT PERICARDIAL FLUID, THICKENING OR PERICARDIAL CALCIFICATION. THERE IS AORTIC ATHEROSCLEROSIS, AS WELL AS ATHEROSCLEROSIS OF THE GREAT VESSELS OF THE MEDIASTINUM AND THE CORONARY ARTERIES, INCLUDING CALCIFIED ATHEROSCLEROTIC PLAQUE IN THE LEFT MAIN, LEFT ANTERIOR DESCENDING, LEFT CIRCUMFLEX AND RIGHT CORONARY ARTERIES. 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 MEDIASTINUM/NODES: NO PATHOLOGICALLY ENLARGED MEDIASTINAL OR HILAR LYMPH NODES. PLEASE NOTE THAT ACCURATE EXCLUSION OF HILAR ADENOPATHY IS LIMITED ON NONCONTRAST CT SCANS. ESOPHAGUS IS UNREMARKABLE IN APPEARANCE. NO AXILLARY LYMPHADENOPATHY. LUNGS/PLEURA: PREVIOUSLY NOTED GROUNDGLASS ATTENUATION NODULE IN THE MEDIAL ASPECT OF THE LEFT UPPER LOBE IS STABLE IN SIZE MEASURING 11 X 8 MM (MEASUREMENTS WERE MADE ON IMAGES 22 AND 23 OF SERIES 4 TO DIRECTLY COMPARE WITH THE PRIOR STUDY AND COMPENSATE FOR SLIGHT  DIFFERENCE IN SAMPLING OF THIS LESION ON AXIAL IMAGES). THE OTHER PREVIOUSLY NOTED MIXED GROUNDGLASS ATTENUATION AND SOLID LESION IN THE PERIPHERY OF THE RIGHT LOWER LOBE DEMONSTRATES A SMALLER GROUNDGLASS ATTENUATION COMPONENT MEASURING ONLY 9 X 14 MM (AXIAL IMAGE 59 OF SERIES 4) ON TODAY'S STUDY (PREVIOUSLY 12 X 13 MM), BUT AN ENLARGING SOFT TISSUE ATTENUATION COMPONENT MEASURING 6 MM IN GREATEST LENGTH (AXIAL IMAGE 60 OF SERIES 3). NO OTHER NEW SUSPICIOUS APPEARING PULMONARY NODULES OR MASSES ARE NOTED. THERE IS NO ACUTE CONSOLIDATIVE AIRSPACE DISEASE, AND NO PLEURAL EFFUSIONS. UPPER ABDOMEN: AORTIC ATHEROSCLEROSIS. STATUS POST CHOLECYSTECTOMY. MUSCULOSKELETAL: THERE ARE NO AGGRESSIVE APPEARING LYTIC OR BLASTIC LESIONS NOTED IN THE VISUALIZED PORTIONS OF THE SKELETON. IMPRESSION: 1. PREVIOUSLY NOTED GROUNDGLASS ATTENUATION NODULE IN THE LEFT UPPER LOBE IS STABLE. 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 GIVEN THE SMALL SIZE OF THIS NODULE, THE PET CT MAY BE OF LIMITED VALUE. AT THE VERY LEAST, CLOSE ATTENTION ON FOLLOW-UP CHEST CT IS RECOMMENDED. ALTERNATIVELY, CONSIDERATION FOR BIOPSY OR WEDGE RESECTION MAY BE WARRANTED IF CLINICALLY APPROPRIATE. 2. AORTIC ATHEROSCLEROSIS, IN ADDITION TO LEFT MAIN AND 3 VESSEL CORONARY ARTERY DISEASE. PLEASE NOTE THAT ALTHOUGH THE PRESENCE OF CORONARY ARTERY CALCIUM DOCUMENTS THE PRESENCE OF CORONARY ARTERY DISEASE, THE SEVERITY OF THIS DISEASE AND ANY POTENTIAL STENOSIS CANNOT BE ASSESSED ON THIS NON-GATED CT  EXAMINATION. ASSESSMENT FOR POTENTIAL RISK FACTOR MODIFICATION, DIETARY THERAPY OR PHARMACOLOGIC THERAPY MAY BE  WARRANTED, IF CLINICALLY INDICATED. THESE RESULTS WILL BE CALLED TO THE ORDERING CLINICIAN OR REPRESENTATIVE BY THE RADIOLOGIST ASST., AND COMMUNICATION DOCUMENTED IN THE PACS OR ZVISION DASHBOARD. ELECTRONICALLY SIGNED BY: Vinnie Langton M.D. ON: 06/14/2016 1253  I HAVE INDEPENDENTLY REVIEWED THE ABOVE RADIOLOGY STUDIES AND REVIEWED THE FINDINGS WITH THE PATIENT.    CT CHEST WO CONTRAST  09/22/2015 CLINICAL DATA: 61-YEAR-OLD FEMALE WITH HISTORY OF PULMONARY NODULE. FOLLOW-UP STUDY. EXAM: CT CHEST WITHOUT CONTRAST TECHNIQUE: MULTIDETECTOR CT IMAGING OF THE CHEST WAS PERFORMED FOLLOWING THE STANDARD PROTOCOL WITHOUT IV CONTRAST. COMPARISON: CHEST CT 09/16/2014. FINDINGS: MEDIASTINUM/LYMPH NODES: HEART SIZE IS NORMAL. THERE IS NO SIGNIFICANT PERICARDIAL FLUID, THICKENING OR PERICARDIAL CALCIFICATION. THERE IS ATHEROSCLEROSIS OF THE THORACIC AORTA, THE GREAT VESSELS OF THE MEDIASTINUM AND THE CORONARY ARTERIES, INCLUDING CALCIFIED ATHEROSCLEROTIC PLAQUE IN THE LEFT MAIN, LEFT ANTERIOR DESCENDING, LEFT CIRCUMFLEX AND RIGHT CORONARY ARTERIES. 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 NO PATHOLOGICALLY ENLARGED MEDIASTINAL OR HILAR LYMPH NODES. PLEASE NOTE THAT ACCURATE EXCLUSION OF HILAR ADENOPATHY IS LIMITED ON NONCONTRAST CT SCANS. ESOPHAGUS IS UNREMARKABLE IN APPEARANCE. NO AXILLARY LYMPHADENOPATHY. LUNGS/PLEURA: THE NODULE IN THE PERIPHERY OF THE RIGHT LOWER LOBE APPEARS SLIGHTLY LARGER THAN THE PRIOR STUDY, BUT IS PREDOMINANTLY GROUNDGLASS ATTENUATION MEASURING UP TO 13 X 12 MM (IMAGE 70 OF SERIES 8) WITH ONLY A SMALL 3 MM SOLID INTERNAL COMPONENT (IMAGE 70 OF SERIES 7). PREVIOUSLY NOTED GROUNDGLASS ATTENUATION NODULE IN THE MEDIAL ASPECT OF THE LEFT UPPER LOBE IS ALSO SLIGHTLY LARGER THAN THE PRIOR EXAMINATION MEASURING 11 X 7 MM ON TODAY'S STUDY, WITH NO  CENTRAL SOLID COMPONENT. NO NEW SUSPICIOUS APPEARING PULMONARY NODULES OR MASSES ARE OTHERWISE NOTED. NO ACUTE CONSOLIDATIVE AIRSPACE DISEASE. NO PLEURAL EFFUSIONS. MILD DIFFUSE BRONCHIAL WALL THICKENING WITH MILD CENTRILOBULAR EMPHYSEMA. UPPER ABDOMEN: STATUS POST CHOLECYSTECTOMY. ATHEROSCLEROSIS. MUSCULOSKELETAL/SOFT TISSUES: THERE ARE NO AGGRESSIVE APPEARING LYTIC OR BLASTIC LESIONS NOTED IN THE VISUALIZED PORTIONS OF THE SKELETON. IMPRESSION: 1. SLOW PROGRESSIVE ENLARGEMENT OF 2 SUBSOLID LESIONS IN THE LEFT UPPER LOBE AND PERIPHERY OF THE RIGHT LOWER LOBE, AS ABOVE. THESE LESIONS REMAINS CONCERNING FOR POTENTIAL SLOW-GROWING NEOPLASM SUCH AS ADENOCARCINOMA. AT THIS TIME, CONTINUED IMAGING SURVEILLANCE IS RECOMMENDED. SPECIFICALLY, REPEAT NONCONTRAST CHEST CT IS RECOMMENDED IN 12 MONTHS. THIS RECOMMENDATION FOLLOWS THE CONSENSUS STATEMENT: GUIDELINES FOR MANAGEMENT OF INCIDENTAL PULMONARY NODULES DETECTED ON CT IMAGES: FROM THE [facility] 2017; PUBLISHED ONLINE BEFORE [facility] ([phone]/RADIOL [phone]). 2. MILD DIFFUSE BRONCHIAL WALL THICKENING WITH MILD CENTRILOBULAR EMPHYSEMA; IMAGING FINDINGS SUGGESTIVE OF UNDERLYING COPD 3. ATHEROSCLEROSIS, INCLUDING LEFT MAIN AND 3 VESSEL CORONARY ARTERY DISEASE. PLEASE NOTE THAT ALTHOUGH THE PRESENCE OF CORONARY ARTERY CALCIUM DOCUMENTS THE PRESENCE OF CORONARY ARTERY DISEASE, THE SEVERITY OF THIS DISEASE AND ANY POTENTIAL STENOSIS CANNOT BE ASSESSED ON THIS NON-GATED CT EXAMINATION. ASSESSMENT FOR POTENTIAL RISK FACTOR MODIFICATION, DIETARY THERAPY OR PHARMACOLOGIC THERAPY MAY BE WARRANTED, IF CLINICALLY INDICATED. ELECTRONICALLY SIGNED BY: Vinnie Langton M.D. ON: 09/22/2015 903  CT CHEST WO CONTRAST  09/16/2014 CLINICAL DATA: FORMER SMOKER. FOLLOW-UP INDETERMINATE PULMONARY NODULE. EXAM: CT CHEST WITHOUT CONTRAST TECHNIQUE: MULTIDETECTOR CT IMAGING OF THE CHEST WAS PERFORMED FOLLOWING THE STANDARD PROTOCOL WITHOUT IV CONTRAST. COMPARISON: CT 03/02/2014,  01/15/2013 FINDINGS: MEDIASTINUM/NODES: NO AXILLARY OR SUPRACLAVICULAR LYMPHADENOPATHY. NO MEDIASTINAL HILAR LYMPHADENOPATHY. NO PERICARDIAL FLUID. CORONARY ARTERY CALCIFICATIONS ARE NOTED. ESOPHAGUS IS NORMAL. LUNGS/PLEURA: THE RIGHT LOWER LOBE PULMONARY NODULE OF CONCERN MEASURES 9 MM X 4 MM NOT CHANGED FROM 10 MM X 5 MM (IMAGE 28, SERIES 4). THIS LESION IS PROBABLY BEST SEEN ON THE CORONAL IMAGING MEASURING 6 MM ON IMAGE 74 COMPARED  TO 6 MM ON PRIOR. A GROUNDGLASS NODULE IN THE LEFT UPPER LOBE MEASURES 8 MM (IMAGE 12, SERIES 4) UNCHANGED FROM 8 MM ON PRIOR. NO NEW PULMONARY NODULES ARE PRESENT. UPPER ABDOMEN: LIMITED VIEW OF THE LIVER, KIDNEYS, PANCREAS ARE UNREMARKABLE. NORMAL ADRENAL GLANDS. MUSCULOSKELETAL: NO AGGRESSIVE OSSEOUS LESION. IMPRESSION: 1. STABLE RIGHT LOWER LOBE PULMONARY NODULE OVER 7 MONTH INTERVAL. RECOMMEND FOLLOW-UP CT WITHOUT CONTRAST IN 12 MONTHS. 2. STABLE LEFT UPPER LOBE PULMONARY NODULE OVER GREATER THAN 3 YEARS. ELECTRONICALLY SIGNED BY: Suzy Bouchard M.D. ON: 09/16/2014 1232   CT CHEST WO CONTRAST  03/02/2014 CLINICAL DATA: FALL PULMONARY NODULES EXAM: CT CHEST WITHOUT CONTRAST TECHNIQUE: MULTIDETECTOR CT IMAGING OF THE CHEST WAS PERFORMED FOLLOWING THE STANDARD PROTOCOL WITHOUT IV CONTRAST. COMPARISON: NONE. FINDINGS: LEFT UPPER LOBE PULMONARY NODULE MEASURING 9 MM IS UNCHANGED FROM PRIOR THIS NODULE IS STABLE OVER 3 YEARS. THE RIGHT LOWER LOBE PULMONARY NODULE HAS CHANGE IN MORPHOLOGY FROM A GROUNDGLASS NODULE TO A SOLID NODULE MEASURING 10 MM ABOUT 5 MM AND HAS AN ELONGATED MORPHOLOGY (IMAGE 34, SERIES 4). OVERALL SIZE LESION IS NOT CHANGED AS PREVIOUSLY MEASURED 15 X 9 MM NO NEW PULMONARY NODULES PRESENT. NO AXILLARY SUPRACLAVICULAR LYMPHADENOPATHY. NO MEDIASTINAL HILAR LYMPHADENOPATHY. NO PERICARDIAL FLUID. ADRENAL GLANDS ARE NORMAL. POSTCHOLECYSTECTOMY. NO AGGRESSIVE OSSEOUS LESION. IMPRESSION: 1. STABLE LEFT UPPER LOBE PULMONARY NODULE OVER 3 YEAR DISTAL BENIGN ETIOLOGY.  2. CHANGE IN MORPHOLOGY OF THE RIGHT LOWER LOBE FROM GROUNDGLASS TO SOLID NODULE. RECOMMEND FOLLOW-UP CT WITHOUT CONTRAST IN 12 MONTHS TO REASSESS THIS CHANGING BUT NOT ENLARGING NODULE. ELECTRONICALLY SIGNED BY: Suzy Bouchard M.D. ON: 03/03/2019 1514:59  US ABDOMEN LIMITED RUQ  02/11/2014 CLINICAL DATA: HEPATOMA SURVEILLANCE, ON MEDICATION FOR HYPERTENSION EXAM: US ABDOMEN LIMITED-RIGHT UPPER QUADRANT COMPARISON: ULTRASOUND OF THE RIGHT UPPER QUADRANT OF 03/04/2013 FINDINGS: GALLBLADDER: THE GALLBLADDER HAS PREVIOUSLY BEEN RESECTED. AS NOTED ON THE PRIOR ULTRASOUND FROM 2014, A SMALL CYSTIC STRUCTURE REMAINS IN THE GALLBLADDER FOSSA OF 1.30.9 X 1.1 CM AND IS UNCHANGED, OF QUESTIONABLE SIGNIFICANCE. NO PERISTALSIS IS SEEN IN THIS REGION. COMMON BILE DUCT: DIAMETER: THE COMMON BILE DUCT IS WITHIN NORMAL LIMITS POST CHOLECYSTECTOMY MEASURING 7.7 MM IN DIAMETER. LIVER: THE LIVER HAS A NORMAL ECHOGENIC PATTERN. NO FOCAL HEPATIC ABNORMALITY IS SEEN. IMPRESSION: 1. NO FOCAL HEPATIC ABNORMALITY. 2. NO CHANGE IN A SMALL CYSTIC STRUCTURE IN THE GALLBLADDER FOSSA OF QUESTIONABLE SIGNIFICANCE. ELECTRONICALLY SIGNED BY: Ivar Drape M.D. ON: 02/11/2014 911 CT CHEST LOW DOSE PILOT WITHOUT CENTIMETERS  01/15/2013*RADIOLOGY REPORT*CLINICAL DATA: FOLLOW-UP EVALUATION OF PREVIOUSLY NOTED LUNG LESIONS. CT CHEST LOW DOSE PILOT WITHOUT CONTRAST TECHNIQUE: MULTIDETECTOR CT IMAGING OF THE CHEST USING THE STANDARD LOW-DOSE PROTOCOL WITHOUT ADMINISTRATION OF INTRAVENOUS CONTRAST. COMPARISON: CHEST CT 11/07/2011. FINDINGS: MEDIASTINUM: HEART SIZE IS NORMAL. THERE IS NO SIGNIFICANT PERICARDIAL FLUID, THICKENING OR PERICARDIAL CALCIFICATION. THERE IS ATHEROSCLEROSIS OF THE THORACIC AORTA, THE GREAT VESSELS OF THE MEDIASTINUM AND THE CORONARY ARTERIES, INCLUDING CALCIFIED ATHEROSCLEROTIC PLAQUE IN THE LEFT MAIN, LEFT ANTERIOR DESCENDING AND RIGHT CORONARY ARTERIES. NO PATHOLOGICALLY ENLARGED MEDIASTINAL OR HILAR LYMPH NODES. PLEASE  NOTE THAT ACCURATE EXCLUSION OF HILAR ADENOPATHY IS LIMITED ON NONCONTRAST CT SCANS. 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 ESOPHAGUS IS UNREMARKABLE IN APPEARANCE. LUNGS/PLEURA: THE PREVIOUSLY NOTED 4 MM RIGHT UPPER LOBE GROUND-GLASS ATTENUATION NODULE IS UNCHANGED (IMAGE 113 OF SERIES 102). IN THE MEDIAL ASPECT OF THE LEFT UPPER LOBE (IMAGE 60 OF SERIES 102) THERE IS A 12 X 8 MM ILL-DEFINED GROUNDGLASS ATTENUATION NODULE WHICH IS UNCHANGED. LIKEWISE, IN THE PERIPHERY OF THE RIGHT LOWER LOBE (IMAGE 100 OF SERIES  102) IS A 1.3 X 0.8 CM PLEURAL-BASED GROUNDGLASS ATTENUATION NODULE WHICH IS ALSO UNCHANGED. NO DEFINITE NEW SUSPICIOUS APPEARING PULMONARY NODULES OR MASSES ARE OTHERWISE IDENTIFIED. NO ACUTE CONSOLIDATIVE AIR SPACE DISEASE. NO PLEURAL EFFUSIONS. 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 UPPER ABDOMEN: STATUS POST CHOLECYSTECTOMY. MUSCULOSKELETAL: THERE ARE NO AGGRESSIVE APPEARING LYTIC OR BLASTIC LESIONS NOTED IN THE VISUALIZED PORTIONS OF THE SKELETON. IMPRESSION: 1. NO SIGNIFICANT CHANGE IN NUMEROUS GROUNDGLASS ATTENUATION NODULES SCATTERED THROUGHOUT THE LUNGS BILATERALLY, AS ABOVE. AT THIS POINT, THESE NODULES HAVE BEEN STABLE IN SIZE AND APPEARANCE COMPARED TO PRIOR STUDY FROM 12/12/2010. AN ADDITIONAL 1 YEAR FOLLOW-UP IS RECOMMENDED AT THIS TIME. THIS RECOMMENDATION FOLLOWS THE CONSENSUS STATEMENT: RECOMMENDATIONS FOR THE MANAGEMENT OF SUBSOLID PULMONARY NODULES DETECTED AT CT: A STATEMENT FROM THE [facility] AS PUBLISHED IN RADIOLOGY 2013; [phone]. 2. MILD CENTRILOBULAR EMPHYSEMA. 3. 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 PLEASE NOTE THAT ALTHOUGH THE PRESENCE OF CORONARY ARTERY CALCIUM DOCUMENTS THE PRESENCE OF CORONARY ARTERY DISEASE, THE SEVERITY OF THIS DISEASE AND ANY POTENTIAL STENOSIS CANNOT BE ASSESSED ON THIS NON-GATED CT EXAMINATION. ASSESSMENT FOR POTENTIAL RISK FACTOR MODIFICATION,  DIETARY THERAPY OR PHARMACOLOGIC THERAPY MAY BE WARRANTED, IF CLINICALLY INDICATED. 4. 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 5. STATUS POST CHOLECYSTECTOMY. ORIGINAL REPORT AUTHENTICATED BY: Vinnie Langton, M.D.  Chronic Kidney Disease   Stage I     GFR >90  Stage II    GFR 60-89  Stage IIIA GFR 45-59  Stage IIIB GFR 30-44  Stage IV   GFR 15-29  Stage V    GFR  <15  Lab Results  Component Value Date   CREATININE 1.92 (H) 06/17/2014   CrCl cannot be calculated (Patient's most recent lab result is older than the maximum 21 days allowed.).   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 THE SCAN TODAY SHOWS STABILITY OVER THE LAST 8 MONTHS. I'VE REVIEWED WITH THE PATIENT THE POTENTIAL DIAGNOSIS OF MULTIPLE SMALL CARCINOMAS. AT THIS POINT SHE IS NOT INTERESTED IN ANY FURTHER INTERVENTION OTHER THAN FOLLOW-UP CT SCAN. [place] CT SCAN IN 8 MONTHS/   2   UNCHANGED -ABERRANT RIGHT SUBCLAVIAN ARTERY (NORMAL ANATOMICAL VARIANT) INCIDENTALLY NOTED. 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 DISCUSSED WITH PATIENT AND RECOMMENDED IMMEDIATE IF BECOMES SYMPTOMATIC 4 HYPERLIPIDEMIA, LDL 140-WITH THE PATIENT'S HISTORY OF DIABETES, CALCIFICATION OF CORONARY ARTERIES BUT WITHOUT SYMPTOMS, AND PERSISTENT HYPERLIPIDEMIA SHE  HAS DISCUSSED WITH HER PRIMARY CARE AND ENDOCRINOLOGY TO DISCUSS ADVANTAGES AND DISADVANTAGES OF STATIN THERAPY IN HER SITUATION. 5/ CHRONIC RENAL INSUFFICIENCY- MOST RECENT CREATININE DOCUMENTED IN EPIC 1.92.  WE'LL SEE THE PATIENT IN MONTHSS WITH FOLLOW-UP CT SCAN TO EVALUATE STABILITY OF LUNG NODULEIN 8 MONTHS Grace Isaac  M.D.  [address] [place]. [address] [place], [place] [address] Office [phone]   Beeper [phone]  12/20/2016 10:22 AM

## 2017-07-23 ENCOUNTER — Other Ambulatory Visit: Payer: Self-pay | Admitting: *Deleted

## 2017-07-23 DIAGNOSIS — R911 Solitary pulmonary nodule: Secondary | ICD-10-CM

## 2017-08-21 ENCOUNTER — Other Ambulatory Visit: Payer: Self-pay | Admitting: Nurse Practitioner

## 2017-08-21 DIAGNOSIS — K7460 Unspecified cirrhosis of liver: Secondary | ICD-10-CM

## 2017-08-27 ENCOUNTER — Other Ambulatory Visit: Payer: Medicare Other

## 2017-08-27 ENCOUNTER — Ambulatory Visit
Admission: RE | Admit: 2017-08-27 | Discharge: 2017-08-27 | Disposition: A | Discharge status: Home / Self Care | Payer: Medicare Other | Source: Ambulatory Visit | Attending: Cardiothoracic Surgery | Admitting: Cardiothoracic Surgery

## 2017-08-27 ENCOUNTER — Ambulatory Visit: Payer: Medicare Other | Admitting: Cardiothoracic Surgery

## 2017-08-27 DIAGNOSIS — R911 Solitary pulmonary nodule: Secondary | ICD-10-CM

## 2017-08-27 NOTE — Progress Notes (Deleted)
[address]       ,[place] [address]             [phone]                    Audrey Green [facility] Medical Record #[id number] Date of Birth: [birth year]  Referring: Lucille Passy, MD  Primary Care: Lin Landsman, MD  Chief Complaint:    No chief complaint on file.   History of Present Illness:      She has a left upper lobe and right lower lobe groundglass nodules and several other small scattered nodules in the past and followed by  Dr Arlyce Dice. Patient returns today with a follow-up CT scan in 7 months since her previous one. She has been Followed since  2012, Slowly  progressive enlargement of 2 subsolid lesions in the left upper lobe and periphery of the right lower lobe, Cannot rule out slow-growing adenocarcinoma in groundglass opacities. This has been Discussed with patient in the past,  she was not interested in surgical intervention but is agreeable to   close follow up.       She is currently a nonsmoker,  stopped in 2013 prior she smoked for 10-12 years.   She has long-standing diabetes current hemoglobin A1c is 7.5, she does have significant hyperlipidemia with LDH 171,  Current Activity/ Functional Status:  Patient is independent with mobility/ambulation, transfers, ADL's, IADL's.  Zubrod Score: At the time of surgery this patient's most appropriate activity status/level should be described as: [x]  Normal activity, no symptoms []  Symptoms, fully ambulatory []  Symptoms, in bed less than or equal to 50% of the time []  Symptoms, in bed greater than 50% of the time but less than 100% []  Bedridden []  Moribund   Past Medical History:  Diagnosis Date  . Allergy   . Chronic kidney disease    stage 3  . Complication of anesthesia   . Diabetes mellitus   . Fibroids   . GERD (gastroesophageal reflux disease)   . Goiter   . Hepatitis    from blood transfusion, was treated in 2014  . History of blood transfusion   . History of colon polyps   .  Hyperlipidemia   . Hypertension   . Osteoporosis   . Pancreatitis   . PONV (postoperative nausea and vomiting)    years ago  . Seasonal allergies     Past Surgical History:  Procedure Laterality Date  . ABDOMINAL HYSTERECTOMY  1977  . CARPAL TUNNEL RELEASE Bilateral   . CHOLECYSTECTOMY    . COLONOSCOPY W/ POLYPECTOMY    . HAND SURGERY    . LUMBAR LAMINECTOMY/DECOMPRESSION MICRODISCECTOMY Left 06/24/2014   Procedure: Left Lumbar four-five microdiskectomy;  Surgeon: Ashok Pall, MD;  Location: [facility];  Service: Neurosurgery;  Laterality: Left;  Left Lumbar four-five microdiskectomy  . TONSILLECTOMY    . TONSILLECTOMY     Herniated disk with left leg pain - symptoms much improved  Family History  Problem Relation Age of Onset  . Breast cancer Mother   . Colon cancer Neg Hx   . Diabetes Mother   . Heart disease Mother     History   Social History  . Marital Status: Single    Spouse Name: N/A    Number of Children: 0  . Years of Education: N/A   Occupational History  . Retired- Restaurant manager, fast food    Social History Main Topics  .  Smoking status: Former Smoker    Quit date: 06/11/2008  . Smokeless tobacco: Never Used  . Alcohol Use: No  . Drug Use: No  . Sexually Active: Not on file    Social History Narrative    moved here from [place] to take care of mother with Alzheimers, who passed away last week  previously worked as a Counsellor for the [employer] . She denies any work with exposure to asbestos or cotton dust , she was a smoker for more than 15 years but quit in 2013 .      1 caffeine drink daily     Social History   Tobacco Use  Smoking Status Former Smoker  . Years: 11.00  . Last attempt to quit: 06/11/2008  . Years since quitting: 9.2  Smokeless Tobacco Never Used    Social History   Substance and Sexual Activity  Alcohol Use No     Allergies  Allergen Reactions  . Atorvastatin     Other reaction(s): Muscle  Pain  . Codeine Hives  . Penicillins Hives and Swelling    Tongue and throat swelling  Has patient had a PCN reaction causing immediate rash, facial/tongue/throat swelling, SOB or lightheadedness with hypotension: Yes Has patient had a PCN reaction causing severe rash involving mucus membranes or skin necrosis: Yes Has patient had a PCN reaction that required hospitalization Yes Has patient had a PCN reaction occurring within the last 10 years: No If all of the above answers are "NO", then may proceed with Cephalosporin use.  . Sulfa Antibiotics Hives    Current Outpatient Medications  Medication Sig Dispense Refill  . alendronate (FOSAMAX) 70 MG tablet Take 70 mg by mouth every 7 (seven) days. On Wednesdays    . aspirin EC 81 MG tablet Take 81 mg by mouth daily.    . cetirizine (ZYRTEC) 10 MG tablet Take 10 mg by mouth at bedtime.     Marland Kitchen diltiazem (CARDIZEM CD) 120 MG 24 hr capsule Take 120 mg by mouth daily.  5  . esomeprazole (NEXIUM) 40 MG capsule TAKE 1 CAPSULE (40MG  TOTAL ) BY MOUTH 2 TIMES DAILY (Patient taking differently: Take 40 mg by mouth 2 (two) times daily before a meal. ) 180 capsule 1  . fluticasone (FLONASE) 50 MCG/ACT nasal spray USE 2 SPRAYS IN EACH NOSTRIL DAILY (Patient taking differently: Place 2 sprays into both nostrils at bedtime. ) 48 g 1  . glimepiride (AMARYL) 4 MG tablet Take 1 tablet (4 mg total) by mouth 2 (two) times daily before a meal. (Patient taking differently: Take 4 mg by mouth daily with breakfast. ) 180 tablet 1  . glucose blood (ONE TOUCH ULTRA TEST) test strip Check blood sugar twice daily and as directed. Dx 250.4 (Patient taking differently: Check blood sugar three times daily and as directed. Dx 250.4) 100 each 5  . hydrochlorothiazide (HYDRODIURIL) 25 MG tablet Take 25 mg by mouth daily.    . insulin aspart (NOVOLOG FLEXPEN) 100 UNIT/ML FlexPen Inject into the skin 3 (three) times daily with meals. 10-15 units TID with meals    . insulin glargine  (LANTUS SOLOSTAR) 100 UNIT/ML injection Inject 15 units twice a day. (Patient taking differently: Inject 46 Units into the skin daily. ) 15 pen 1  . Insulin Pen Needle (ULTICARE MINI PEN NEEDLES) 31G X 6 MM MISC Use to inject insulin daily 200 each 1  . Liraglutide (VICTOZA) 18 MG/3ML SOPN Inject 1.8 mg into the skin daily.    Marland Kitchen  Magnesium 200 MG TABS Take by mouth daily.    . Multiple Vitamin (MULITIVITAMIN WITH MINERALS) TABS Take 1 tablet by mouth daily. Centrum Silver    . ONE TOUCH LANCETS MISC Use to check blood sugar twice a day    . ranitidine (ZANTAC) 300 MG capsule Take 1 capsule (300 mg total) by mouth 2 (two) times daily. 180 capsule 1  . Red Yeast Rice Extract 600 MG CAPS Take by mouth daily.    Marland Kitchen tretinoin (RETIN-A) 0.05 % cream Apply topically at bedtime.     No current facility-administered medications for this visit.        Review of Systems:  ROS     PHYSICAL EXAM: There were no vitals taken for this visit. {physical exam:21449}  DIAGNOSTIC STUDIES & LABORATORY DATA:  RECENT RADIOLOGY FINDINGS:  CT CHEST WO CONTRAST  RESULT DATE: 12/20/2016 CLINICAL DATA: FOLLOW-UP PULMONARY NODULES. EXAM: CT CHEST WITHOUT CONTRAST TECHNIQUE: MULTIDETECTOR CT IMAGING OF THE CHEST WAS PERFORMED FOLLOWING THE STANDARD PROTOCOL WITHOUT IV CONTRAST. COMPARISON: CHEST CT 06/14/2016; 03/02/2014. FINDINGS: CARDIOVASCULAR: NORMAL HEART SIZE. CORONARY ARTERIAL VASCULAR CALCIFICATIONS. AORTIC ATHEROSCLEROSIS. MEDIASTINUM/NODES: NO ENLARGED AXILLARY, MEDIASTINAL OR HILAR LYMPHADENOPATHY. THE ESOPHAGUS IS NORMAL IN APPEARANCE. LUNGS/PLEURA: CENTRAL AIRWAYS ARE PATENT. SIMILAR-APPEARING 10 X 8 MM GROUNDGLASS NODULE WITHIN THE LEFT UPPER LOBE (IMAGE 21; SERIES 4). SIMILAR APPEARING 13 X 9 MM GROUNDGLASS COMPONENT OF PULMONARY NODULE WITHIN THE RIGHT LOWER LOBE (IMAGE 52; SERIES 4). GROSSLY UNCHANGED 6 MM SOLID COMPONENT (IMAGE 53; SERIES 4). UNCHANGED 6 MM GROUNDGLASS NODULE WITHIN THE RIGHT MIDDLE  LOBE (IMAGE 39; SERIES 4). NO PLEURAL EFFUSION OR PNEUMOTHORAX. 5 MM SUBPLEURAL LEFT LOWER LOBE NODULE (IMAGE 70; SERIES 4), STABLE DATING BACK TO 2017. UPPER ABDOMEN: PATIENT STATUS POST CHOLECYSTECTOMY. NORMAL ADRENAL GLANDS. 3 MM NONOBSTRUCTING STONE INTERPOLAR REGION RIGHT KIDNEY. MUSCULOSKELETAL: NO AGGRESSIVE OR ACUTE APPEARING OSSEOUS LESIONS. IMPRESSION: WHEN COMPARED TO RECENT PRIOR EXAMINATION THERE HAS BEEN NO SIGNIFICANT INTERVAL CHANGE IN SIZE OF GROUNDGLASS NODULE WITHIN THE LEFT UPPER LOBE AS WELL AS MIXED SOLID AND GROUNDGLASS NODULE WITHIN THE RIGHT LOWER LOBE. GIVEN THE APPEARANCE OF THESE NODULES AND SLIGHT INTERVAL CHANGE OVER MULTIPLE EXAMS, SLOW GROWING ADENOCARCINOMA IS NOT EXCLUDED. AT A MINIMUM, RECOMMEND FOLLOW-UP CHEST CT IN 6 MONTHS. 5 MM SUBPLEURAL LEFT LOWER NO NODULE, STABLE DATING BACK TO 2017. RECOMMEND ATTENTION ON FOLLOW-UP. AORTIC ATHEROSCLEROSIS (ICD 10-I 70.0). ELECTRONICALLY SIGNED BY: Dian Situ DAVIS M.D. ON: 12/20/2016 946  CT CHEST WO CONTRAST  RESULT DATE: 06/14/2016 CLINICAL DATA: 46-YEAR-OLD FEMALE WITH HISTORY OF PULMONARY NODULES. ANNUAL FOLLOW-UP. EXAM: CT CHEST WITHOUT CONTRAST TECHNIQUE: MULTIDETECTOR CT IMAGING OF THE CHEST WAS PERFORMED FOLLOWING THE STANDARD PROTOCOL WITHOUT IV CONTRAST. COMPARISON: CHEST CT 09/22/2015 AND SEVERAL OTHER ARE MORE REMOTE PRIOR EXAMINATIONS. FINDINGS: NOTE: ON TODAY'S STUDY, THE PATIENT APPEARS TO HAVE BEEN PROPPED UPWARD ON THE CT SCANNER TABLE, RESULTING IN SLIGHTLY DIFFERENT SAMPLING OF THE PREVIOUSLY NOTED PULMONARY NODULES ON ANY GIVEN AXIAL IMAGE. CARDIOVASCULAR: HEART SIZE IS NORMAL. THERE IS NO SIGNIFICANT PERICARDIAL FLUID, THICKENING OR PERICARDIAL CALCIFICATION. THERE IS AORTIC ATHEROSCLEROSIS, AS WELL AS ATHEROSCLEROSIS OF THE GREAT VESSELS OF THE MEDIASTINUM AND THE CORONARY ARTERIES, INCLUDING CALCIFIED ATHEROSCLEROTIC PLAQUE IN THE LEFT MAIN, LEFT ANTERIOR DESCENDING, LEFT CIRCUMFLEX AND RIGHT CORONARY ARTERIES.  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 MEDIASTINUM/NODES: NO PATHOLOGICALLY ENLARGED MEDIASTINAL OR HILAR LYMPH NODES. PLEASE NOTE THAT ACCURATE EXCLUSION OF HILAR ADENOPATHY IS LIMITED ON NONCONTRAST CT SCANS. ESOPHAGUS IS UNREMARKABLE IN APPEARANCE. NO AXILLARY LYMPHADENOPATHY. LUNGS/PLEURA: PREVIOUSLY NOTED GROUNDGLASS ATTENUATION NODULE IN  THE MEDIAL ASPECT OF THE LEFT UPPER LOBE IS STABLE IN SIZE MEASURING 11 X 8 MM (MEASUREMENTS WERE MADE ON IMAGES 22 AND 23 OF SERIES 4 TO DIRECTLY COMPARE WITH THE PRIOR STUDY AND COMPENSATE FOR SLIGHT DIFFERENCE IN SAMPLING OF THIS LESION ON AXIAL IMAGES). THE OTHER PREVIOUSLY NOTED MIXED GROUNDGLASS ATTENUATION AND SOLID LESION IN THE PERIPHERY OF THE RIGHT LOWER LOBE DEMONSTRATES A SMALLER GROUNDGLASS ATTENUATION COMPONENT MEASURING ONLY 9 X 14 MM (AXIAL IMAGE 59 OF SERIES 4) ON TODAY'S STUDY (PREVIOUSLY 12 X 13 MM), BUT AN ENLARGING SOFT TISSUE ATTENUATION COMPONENT MEASURING 6 MM IN GREATEST LENGTH (AXIAL IMAGE 60 OF SERIES 3). NO OTHER NEW SUSPICIOUS APPEARING PULMONARY NODULES OR MASSES ARE NOTED. THERE IS NO ACUTE CONSOLIDATIVE AIRSPACE DISEASE, AND NO PLEURAL EFFUSIONS. UPPER ABDOMEN: AORTIC ATHEROSCLEROSIS. STATUS POST CHOLECYSTECTOMY. MUSCULOSKELETAL: THERE ARE NO AGGRESSIVE APPEARING LYTIC OR BLASTIC LESIONS NOTED IN THE VISUALIZED PORTIONS OF THE SKELETON. IMPRESSION: 1. PREVIOUSLY NOTED GROUNDGLASS ATTENUATION NODULE IN THE LEFT UPPER LOBE IS STABLE. OTHER PREVIOUSLY NOTED MIXED SOLID AND SUB SOLID LESION IN THE PERIPHERY OF THE RIGHT LOWER LOBE HAS CHANGED SLIGHTLY, WITH THE DECREASING GROUNDGLASS ATTENUATION COMPONENT AND INCREASING SOLID COMPONENT WHICH CURRENTLY MEASURES UP TO 6 MM ALTHOUGH THE INTERVAL DECREASE IN SIZE OF THE GROUNDGLASS ATTENUATION COMPONENT IS UNUSUAL, THE ENLARGING SOLID COMPONENT TO THIS LESION IS CONCERNING FOR POTENTIAL DEVELOPING INVASIVE ADENOCARCINOMA, PARTICULARLY IN LIGHT OF THE NODULE'S  BEHAVIOR OVER THE PAST SEVERAL YEARS. GIVEN THE SMALL SIZE OF THIS NODULE, THE PET CT MAY BE OF LIMITED VALUE. AT THE VERY LEAST, CLOSE ATTENTION ON FOLLOW-UP CHEST CT IS RECOMMENDED. ALTERNATIVELY, CONSIDERATION FOR BIOPSY OR WEDGE RESECTION MAY BE WARRANTED IF CLINICALLY APPROPRIATE. 2. AORTIC ATHEROSCLEROSIS, IN ADDITION TO LEFT MAIN AND 3 VESSEL CORONARY ARTERY DISEASE. PLEASE NOTE THAT ALTHOUGH THE PRESENCE OF CORONARY ARTERY CALCIUM DOCUMENTS THE PRESENCE OF CORONARY ARTERY DISEASE, THE SEVERITY OF THIS DISEASE AND ANY POTENTIAL STENOSIS CANNOT BE ASSESSED ON THIS NON-GATED CT EXAMINATION. ASSESSMENT FOR POTENTIAL RISK FACTOR MODIFICATION, DIETARY THERAPY OR PHARMACOLOGIC THERAPY MAY BE WARRANTED, IF CLINICALLY INDICATED. THESE RESULTS WILL BE CALLED TO THE ORDERING CLINICIAN OR REPRESENTATIVE BY THE RADIOLOGIST ASST., AND COMMUNICATION DOCUMENTED IN THE PACS OR ZVISION DASHBOARD. ELECTRONICALLY SIGNED BY: Vinnie Langton M.D. ON: 06/14/2016 1253  I HAVE INDEPENDENTLY REVIEWED THE ABOVE RADIOLOGY STUDIES AND REVIEWED THE FINDINGS WITH THE PATIENT.    CT CHEST WO CONTRAST  09/22/2015 CLINICAL DATA: 46-YEAR-OLD FEMALE WITH HISTORY OF PULMONARY NODULE. FOLLOW-UP STUDY. EXAM: CT CHEST WITHOUT CONTRAST TECHNIQUE: MULTIDETECTOR CT IMAGING OF THE CHEST WAS PERFORMED FOLLOWING THE STANDARD PROTOCOL WITHOUT IV CONTRAST. COMPARISON: CHEST CT 09/16/2014. FINDINGS: MEDIASTINUM/LYMPH NODES: HEART SIZE IS NORMAL. THERE IS NO SIGNIFICANT PERICARDIAL FLUID, THICKENING OR PERICARDIAL CALCIFICATION. THERE IS ATHEROSCLEROSIS OF THE THORACIC AORTA, THE GREAT VESSELS OF THE MEDIASTINUM AND THE CORONARY ARTERIES, INCLUDING CALCIFIED ATHEROSCLEROTIC PLAQUE IN THE LEFT MAIN, LEFT ANTERIOR DESCENDING, LEFT CIRCUMFLEX AND RIGHT CORONARY ARTERIES. 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 NO PATHOLOGICALLY ENLARGED MEDIASTINAL OR HILAR LYMPH NODES. PLEASE NOTE THAT ACCURATE EXCLUSION OF  HILAR ADENOPATHY IS LIMITED ON NONCONTRAST CT SCANS. ESOPHAGUS IS UNREMARKABLE IN APPEARANCE. NO AXILLARY LYMPHADENOPATHY. LUNGS/PLEURA: THE NODULE IN THE PERIPHERY OF THE RIGHT LOWER LOBE APPEARS SLIGHTLY LARGER THAN THE PRIOR STUDY, BUT IS PREDOMINANTLY GROUNDGLASS ATTENUATION MEASURING UP TO 13 X 12 MM (IMAGE 70 OF SERIES 8) WITH ONLY A SMALL 3 MM SOLID INTERNAL COMPONENT (IMAGE 70 OF SERIES 7). PREVIOUSLY NOTED GROUNDGLASS ATTENUATION NODULE IN THE MEDIAL ASPECT OF THE LEFT  UPPER LOBE IS ALSO SLIGHTLY LARGER THAN THE PRIOR EXAMINATION MEASURING 11 X 7 MM ON TODAY'S STUDY, WITH NO CENTRAL SOLID COMPONENT. NO NEW SUSPICIOUS APPEARING PULMONARY NODULES OR MASSES ARE OTHERWISE NOTED. NO ACUTE CONSOLIDATIVE AIRSPACE DISEASE. NO PLEURAL EFFUSIONS. MILD DIFFUSE BRONCHIAL WALL THICKENING WITH MILD CENTRILOBULAR EMPHYSEMA. UPPER ABDOMEN: STATUS POST CHOLECYSTECTOMY. ATHEROSCLEROSIS. MUSCULOSKELETAL/SOFT TISSUES: THERE ARE NO AGGRESSIVE APPEARING LYTIC OR BLASTIC LESIONS NOTED IN THE VISUALIZED PORTIONS OF THE SKELETON. IMPRESSION: 1. SLOW PROGRESSIVE ENLARGEMENT OF 2 SUBSOLID LESIONS IN THE LEFT UPPER LOBE AND PERIPHERY OF THE RIGHT LOWER LOBE, AS ABOVE. THESE LESIONS REMAINS CONCERNING FOR POTENTIAL SLOW-GROWING NEOPLASM SUCH AS ADENOCARCINOMA. AT THIS TIME, CONTINUED IMAGING SURVEILLANCE IS RECOMMENDED. SPECIFICALLY, REPEAT NONCONTRAST CHEST CT IS RECOMMENDED IN 12 MONTHS. THIS RECOMMENDATION FOLLOWS THE CONSENSUS STATEMENT: GUIDELINES FOR MANAGEMENT OF INCIDENTAL PULMONARY NODULES DETECTED ON CT IMAGES: FROM THE [facility] 2017; PUBLISHED ONLINE BEFORE [facility] (10.1148/RADIOL [id number]). 2. MILD DIFFUSE BRONCHIAL WALL THICKENING WITH MILD CENTRILOBULAR EMPHYSEMA; IMAGING FINDINGS SUGGESTIVE OF UNDERLYING COPD 3. ATHEROSCLEROSIS, INCLUDING LEFT MAIN AND 3 VESSEL CORONARY ARTERY DISEASE. PLEASE NOTE THAT ALTHOUGH THE PRESENCE OF CORONARY ARTERY CALCIUM DOCUMENTS THE PRESENCE OF CORONARY ARTERY DISEASE, THE  SEVERITY OF THIS DISEASE AND ANY POTENTIAL STENOSIS CANNOT BE ASSESSED ON THIS NON-GATED CT EXAMINATION. ASSESSMENT FOR POTENTIAL RISK FACTOR MODIFICATION, DIETARY THERAPY OR PHARMACOLOGIC THERAPY MAY BE WARRANTED, IF CLINICALLY INDICATED. ELECTRONICALLY SIGNED BY: Vinnie Langton M.D. ON: 09/22/2015 903  CT CHEST WO CONTRAST  09/16/2014 CLINICAL DATA: FORMER SMOKER. FOLLOW-UP INDETERMINATE PULMONARY NODULE. EXAM: CT CHEST WITHOUT CONTRAST TECHNIQUE: MULTIDETECTOR CT IMAGING OF THE CHEST WAS PERFORMED FOLLOWING THE STANDARD PROTOCOL WITHOUT IV CONTRAST. COMPARISON: CT 03/02/2014, 01/15/2013 FINDINGS: MEDIASTINUM/NODES: NO AXILLARY OR SUPRACLAVICULAR LYMPHADENOPATHY. NO MEDIASTINAL HILAR LYMPHADENOPATHY. NO PERICARDIAL FLUID. CORONARY ARTERY CALCIFICATIONS ARE NOTED. ESOPHAGUS IS NORMAL. LUNGS/PLEURA: THE RIGHT LOWER LOBE PULMONARY NODULE OF CONCERN MEASURES 9 MM X 4 MM NOT CHANGED FROM 10 MM X 5 MM (IMAGE 28, SERIES 4). THIS LESION IS PROBABLY BEST SEEN ON THE CORONAL IMAGING MEASURING 6 MM ON IMAGE 33 COMPARED TO 6 MM ON PRIOR. A GROUNDGLASS NODULE IN THE LEFT UPPER LOBE MEASURES 8 MM (IMAGE 12, SERIES 4) UNCHANGED FROM 8 MM ON PRIOR. NO NEW PULMONARY NODULES ARE PRESENT. UPPER ABDOMEN: LIMITED VIEW OF THE LIVER, KIDNEYS, PANCREAS ARE UNREMARKABLE. NORMAL ADRENAL GLANDS. MUSCULOSKELETAL: NO AGGRESSIVE OSSEOUS LESION. IMPRESSION: 1. STABLE RIGHT LOWER LOBE PULMONARY NODULE OVER 7 MONTH INTERVAL. RECOMMEND FOLLOW-UP CT WITHOUT CONTRAST IN 12 MONTHS. 2. STABLE LEFT UPPER LOBE PULMONARY NODULE OVER GREATER THAN 3 YEARS. ELECTRONICALLY SIGNED BY: Suzy Bouchard M.D. ON: 09/16/2014 1232   CT CHEST WO CONTRAST  03/02/2014 CLINICAL DATA: FALL PULMONARY NODULES EXAM: CT CHEST WITHOUT CONTRAST TECHNIQUE: MULTIDETECTOR CT IMAGING OF THE CHEST WAS PERFORMED FOLLOWING THE STANDARD PROTOCOL WITHOUT IV CONTRAST. COMPARISON: NONE. FINDINGS: LEFT UPPER LOBE PULMONARY NODULE MEASURING 9 MM IS UNCHANGED FROM PRIOR THIS  NODULE IS STABLE OVER 3 YEARS. THE RIGHT LOWER LOBE PULMONARY NODULE HAS CHANGE IN MORPHOLOGY FROM A GROUNDGLASS NODULE TO A SOLID NODULE MEASURING 10 MM ABOUT 5 MM AND HAS AN ELONGATED MORPHOLOGY (IMAGE 34, SERIES 4). OVERALL SIZE LESION IS NOT CHANGED AS PREVIOUSLY MEASURED 15 X 9 MM NO NEW PULMONARY NODULES PRESENT. NO AXILLARY SUPRACLAVICULAR LYMPHADENOPATHY. NO MEDIASTINAL HILAR LYMPHADENOPATHY. NO PERICARDIAL FLUID. ADRENAL GLANDS ARE NORMAL. POSTCHOLECYSTECTOMY. NO AGGRESSIVE OSSEOUS LESION. IMPRESSION: 1. STABLE LEFT UPPER LOBE PULMONARY NODULE OVER 3 YEAR DISTAL BENIGN ETIOLOGY. 2. CHANGE IN MORPHOLOGY OF THE RIGHT LOWER LOBE FROM GROUNDGLASS TO SOLID NODULE. RECOMMEND FOLLOW-UP  CT WITHOUT CONTRAST IN 12 MONTHS TO REASSESS THIS CHANGING BUT NOT ENLARGING NODULE. ELECTRONICALLY SIGNED BY: Suzy Bouchard M.D. ON: 03/03/2019 1514:59  US ABDOMEN LIMITED RUQ  02/11/2014 CLINICAL DATA: HEPATOMA SURVEILLANCE, ON MEDICATION FOR HYPERTENSION EXAM: US ABDOMEN LIMITED-RIGHT UPPER QUADRANT COMPARISON: ULTRASOUND OF THE RIGHT UPPER QUADRANT OF 03/04/2013 FINDINGS: GALLBLADDER: THE GALLBLADDER HAS PREVIOUSLY BEEN RESECTED. AS NOTED ON THE PRIOR ULTRASOUND FROM 2014, A SMALL CYSTIC STRUCTURE REMAINS IN THE GALLBLADDER FOSSA OF 1.30.9 X 1.1 CM AND IS UNCHANGED, OF QUESTIONABLE SIGNIFICANCE. NO PERISTALSIS IS SEEN IN THIS REGION. COMMON BILE DUCT: DIAMETER: THE COMMON BILE DUCT IS WITHIN NORMAL LIMITS POST CHOLECYSTECTOMY MEASURING 7.7 MM IN DIAMETER. LIVER: THE LIVER HAS A NORMAL ECHOGENIC PATTERN. NO FOCAL HEPATIC ABNORMALITY IS SEEN. IMPRESSION: 1. NO FOCAL HEPATIC ABNORMALITY. 2. NO CHANGE IN A SMALL CYSTIC STRUCTURE IN THE GALLBLADDER FOSSA OF QUESTIONABLE SIGNIFICANCE. ELECTRONICALLY SIGNED BY: Ivar Drape M.D. ON: 02/11/2014 911 CT CHEST LOW DOSE PILOT WITHOUT CENTIMETERS  01/15/2013*RADIOLOGY REPORT*CLINICAL DATA: FOLLOW-UP EVALUATION OF PREVIOUSLY NOTED LUNG LESIONS. CT CHEST LOW DOSE PILOT WITHOUT CONTRAST  TECHNIQUE: MULTIDETECTOR CT IMAGING OF THE CHEST USING THE STANDARD LOW-DOSE PROTOCOL WITHOUT ADMINISTRATION OF INTRAVENOUS CONTRAST. COMPARISON: CHEST CT 11/07/2011. FINDINGS: MEDIASTINUM: HEART SIZE IS NORMAL. THERE IS NO SIGNIFICANT PERICARDIAL FLUID, THICKENING OR PERICARDIAL CALCIFICATION. THERE IS ATHEROSCLEROSIS OF THE THORACIC AORTA, THE GREAT VESSELS OF THE MEDIASTINUM AND THE CORONARY ARTERIES, INCLUDING CALCIFIED ATHEROSCLEROTIC PLAQUE IN THE LEFT MAIN, LEFT ANTERIOR DESCENDING AND RIGHT CORONARY ARTERIES. NO PATHOLOGICALLY ENLARGED MEDIASTINAL OR HILAR LYMPH NODES. PLEASE NOTE THAT ACCURATE EXCLUSION OF HILAR ADENOPATHY IS LIMITED ON NONCONTRAST CT SCANS. 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 ESOPHAGUS IS UNREMARKABLE IN APPEARANCE. LUNGS/PLEURA: THE PREVIOUSLY NOTED 4 MM RIGHT UPPER LOBE GROUND-GLASS ATTENUATION NODULE IS UNCHANGED (IMAGE 113 OF SERIES 102). IN THE MEDIAL ASPECT OF THE LEFT UPPER LOBE (IMAGE 60 OF SERIES 102) THERE IS A 12 X 8 MM ILL-DEFINED GROUNDGLASS ATTENUATION NODULE WHICH IS UNCHANGED. LIKEWISE, IN THE PERIPHERY OF THE RIGHT LOWER LOBE (IMAGE 100 OF SERIES 102) IS A 1.3 X 0.8 CM PLEURAL-BASED GROUNDGLASS ATTENUATION NODULE WHICH IS ALSO UNCHANGED. NO DEFINITE NEW SUSPICIOUS APPEARING PULMONARY NODULES OR MASSES ARE OTHERWISE IDENTIFIED. NO ACUTE CONSOLIDATIVE AIR SPACE DISEASE. NO PLEURAL EFFUSIONS. 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 UPPER ABDOMEN: STATUS POST CHOLECYSTECTOMY. MUSCULOSKELETAL: THERE ARE NO AGGRESSIVE APPEARING LYTIC OR BLASTIC LESIONS NOTED IN THE VISUALIZED PORTIONS OF THE SKELETON. IMPRESSION: 1. NO SIGNIFICANT CHANGE IN NUMEROUS GROUNDGLASS ATTENUATION NODULES SCATTERED THROUGHOUT THE LUNGS BILATERALLY, AS ABOVE. AT THIS POINT, THESE NODULES HAVE BEEN STABLE IN SIZE AND APPEARANCE COMPARED TO PRIOR STUDY FROM 12/12/2010. AN ADDITIONAL 1 YEAR FOLLOW-UP IS RECOMMENDED AT THIS TIME. THIS RECOMMENDATION  FOLLOWS THE CONSENSUS STATEMENT: RECOMMENDATIONS FOR THE MANAGEMENT OF SUBSOLID PULMONARY NODULES DETECTED AT CT: A STATEMENT FROM THE [facility] AS PUBLISHED IN RADIOLOGY 2013; [phone]. 2. MILD CENTRILOBULAR EMPHYSEMA. 3. 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 PLEASE NOTE THAT ALTHOUGH THE PRESENCE OF CORONARY ARTERY CALCIUM DOCUMENTS THE PRESENCE OF CORONARY ARTERY DISEASE, THE SEVERITY OF THIS DISEASE AND ANY POTENTIAL STENOSIS CANNOT BE ASSESSED ON THIS NON-GATED CT EXAMINATION. ASSESSMENT FOR POTENTIAL RISK FACTOR MODIFICATION, DIETARY THERAPY OR PHARMACOLOGIC THERAPY MAY BE WARRANTED, IF CLINICALLY INDICATED. 4. 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 5. STATUS POST CHOLECYSTECTOMY. ORIGINAL REPORT AUTHENTICATED BY: Vinnie Langton, M.D.  Chronic Kidney Disease   Stage I     GFR >90  Stage II    GFR 60-89  Stage IIIA GFR 45-59  Stage IIIB GFR 30-44  Stage IV   GFR 15-29  Stage V    GFR  <15  Lab Results  Component Value Date   CREATININE 1.92 (H) 06/17/2014   CrCl cannot be calculated (Patient's most recent lab result is older than the maximum 21 days allowed.).   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 THE SCAN TODAY SHOWS STABILITY OVER THE LAST 8 MONTHS. I'VE REVIEWED WITH THE PATIENT THE POTENTIAL DIAGNOSIS OF MULTIPLE SMALL CARCINOMAS. AT THIS POINT SHE IS NOT INTERESTED IN ANY FURTHER INTERVENTION OTHER THAN FOLLOW-UP CT SCAN. [place] CT SCAN IN 8 MONTHS/   2   UNCHANGED -ABERRANT RIGHT SUBCLAVIAN ARTERY (NORMAL ANATOMICAL VARIANT) INCIDENTALLY NOTED. 3 ATHEROSCLEROSIS, INCLUDING LEFT MAIN AND TWO-VESSEL CORONARY ARTERY DISEASE BY NON-GATED CT OF THE CHEST,  ASYMPTOMATIC. DISCUSSED WITH PATIENT AND RECOMMENDED IMMEDIATE IF BECOMES SYMPTOMATIC 4 HYPERLIPIDEMIA, LDL 140-WITH THE PATIENT'S HISTORY OF DIABETES, CALCIFICATION OF CORONARY ARTERIES BUT WITHOUT SYMPTOMS, AND PERSISTENT HYPERLIPIDEMIA SHE  HAS DISCUSSED WITH HER PRIMARY CARE AND ENDOCRINOLOGY TO DISCUSS ADVANTAGES AND DISADVANTAGES OF STATIN THERAPY IN HER SITUATION. 5/ CHRONIC RENAL INSUFFICIENCY- MOST RECENT CREATININE DOCUMENTED IN EPIC 1.92.  WE'LL SEE THE PATIENT IN MONTHSS WITH FOLLOW-UP CT SCAN TO EVALUATE STABILITY OF LUNG NODULEIN 8 MONTHS Grace Isaac M.D.  [address] [place] [place], [place] [address] Office [phone]   Beeper [phone]  08/27/2017 12:31 PM

## 2017-08-28 ENCOUNTER — Telehealth: Payer: Self-pay | Admitting: Cardiothoracic Surgery

## 2017-08-28 NOTE — Telephone Encounter (Signed)
Called the patient and went over the report of her recent CT scan done yesterday.  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  I discussed this with her and she is agreeable with a follow-up CT scan in 8 months.  Will make her appointment to see me at that time and have the CT scan done.  Grace Isaac MD      [address] [place] [address] Office [phone]   [place]

## 2017-10-14 ENCOUNTER — Ambulatory Visit
Admission: RE | Admit: 2017-10-14 | Discharge: 2017-10-14 | Disposition: A | Discharge status: Home / Self Care | Payer: Medicare Other | Source: Ambulatory Visit | Attending: Nurse Practitioner | Admitting: Nurse Practitioner

## 2017-10-14 DIAGNOSIS — K7460 Unspecified cirrhosis of liver: Secondary | ICD-10-CM

## 2018-02-11 ENCOUNTER — Encounter: Payer: Self-pay | Admitting: Podiatry

## 2018-02-11 ENCOUNTER — Ambulatory Visit (INDEPENDENT_AMBULATORY_CARE_PROVIDER_SITE_OTHER): Payer: Medicare Other | Admitting: Podiatry

## 2018-02-11 ENCOUNTER — Ambulatory Visit (INDEPENDENT_AMBULATORY_CARE_PROVIDER_SITE_OTHER): Payer: Medicare Other

## 2018-02-11 DIAGNOSIS — M76821 Posterior tibial tendinitis, right leg: Secondary | ICD-10-CM | POA: Diagnosis not present

## 2018-02-11 DIAGNOSIS — M76822 Posterior tibial tendinitis, left leg: Secondary | ICD-10-CM | POA: Diagnosis not present

## 2018-02-11 DIAGNOSIS — M722 Plantar fascial fibromatosis: Secondary | ICD-10-CM

## 2018-02-11 MED ORDER — MELOXICAM 15 MG PO TABS: 15.0000 mg | ORAL_TABLET | Freq: Every day | ORAL | 1 refills | Status: AC

## 2018-02-14 NOTE — Progress Notes (Signed)
    HPI: 72 year old female presenting today as a new patient with a chief complaint of intermittent tenderness and pain to the bilateral arches and dorsal midfoot that began 4 months ago. Walking, even casually, increases the pain. She has tried wearing arch pads for treatment. Patient is here for further evaluation and treatment.   Past Medical History:  Diagnosis Date  . Allergy   . Chronic kidney disease    stage 3  . Complication of anesthesia   . Diabetes mellitus   . Fibroids   . GERD (gastroesophageal reflux disease)   . Goiter   . Hepatitis    from blood transfusion, was treated in 2014  . History of blood transfusion   . History of colon polyps   . Hyperlipidemia   . Hypertension   . Osteoporosis   . Pancreatitis   . PONV (postoperative nausea and vomiting)    years ago  . Seasonal allergies        Physical Exam: General: The patient is alert and oriented x3 in no acute distress.  Dermatology: Skin is warm, dry and supple bilateral lower extremities. Negative for open lesions or macerations.  Vascular: Palpable pedal pulses bilaterally. No edema or erythema noted. Capillary refill within normal limits.  Neurological: Epicritic and protective threshold grossly intact bilaterally.   Musculoskeletal Exam: Pain on palpation noted to the posterior tibial tendon of the bilateral feet. Range of motion within normal limits. Muscle strength 5/5 in all muscle groups bilateral lower extremities.  Radiographic Exam:  Normal osseous mineralization. Joint spaces preserved. No fracture or dislocation identified.    Assessment: 1. Posterior tibial tendinitis bilateral  2. Generalized foot pain bilateral    Plan of Care:  1. Patient was evaluated. Radiographs were reviewed today. 2. Prescription for Meloxicam provided to patient.  3. Plantar fascial braces dispensed bilaterally.  4. Continue wearing good shoes.  5. Return to clinic in 6 weeks.    Edrick Kins,  DPM [facility]  Dr. Edrick Kins, [address]                                        [place], [place] [address]                Office [phone]  Fax [phone]

## 2018-03-21 ENCOUNTER — Other Ambulatory Visit: Payer: Self-pay | Admitting: Cardiothoracic Surgery

## 2018-03-21 DIAGNOSIS — R911 Solitary pulmonary nodule: Secondary | ICD-10-CM

## 2018-04-17 ENCOUNTER — Other Ambulatory Visit: Payer: Self-pay

## 2018-04-17 ENCOUNTER — Ambulatory Visit (INDEPENDENT_AMBULATORY_CARE_PROVIDER_SITE_OTHER): Payer: Medicare Other | Admitting: Cardiothoracic Surgery

## 2018-04-17 ENCOUNTER — Ambulatory Visit
Admission: RE | Admit: 2018-04-17 | Discharge: 2018-04-17 | Disposition: A | Discharge status: Home / Self Care | Payer: Medicare Other | Source: Ambulatory Visit | Attending: Cardiothoracic Surgery | Admitting: Cardiothoracic Surgery

## 2018-04-17 ENCOUNTER — Encounter: Payer: Self-pay | Admitting: Cardiothoracic Surgery

## 2018-04-17 VITALS — BP 137/67 | HR 89 | Resp 20 | Wt 157.0 lb

## 2018-04-17 DIAGNOSIS — R918 Other nonspecific abnormal finding of lung field: Secondary | ICD-10-CM | POA: Diagnosis not present

## 2018-04-17 DIAGNOSIS — R911 Solitary pulmonary nodule: Secondary | ICD-10-CM

## 2018-04-17 NOTE — Progress Notes (Signed)
[address]       [place] [address]             [phone]                    Audrey Green [facility] Medical Record #[id number] Date of Birth: [birth year]  Referring: Lucille Passy, MD  Primary Care: Lin Landsman, MD  Chief Complaint:    Chief Complaint  Patient presents with  . Lung Lesion    f/u with Chest CT today    History of Present Illness:      She has a left upper lobe and right lower lobe groundglass nodules and several other small scattered nodules in the past and followed by  Dr Arlyce Dice  . Patient returns today with a follow-up CT scan in 8 months since her previous one.  Since last seen she has had no respiratory symptoms, denies hemoptysis, denies shortness of breath , denies chest pain .  She notes she is up-to-date on her flu vaccination  She has been Followed since  2012, Slowly  progressive enlargement of 2 subsolid lesions in the left upper lobe and periphery of the right lower lobe, Cannot rule out slow-growing adenocarcinoma in groundglass opacities. This has been Discussed with patient in the past,  she was not interested in surgical intervention but is agreeable to   close follow up.      She is currently a nonsmoker,  stopped in 2013 prior she smoked for 10-12 years.   She has long-standing diabetes current hemoglobin A1c is 7.5, she does have significant hyperlipidemia with LDH 171,  Current Activity/ Functional Status:  Patient is independent with mobility/ambulation, transfers, ADL's, IADL's.  Zubrod Score: At the time of surgery this patient's most appropriate activity status/level should be described as: [x]  Normal activity, no symptoms []  Symptoms, fully ambulatory []  Symptoms, in bed less than or equal to 50% of the time []  Symptoms, in bed greater than 50% of the time but less than 100% []  Bedridden []  Moribund   Past Medical History:  Diagnosis Date  . Allergy   . Chronic kidney disease    stage 3  . Complication  of anesthesia   . Diabetes mellitus   . Fibroids   . GERD (gastroesophageal reflux disease)   . Goiter   . Hepatitis    from blood transfusion, was treated in 2014  . History of blood transfusion   . History of colon polyps   . Hyperlipidemia   . Hypertension   . Osteoporosis   . Pancreatitis   . PONV (postoperative nausea and vomiting)    years ago  . Seasonal allergies     Past Surgical History:  Procedure Laterality Date  . ABDOMINAL HYSTERECTOMY  1977  . CARPAL TUNNEL RELEASE Bilateral   . CHOLECYSTECTOMY    . COLONOSCOPY W/ POLYPECTOMY    . HAND SURGERY    . LUMBAR LAMINECTOMY/DECOMPRESSION MICRODISCECTOMY Left 06/24/2014   Procedure: Left Lumbar four-five microdiskectomy;  Surgeon: Ashok Pall, MD;  Location: [facility];  Service: Neurosurgery;  Laterality: Left;  Left Lumbar four-five microdiskectomy  . TONSILLECTOMY    . TONSILLECTOMY     Herniated disk with left leg pain - symptoms much improved  Family History  Problem Relation Age of Onset  . Breast cancer Mother   . Colon cancer Neg Hx   . Diabetes Mother   . Heart disease Mother     History  Social History  . Marital Status: Single    Spouse Name: N/A    Number of Children: 0  . Years of Education: N/A   Occupational History  . Retired- Restaurant manager, fast food    Social History Main Topics  . Smoking status: Former Smoker    Quit date: 06/11/2008  . Smokeless tobacco: Never Used  . Alcohol Use: No  . Drug Use: No  . Sexually Active: Not on file    Social History Narrative    moved here from [place] to take care of mother with Alzheimers, who passed away last week  previously worked as a Counsellor for the [employer] . She denies any work with exposure to asbestos or cotton dust , she was a smoker for more than 15 years but quit in 2013 .      1 caffeine drink daily     Social History   Tobacco Use  Smoking Status Former Smoker  . Years: 11.00  . Last attempt to  quit: 06/11/2008  . Years since quitting: 9.8  Smokeless Tobacco Never Used    Social History   Substance and Sexual Activity  Alcohol Use No     Allergies  Allergen Reactions  . Atorvastatin     Other reaction(s): Muscle Pain  . Codeine Hives  . Penicillins Hives and Swelling    Tongue and throat swelling  Has patient had a PCN reaction causing immediate rash, facial/tongue/throat swelling, SOB or lightheadedness with hypotension: Yes Has patient had a PCN reaction causing severe rash involving mucus membranes or skin necrosis: Yes Has patient had a PCN reaction that required hospitalization Yes Has patient had a PCN reaction occurring within the last 10 years: No If all of the above answers are "NO", then may proceed with Cephalosporin use.  . Sulfa Antibiotics Hives    Current Outpatient Medications  Medication Sig Dispense Refill  . alendronate (FOSAMAX) 70 MG tablet Take 70 mg by mouth every 7 (seven) days. On Wednesdays    . aspirin EC 81 MG tablet Take 81 mg by mouth daily.    . cetirizine (ZYRTEC) 10 MG tablet Take 10 mg by mouth at bedtime.     . Cholecalciferol (VITAMIN D-1000 MAX ST) 1000 units tablet Take by mouth.    . esomeprazole (NEXIUM) 40 MG capsule TAKE 1 CAPSULE (40MG  TOTAL ) BY MOUTH 2 TIMES DAILY (Patient taking differently: Take 40 mg by mouth 2 (two) times daily before a meal. ) 180 capsule 1  . fenofibrate (TRICOR) 145 MG tablet TAKE 1 TABLET (145 MG) BY ORAL ROUTE ONCE DAILY  4  . FLUAD 0.5 ML SUSY TO BE ADMINISTERED BY PHARMACIST FOR IMMUNIZATION  0  . fluticasone (FLONASE) 50 MCG/ACT nasal spray USE 2 SPRAYS IN EACH NOSTRIL DAILY (Patient taking differently: Place 2 sprays into both nostrils at bedtime. ) 48 g 1  . glucose blood (ONE TOUCH ULTRA TEST) test strip Check blood sugar twice daily and as directed. Dx 250.4 (Patient taking differently: Check blood sugar three times daily and as directed. Dx 250.4) 100 each 5  . insulin aspart (NOVOLOG FLEXPEN)  100 UNIT/ML FlexPen Inject into the skin 3 (three) times daily with meals. 10-15 units TID with meals    . Insulin Pen Needle (ULTICARE MINI PEN NEEDLES) 31G X 6 MM MISC Use to inject insulin daily 200 each 1  . isosorbide mononitrate (IMDUR) 30 MG 24 hr tablet Take 30 mg by mouth daily.  3  .  Liraglutide (VICTOZA) 18 MG/3ML SOPN Inject 1.8 mg into the skin daily.    Marland Kitchen lisinopril-hydrochlorothiazide (PRINZIDE,[name]) 20-25 MG tablet Take 1 tablet by mouth daily.  3  . Magnesium 200 MG TABS Take by mouth daily.    . Multiple Vitamin (MULITIVITAMIN WITH MINERALS) TABS Take 1 tablet by mouth daily. Centrum Silver    . ONE TOUCH LANCETS MISC Use to check blood sugar twice a day    . ranitidine (ZANTAC) 300 MG capsule Take 1 capsule (300 mg total) by mouth 2 (two) times daily. 180 capsule 1  . TRESIBA FLEXTOUCH 200 UNIT/ML SOPN INJECT 46 UNITS BY SUBCUTANEOUS ROUTE 1 TIME PER DAY EVERY EVENING  3  . insulin glargine (LANTUS SOLOSTAR) 100 UNIT/ML injection Inject 15 units twice a day. (Patient not taking: Reported on 04/17/2018) 15 pen 1   No current facility-administered medications for this visit.        Review of Systems:  Review of Systems  Constitutional: Negative.   HENT: Negative.   Eyes: Negative.   Respiratory: Negative.   Cardiovascular: Negative.   Gastrointestinal: Negative.   Genitourinary: Negative.   Musculoskeletal: Negative.   Skin: Negative.   Neurological: Negative.   Endo/Heme/Allergies: Negative.   Psychiatric/Behavioral: Negative.    PHYSICAL EXAM: BP 137/67   Pulse 89   Resp 20   Wt 157 lb (71.2 kg)   SpO2 97% Comment: RA  BMI 27.81 kg/m  General appearance: alert and cooperative Head: Normocephalic, without obvious abnormality, atraumatic Neck: no adenopathy, no carotid bruit, no JVD, supple, symmetrical, trachea midline and thyroid not enlarged, symmetric, no tenderness/mass/nodules Lymph nodes: Cervical, supraclavicular, and axillary nodes  normal. Resp: clear to auscultation bilaterally Cardio: regular rate and rhythm, S1, S2 normal, no murmur, click, rub or gallop GI: soft, non-tender; bowel sounds normal; no masses,  no organomegaly Extremities: extremities normal, atraumatic, no cyanosis or edema and Homans sign is negative, no sign of DVT Neurologic: Grossly normal  DIAGNOSTIC STUDIES & LABORATORY DATA:  RECENT RADIOLOGY FINDINGS: Ct Chest Wo Contrast  Result Date: 04/17/2018 CLINICAL DATA:  Pulmonary nodules. EXAM: CT CHEST WITHOUT CONTRAST TECHNIQUE: Multidetector CT imaging of the chest was performed following the standard protocol without IV contrast. COMPARISON:  08/27/2017.  09/16/2014.  11/07/2011. FINDINGS: Cardiovascular: The heart size is normal. No substantial pericardial effusion. Coronary artery calcification is evident. Atherosclerotic calcification is noted in the wall of the thoracic aorta. Aberrant origin right subclavian artery noted. Mediastinum/Nodes: No mediastinal lymphadenopathy. No evidence for gross hilar lymphadenopathy although assessment is limited by the lack of intravenous contrast on today's study. There is no axillary lymphadenopathy. Lungs/Pleura: The central tracheobronchial airways are patent. The bilateral ground-glass nodules are again identified, stable in the interval. Lesion described as enlarging on the most recent comparison study in the anterior right lower lobe is smaller (6 x 13 mm image 71/series 8) and now stable when comparing back to 12/20/2016. The apparent enlargement on the most recent comparison study likely related to volume averaging and slice collimation. Coronal imaging shows this elongated nodular opacity to be incorporated into a linear band of atelectasis or scarring. 8 x 11 mm ground-glass nodule medial left upper lobe (28/8) is stable. 6 mm peripheral right upper lobe ground-glass nodule is stable (53/8). Other scattered smaller ground-glass nodules are unchanged and the  very tiny right middle lobe subpleural nodule (73/8) is stable. Upper Abdomen: Gallbladder surgically absent. Musculoskeletal: No worrisome lytic or sclerotic osseous abnormality. IMPRESSION: 1. Peripheral right lower lobe sub solid nodule in question  on the most recent comparison study measures smaller today and is now similar appearance to the 12/20/2016 exam. This area is incorporated into a linear band of atelectasis or scarring in the anterior right lower lobe. When I remeasure this nodule on 09/16/2014, it is 12 mm long axis and it was measured at 13 mm long axis on the 11/07/2011 exam. 2. Next largest ground-glass nodule is in the medial left upper lobe, measuring 8 x 11 mm today. This nodule was measured at 8 x 12 mm on 11/07/2011. 3. 6 mm peripheral right upper lobe ground-glass nodule (53/8 today) was probably present but less evident on the 2013 and 2016 exams because of the thicker slice collimation used for those studies. 4. No new or progressive finding since the study of 08/27/2017. Electronically Signed   By: Misty Stanley M.D.   On: 04/17/2018 15:56    I have independently reviewed the above radiology studies  and reviewed the findings with the patient.     CT CHEST WO CONTRAST  RESULT DATE: 12/20/2016 CLINICAL DATA: FOLLOW-UP PULMONARY NODULES. EXAM: CT CHEST WITHOUT CONTRAST TECHNIQUE: MULTIDETECTOR CT IMAGING OF THE CHEST WAS PERFORMED FOLLOWING THE STANDARD PROTOCOL WITHOUT IV CONTRAST. COMPARISON: CHEST CT 06/14/2016; 03/02/2014. FINDINGS: CARDIOVASCULAR: NORMAL HEART SIZE. CORONARY ARTERIAL VASCULAR CALCIFICATIONS. AORTIC ATHEROSCLEROSIS. MEDIASTINUM/NODES: NO ENLARGED AXILLARY, MEDIASTINAL OR HILAR LYMPHADENOPATHY. THE ESOPHAGUS IS NORMAL IN APPEARANCE. LUNGS/PLEURA: CENTRAL AIRWAYS ARE PATENT. SIMILAR-APPEARING 10 X 8 MM GROUNDGLASS NODULE WITHIN THE LEFT UPPER LOBE (IMAGE 21; SERIES 4). SIMILAR APPEARING 13 X 9 MM GROUNDGLASS COMPONENT OF PULMONARY NODULE WITHIN THE RIGHT LOWER  LOBE (IMAGE 52; SERIES 4). GROSSLY UNCHANGED 6 MM SOLID COMPONENT (IMAGE 53; SERIES 4). UNCHANGED 6 MM GROUNDGLASS NODULE WITHIN THE RIGHT MIDDLE LOBE (IMAGE 39; SERIES 4). NO PLEURAL EFFUSION OR PNEUMOTHORAX. 5 MM SUBPLEURAL LEFT LOWER LOBE NODULE (IMAGE 70; SERIES 4), STABLE DATING BACK TO 2017. UPPER ABDOMEN: PATIENT STATUS POST CHOLECYSTECTOMY. NORMAL ADRENAL GLANDS. 3 MM NONOBSTRUCTING STONE INTERPOLAR REGION RIGHT KIDNEY. MUSCULOSKELETAL: NO AGGRESSIVE OR ACUTE APPEARING OSSEOUS LESIONS. IMPRESSION: WHEN COMPARED TO RECENT PRIOR EXAMINATION THERE HAS BEEN NO SIGNIFICANT INTERVAL CHANGE IN SIZE OF GROUNDGLASS NODULE WITHIN THE LEFT UPPER LOBE AS WELL AS MIXED SOLID AND GROUNDGLASS NODULE WITHIN THE RIGHT LOWER LOBE. GIVEN THE APPEARANCE OF THESE NODULES AND SLIGHT INTERVAL CHANGE OVER MULTIPLE EXAMS, SLOW GROWING ADENOCARCINOMA IS NOT EXCLUDED. AT A MINIMUM, RECOMMEND FOLLOW-UP CHEST CT IN 6 MONTHS. 5 MM SUBPLEURAL LEFT LOWER NO NODULE, STABLE DATING BACK TO 2017. RECOMMEND ATTENTION ON FOLLOW-UP. AORTIC ATHEROSCLEROSIS (ICD 10-I 70.0). ELECTRONICALLY SIGNED BY: Dian Situ DAVIS M.D. ON: 12/20/2016 946  CT CHEST WO CONTRAST  RESULT DATE: 06/14/2016 CLINICAL DATA: 89-YEAR-OLD FEMALE WITH HISTORY OF PULMONARY NODULES. ANNUAL FOLLOW-UP. EXAM: CT CHEST WITHOUT CONTRAST TECHNIQUE: MULTIDETECTOR CT IMAGING OF THE CHEST WAS PERFORMED FOLLOWING THE STANDARD PROTOCOL WITHOUT IV CONTRAST. COMPARISON: CHEST CT 09/22/2015 AND SEVERAL OTHER ARE MORE REMOTE PRIOR EXAMINATIONS. FINDINGS: NOTE: ON TODAY'S STUDY, THE PATIENT APPEARS TO HAVE BEEN PROPPED UPWARD ON THE CT SCANNER TABLE, RESULTING IN SLIGHTLY DIFFERENT SAMPLING OF THE PREVIOUSLY NOTED PULMONARY NODULES ON ANY GIVEN AXIAL IMAGE. CARDIOVASCULAR: HEART SIZE IS NORMAL. THERE IS NO SIGNIFICANT PERICARDIAL FLUID, THICKENING OR PERICARDIAL CALCIFICATION. THERE IS AORTIC ATHEROSCLEROSIS, AS WELL AS ATHEROSCLEROSIS OF THE GREAT VESSELS OF THE MEDIASTINUM AND THE CORONARY  ARTERIES, INCLUDING CALCIFIED ATHEROSCLEROTIC PLAQUE IN THE LEFT MAIN, LEFT ANTERIOR DESCENDING, LEFT CIRCUMFLEX AND RIGHT CORONARY ARTERIES. 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 MEDIASTINUM/NODES: NO PATHOLOGICALLY ENLARGED MEDIASTINAL OR HILAR LYMPH  NODES. PLEASE NOTE THAT ACCURATE EXCLUSION OF HILAR ADENOPATHY IS LIMITED ON NONCONTRAST CT SCANS. ESOPHAGUS IS UNREMARKABLE IN APPEARANCE. NO AXILLARY LYMPHADENOPATHY. LUNGS/PLEURA: PREVIOUSLY NOTED GROUNDGLASS ATTENUATION NODULE IN THE MEDIAL ASPECT OF THE LEFT UPPER LOBE IS STABLE IN SIZE MEASURING 11 X 8 MM (MEASUREMENTS WERE MADE ON IMAGES 22 AND 23 OF SERIES 4 TO DIRECTLY COMPARE WITH THE PRIOR STUDY AND COMPENSATE FOR SLIGHT DIFFERENCE IN SAMPLING OF THIS LESION ON AXIAL IMAGES). THE OTHER PREVIOUSLY NOTED MIXED GROUNDGLASS ATTENUATION AND SOLID LESION IN THE PERIPHERY OF THE RIGHT LOWER LOBE DEMONSTRATES A SMALLER GROUNDGLASS ATTENUATION COMPONENT MEASURING ONLY 9 X 14 MM (AXIAL IMAGE 59 OF SERIES 4) ON TODAY'S STUDY (PREVIOUSLY 12 X 13 MM), BUT AN ENLARGING SOFT TISSUE ATTENUATION COMPONENT MEASURING 6 MM IN GREATEST LENGTH (AXIAL IMAGE 60 OF SERIES 3). NO OTHER NEW SUSPICIOUS APPEARING PULMONARY NODULES OR MASSES ARE NOTED. THERE IS NO ACUTE CONSOLIDATIVE AIRSPACE DISEASE, AND NO PLEURAL EFFUSIONS. UPPER ABDOMEN: AORTIC ATHEROSCLEROSIS. STATUS POST CHOLECYSTECTOMY. MUSCULOSKELETAL: THERE ARE NO AGGRESSIVE APPEARING LYTIC OR BLASTIC LESIONS NOTED IN THE VISUALIZED PORTIONS OF THE SKELETON. IMPRESSION: 1. PREVIOUSLY NOTED GROUNDGLASS ATTENUATION NODULE IN THE LEFT UPPER LOBE IS STABLE. OTHER PREVIOUSLY NOTED MIXED SOLID AND SUB SOLID LESION IN THE PERIPHERY OF THE RIGHT LOWER LOBE HAS CHANGED SLIGHTLY, WITH THE DECREASING GROUNDGLASS ATTENUATION COMPONENT AND INCREASING SOLID COMPONENT WHICH CURRENTLY MEASURES UP TO 6 MM ALTHOUGH THE INTERVAL DECREASE IN SIZE OF THE GROUNDGLASS ATTENUATION COMPONENT IS UNUSUAL, THE  ENLARGING SOLID COMPONENT TO THIS LESION IS CONCERNING FOR POTENTIAL DEVELOPING INVASIVE ADENOCARCINOMA, PARTICULARLY IN LIGHT OF THE NODULE'S BEHAVIOR OVER THE PAST SEVERAL YEARS. GIVEN THE SMALL SIZE OF THIS NODULE, THE PET CT MAY BE OF LIMITED VALUE. AT THE VERY LEAST, CLOSE ATTENTION ON FOLLOW-UP CHEST CT IS RECOMMENDED. ALTERNATIVELY, CONSIDERATION FOR BIOPSY OR WEDGE RESECTION MAY BE WARRANTED IF CLINICALLY APPROPRIATE. 2. AORTIC ATHEROSCLEROSIS, IN ADDITION TO LEFT MAIN AND 3 VESSEL CORONARY ARTERY DISEASE. PLEASE NOTE THAT ALTHOUGH THE PRESENCE OF CORONARY ARTERY CALCIUM DOCUMENTS THE PRESENCE OF CORONARY ARTERY DISEASE, THE SEVERITY OF THIS DISEASE AND ANY POTENTIAL STENOSIS CANNOT BE ASSESSED ON THIS NON-GATED CT EXAMINATION. ASSESSMENT FOR POTENTIAL RISK FACTOR MODIFICATION, DIETARY THERAPY OR PHARMACOLOGIC THERAPY MAY BE WARRANTED, IF CLINICALLY INDICATED. THESE RESULTS WILL BE CALLED TO THE ORDERING CLINICIAN OR REPRESENTATIVE BY THE RADIOLOGIST ASST., AND COMMUNICATION DOCUMENTED IN THE PACS OR ZVISION DASHBOARD. ELECTRONICALLY SIGNED BY: Vinnie Langton M.D. ON: 06/14/2016 1253  I HAVE INDEPENDENTLY REVIEWED THE ABOVE RADIOLOGY STUDIES AND REVIEWED THE FINDINGS WITH THE PATIENT.    CT CHEST WO CONTRAST  09/22/2015 CLINICAL DATA: 89-YEAR-OLD FEMALE WITH HISTORY OF PULMONARY NODULE. FOLLOW-UP STUDY. EXAM: CT CHEST WITHOUT CONTRAST TECHNIQUE: MULTIDETECTOR CT IMAGING OF THE CHEST WAS PERFORMED FOLLOWING THE STANDARD PROTOCOL WITHOUT IV CONTRAST. COMPARISON: CHEST CT 09/16/2014. FINDINGS: MEDIASTINUM/LYMPH NODES: HEART SIZE IS NORMAL. THERE IS NO SIGNIFICANT PERICARDIAL FLUID, THICKENING OR PERICARDIAL CALCIFICATION. THERE IS ATHEROSCLEROSIS OF THE THORACIC AORTA, THE GREAT VESSELS OF THE MEDIASTINUM AND THE CORONARY ARTERIES, INCLUDING CALCIFIED ATHEROSCLEROTIC PLAQUE IN THE LEFT MAIN, LEFT ANTERIOR DESCENDING, LEFT CIRCUMFLEX AND RIGHT CORONARY ARTERIES. ABERRANT RIGHT SUBCLAVIAN ARTERY (NORMAL  ANATOMICAL VARIANT) INCIDENTALLY NOTED. NO PATHOLOGICALLY ENLARGED MEDIASTINAL OR HILAR LYMPH NODES. PLEASE NOTE THAT ACCURATE EXCLUSION OF HILAR ADENOPATHY IS LIMITED ON NONCONTRAST CT SCANS. ESOPHAGUS IS UNREMARKABLE IN APPEARANCE. NO AXILLARY LYMPHADENOPATHY. LUNGS/PLEURA: THE NODULE IN THE PERIPHERY OF THE RIGHT LOWER LOBE APPEARS SLIGHTLY LARGER THAN THE PRIOR STUDY, BUT IS PREDOMINANTLY GROUNDGLASS ATTENUATION MEASURING UP TO 13 X 12 MM (IMAGE  70 OF SERIES 8) WITH ONLY A SMALL 3 MM SOLID INTERNAL COMPONENT (IMAGE 70 OF SERIES 7). PREVIOUSLY NOTED GROUNDGLASS ATTENUATION NODULE IN THE MEDIAL ASPECT OF THE LEFT UPPER LOBE IS ALSO SLIGHTLY LARGER THAN THE PRIOR EXAMINATION MEASURING 11 X 7 MM ON TODAY'S STUDY, WITH NO CENTRAL SOLID COMPONENT. NO NEW SUSPICIOUS APPEARING PULMONARY NODULES OR MASSES ARE OTHERWISE NOTED. NO ACUTE CONSOLIDATIVE AIRSPACE DISEASE. NO PLEURAL EFFUSIONS. MILD DIFFUSE BRONCHIAL WALL THICKENING WITH MILD CENTRILOBULAR EMPHYSEMA. UPPER ABDOMEN: STATUS POST CHOLECYSTECTOMY. ATHEROSCLEROSIS. MUSCULOSKELETAL/SOFT TISSUES: THERE ARE NO AGGRESSIVE APPEARING LYTIC OR BLASTIC LESIONS NOTED IN THE VISUALIZED PORTIONS OF THE SKELETON. IMPRESSION: 1. SLOW PROGRESSIVE ENLARGEMENT OF 2 SUBSOLID LESIONS IN THE LEFT UPPER LOBE AND PERIPHERY OF THE RIGHT LOWER LOBE, AS ABOVE. THESE LESIONS REMAINS CONCERNING FOR POTENTIAL SLOW-GROWING NEOPLASM SUCH AS ADENOCARCINOMA. AT THIS TIME, CONTINUED IMAGING SURVEILLANCE IS RECOMMENDED. SPECIFICALLY, REPEAT NONCONTRAST CHEST CT IS RECOMMENDED IN 12 MONTHS. THIS RECOMMENDATION FOLLOWS THE CONSENSUS STATEMENT: GUIDELINES FOR MANAGEMENT OF INCIDENTAL PULMONARY NODULES DETECTED ON CT IMAGES: FROM THE [place] 2017; PUBLISHED ONLINE BEFORE [facility] ([phone]/RADIOL [id number]). 2. MILD DIFFUSE BRONCHIAL WALL THICKENING WITH MILD CENTRILOBULAR EMPHYSEMA; IMAGING FINDINGS SUGGESTIVE OF UNDERLYING COPD 3. ATHEROSCLEROSIS, INCLUDING LEFT MAIN AND 3 VESSEL CORONARY  ARTERY DISEASE. PLEASE NOTE THAT ALTHOUGH THE PRESENCE OF CORONARY ARTERY CALCIUM DOCUMENTS THE PRESENCE OF CORONARY ARTERY DISEASE, THE SEVERITY OF THIS DISEASE AND ANY POTENTIAL STENOSIS CANNOT BE ASSESSED ON THIS NON-GATED CT EXAMINATION. ASSESSMENT FOR POTENTIAL RISK FACTOR MODIFICATION, DIETARY THERAPY OR PHARMACOLOGIC THERAPY MAY BE WARRANTED, IF CLINICALLY INDICATED. ELECTRONICALLY SIGNED BY: Vinnie Langton M.D. ON: 09/22/2015 903  CT CHEST WO CONTRAST  09/16/2014 CLINICAL DATA: FORMER SMOKER. FOLLOW-UP INDETERMINATE PULMONARY NODULE. EXAM: CT CHEST WITHOUT CONTRAST TECHNIQUE: MULTIDETECTOR CT IMAGING OF THE CHEST WAS PERFORMED FOLLOWING THE STANDARD PROTOCOL WITHOUT IV CONTRAST. COMPARISON: CT 03/02/2014, 01/15/2013 FINDINGS: MEDIASTINUM/NODES: NO AXILLARY OR SUPRACLAVICULAR LYMPHADENOPATHY. NO MEDIASTINAL HILAR LYMPHADENOPATHY. NO PERICARDIAL FLUID. CORONARY ARTERY CALCIFICATIONS ARE NOTED. ESOPHAGUS IS NORMAL. LUNGS/PLEURA: THE RIGHT LOWER LOBE PULMONARY NODULE OF CONCERN MEASURES 9 MM X 4 MM NOT CHANGED FROM 10 MM X 5 MM (IMAGE 28, SERIES 4). THIS LESION IS PROBABLY BEST SEEN ON THE CORONAL IMAGING MEASURING 6 MM ON IMAGE 58 COMPARED TO 6 MM ON PRIOR. A GROUNDGLASS NODULE IN THE LEFT UPPER LOBE MEASURES 8 MM (IMAGE 12, SERIES 4) UNCHANGED FROM 8 MM ON PRIOR. NO NEW PULMONARY NODULES ARE PRESENT. UPPER ABDOMEN: LIMITED VIEW OF THE LIVER, KIDNEYS, PANCREAS ARE UNREMARKABLE. NORMAL ADRENAL GLANDS. MUSCULOSKELETAL: NO AGGRESSIVE OSSEOUS LESION. IMPRESSION: 1. STABLE RIGHT LOWER LOBE PULMONARY NODULE OVER 7 MONTH INTERVAL. RECOMMEND FOLLOW-UP CT WITHOUT CONTRAST IN 12 MONTHS. 2. STABLE LEFT UPPER LOBE PULMONARY NODULE OVER GREATER THAN 3 YEARS. ELECTRONICALLY SIGNED BY: Suzy Bouchard M.D. ON: 09/16/2014 1232   CT CHEST WO CONTRAST  03/02/2014 CLINICAL DATA: FALL PULMONARY NODULES EXAM: CT CHEST WITHOUT CONTRAST TECHNIQUE: MULTIDETECTOR CT IMAGING OF THE CHEST WAS PERFORMED FOLLOWING THE STANDARD  PROTOCOL WITHOUT IV CONTRAST. COMPARISON: NONE. FINDINGS: LEFT UPPER LOBE PULMONARY NODULE MEASURING 9 MM IS UNCHANGED FROM PRIOR THIS NODULE IS STABLE OVER 3 YEARS. THE RIGHT LOWER LOBE PULMONARY NODULE HAS CHANGE IN MORPHOLOGY FROM A GROUNDGLASS NODULE TO A SOLID NODULE MEASURING 10 MM ABOUT 5 MM AND HAS AN ELONGATED MORPHOLOGY (IMAGE 34, SERIES 4). OVERALL SIZE LESION IS NOT CHANGED AS PREVIOUSLY MEASURED 15 X 9 MM NO NEW PULMONARY NODULES PRESENT. NO AXILLARY SUPRACLAVICULAR LYMPHADENOPATHY. NO MEDIASTINAL HILAR LYMPHADENOPATHY. NO PERICARDIAL FLUID. ADRENAL GLANDS ARE NORMAL. POSTCHOLECYSTECTOMY. NO AGGRESSIVE OSSEOUS LESION.  IMPRESSION: 1. STABLE LEFT UPPER LOBE PULMONARY NODULE OVER 3 YEAR DISTAL BENIGN ETIOLOGY. 2. CHANGE IN MORPHOLOGY OF THE RIGHT LOWER LOBE FROM GROUNDGLASS TO SOLID NODULE. RECOMMEND FOLLOW-UP CT WITHOUT CONTRAST IN 12 MONTHS TO REASSESS THIS CHANGING BUT NOT ENLARGING NODULE. ELECTRONICALLY SIGNED BY: Suzy Bouchard M.D. ON: 03/03/2019 1514:59  US ABDOMEN LIMITED RUQ  02/11/2014 CLINICAL DATA: HEPATOMA SURVEILLANCE, ON MEDICATION FOR HYPERTENSION EXAM: US ABDOMEN LIMITED-RIGHT UPPER QUADRANT COMPARISON: ULTRASOUND OF THE RIGHT UPPER QUADRANT OF 03/04/2013 FINDINGS: GALLBLADDER: THE GALLBLADDER HAS PREVIOUSLY BEEN RESECTED. AS NOTED ON THE PRIOR ULTRASOUND FROM 2014, A SMALL CYSTIC STRUCTURE REMAINS IN THE GALLBLADDER FOSSA OF 1.30.9 X 1.1 CM AND IS UNCHANGED, OF QUESTIONABLE SIGNIFICANCE. NO PERISTALSIS IS SEEN IN THIS REGION. COMMON BILE DUCT: DIAMETER: THE COMMON BILE DUCT IS WITHIN NORMAL LIMITS POST CHOLECYSTECTOMY MEASURING 7.7 MM IN DIAMETER. LIVER: THE LIVER HAS A NORMAL ECHOGENIC PATTERN. NO FOCAL HEPATIC ABNORMALITY IS SEEN. IMPRESSION: 1. NO FOCAL HEPATIC ABNORMALITY. 2. NO CHANGE IN A SMALL CYSTIC STRUCTURE IN THE GALLBLADDER FOSSA OF QUESTIONABLE SIGNIFICANCE. ELECTRONICALLY SIGNED BY: Ivar Drape M.D. ON: 02/11/2014 911 CT CHEST LOW DOSE PILOT WITHOUT  CENTIMETERS  01/15/2013*RADIOLOGY REPORT*CLINICAL DATA: FOLLOW-UP EVALUATION OF PREVIOUSLY NOTED LUNG LESIONS. CT CHEST LOW DOSE PILOT WITHOUT CONTRAST TECHNIQUE: MULTIDETECTOR CT IMAGING OF THE CHEST USING THE STANDARD LOW-DOSE PROTOCOL WITHOUT ADMINISTRATION OF INTRAVENOUS CONTRAST. COMPARISON: CHEST CT 11/07/2011. FINDINGS: MEDIASTINUM: HEART SIZE IS NORMAL. THERE IS NO SIGNIFICANT PERICARDIAL FLUID, THICKENING OR PERICARDIAL CALCIFICATION. THERE IS ATHEROSCLEROSIS OF THE THORACIC AORTA, THE GREAT VESSELS OF THE MEDIASTINUM AND THE CORONARY ARTERIES, INCLUDING CALCIFIED ATHEROSCLEROTIC PLAQUE IN THE LEFT MAIN, LEFT ANTERIOR DESCENDING AND RIGHT CORONARY ARTERIES. NO PATHOLOGICALLY ENLARGED MEDIASTINAL OR HILAR LYMPH NODES. PLEASE NOTE THAT ACCURATE EXCLUSION OF HILAR ADENOPATHY IS LIMITED ON NONCONTRAST CT SCANS. 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 ESOPHAGUS IS UNREMARKABLE IN APPEARANCE. LUNGS/PLEURA: THE PREVIOUSLY NOTED 4 MM RIGHT UPPER LOBE GROUND-GLASS ATTENUATION NODULE IS UNCHANGED (IMAGE 113 OF SERIES 102). IN THE MEDIAL ASPECT OF THE LEFT UPPER LOBE (IMAGE 60 OF SERIES 102) THERE IS A 12 X 8 MM ILL-DEFINED GROUNDGLASS ATTENUATION NODULE WHICH IS UNCHANGED. LIKEWISE, IN THE PERIPHERY OF THE RIGHT LOWER LOBE (IMAGE 100 OF SERIES 102) IS A 1.3 X 0.8 CM PLEURAL-BASED GROUNDGLASS ATTENUATION NODULE WHICH IS ALSO UNCHANGED. NO DEFINITE NEW SUSPICIOUS APPEARING PULMONARY NODULES OR MASSES ARE OTHERWISE IDENTIFIED. NO ACUTE CONSOLIDATIVE AIR SPACE DISEASE. NO PLEURAL EFFUSIONS. 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 UPPER ABDOMEN: STATUS POST CHOLECYSTECTOMY. MUSCULOSKELETAL: THERE ARE NO AGGRESSIVE APPEARING LYTIC OR BLASTIC LESIONS NOTED IN THE VISUALIZED PORTIONS OF THE SKELETON. IMPRESSION: 1. NO SIGNIFICANT CHANGE IN NUMEROUS GROUNDGLASS ATTENUATION NODULES SCATTERED THROUGHOUT THE LUNGS BILATERALLY, AS ABOVE. AT THIS POINT, THESE NODULES HAVE  BEEN STABLE IN SIZE AND APPEARANCE COMPARED TO PRIOR STUDY FROM 12/12/2010. AN ADDITIONAL 1 YEAR FOLLOW-UP IS RECOMMENDED AT THIS TIME. THIS RECOMMENDATION FOLLOWS THE CONSENSUS STATEMENT: RECOMMENDATIONS FOR THE MANAGEMENT OF SUBSOLID PULMONARY NODULES DETECTED AT CT: A STATEMENT FROM THE [place] AS PUBLISHED IN RADIOLOGY 2013; [id number]. 2. MILD CENTRILOBULAR EMPHYSEMA. 3. 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 PLEASE NOTE THAT ALTHOUGH THE PRESENCE OF CORONARY ARTERY CALCIUM DOCUMENTS THE PRESENCE OF CORONARY ARTERY DISEASE, THE SEVERITY OF THIS DISEASE AND ANY POTENTIAL STENOSIS CANNOT BE ASSESSED ON THIS NON-GATED CT EXAMINATION. ASSESSMENT FOR POTENTIAL RISK FACTOR MODIFICATION, DIETARY THERAPY OR PHARMACOLOGIC THERAPY MAY BE WARRANTED, IF CLINICALLY INDICATED. 4. 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 5. STATUS POST  CHOLECYSTECTOMY. ORIGINAL REPORT AUTHENTICATED BY: Vinnie Langton, M.D.  Chronic Kidney Disease   Stage I     GFR >90  Stage II    GFR 60-89  Stage IIIA GFR 45-59  Stage IIIB GFR 30-44  Stage IV   GFR 15-29  Stage V    GFR  <15  Lab Results  Component Value Date   CREATININE 1.92 (H) 06/17/2014   CrCl cannot be calculated (Patient's most recent lab result is older than the maximum 21 days allowed.).   ASSESSMENT/PLAN: #1 no new pulmonary nodules noted on follow-up CT scan, previous nodules noted remains stable if not slightly decreased in size.  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  Since they have been stable over the past 7 to 8 years we will continue to follow them.  Plan follow-up CT scan in 1 year #2 apparent right subclavian artery incidentally noted #3 patient does have evidence of coronary artery calcification, she has no cardiac symptoms no angina or evidence of  congestive heart failure #4 chronic renal insufficiency   Plan see the patient back in 1 year with a follow-up CT of the chest  Grace Isaac M.D.  [address] [place], [place] [address] Office [phone]   Beeper [phone]  04/17/2018 4:12 PM

## 2018-04-29 ENCOUNTER — Encounter: Payer: Self-pay | Admitting: Podiatry

## 2018-04-29 ENCOUNTER — Ambulatory Visit (INDEPENDENT_AMBULATORY_CARE_PROVIDER_SITE_OTHER): Payer: Medicare Other | Admitting: Podiatry

## 2018-04-29 DIAGNOSIS — M76821 Posterior tibial tendinitis, right leg: Secondary | ICD-10-CM | POA: Diagnosis not present

## 2018-04-29 DIAGNOSIS — M76822 Posterior tibial tendinitis, left leg: Secondary | ICD-10-CM

## 2018-04-29 MED ORDER — MELOXICAM 15 MG PO TABS: 15.0000 mg | ORAL_TABLET | Freq: Every day | ORAL | 1 refills | Status: AC

## 2018-05-01 ENCOUNTER — Ambulatory Visit: Payer: BLUE CROSS/BLUE SHIELD | Admitting: Cardiothoracic Surgery

## 2018-05-01 NOTE — Progress Notes (Signed)
    HPI: 72 year old female presenting today for follow up evaluation of PT tendinitis and generalized foot pain bilaterally. She states she is doing much better and reports a significant improvement in her symptoms. There are no modifying factors noted. She has been taking Meloxicam and using her plantar fascial braces as directed. She is interested in new inserts. Patient is here for further evaluation and treatment.   Past Medical History:  Diagnosis Date  . Allergy   . Chronic kidney disease    stage 3  . Complication of anesthesia   . Diabetes mellitus   . Fibroids   . GERD (gastroesophageal reflux disease)   . Goiter   . Hepatitis    from blood transfusion, was treated in 2014  . History of blood transfusion   . History of colon polyps   . Hyperlipidemia   . Hypertension   . Osteoporosis   . Pancreatitis   . PONV (postoperative nausea and vomiting)    years ago  . Seasonal allergies        Physical Exam: General: The patient is alert and oriented x3 in no acute distress.  Dermatology: Skin is warm, dry and supple bilateral lower extremities. Negative for open lesions or macerations.  Vascular: Palpable pedal pulses bilaterally. No edema or erythema noted. Capillary refill within normal limits.  Neurological: Epicritic and protective threshold grossly intact bilaterally.   Musculoskeletal Exam: Pain on palpation noted to the posterior tibial tendon of the bilateral feet. Range of motion within normal limits. Muscle strength 5/5 in all muscle groups bilateral lower extremities.  Assessment: 1. Posterior tibial tendinitis bilateral - improved 2. Generalized foot pain bilateral - improved    Plan of Care:  1. Patient was evaluated.  2. Continue using plantar fascial braces and taking Meloxicam.  3. Refill prescription for Meloxicam provided to patient.  4. Recommended good shoe gear.  5. Pre-authorization requested for custom orthotics.  6. Return to clinic in 8  weeks.     Edrick Kins, DPM [facility]  Dr. Edrick Kins, [address]                                        [place], [place] [address]                Office [phone]  Fax [phone]

## 2018-05-06 ENCOUNTER — Telehealth: Payer: Self-pay | Admitting: Podiatry

## 2018-05-06 NOTE — Telephone Encounter (Signed)
Called home number a couple of times and it sounds like someone picks up and hangs up.. Lvm for pt to cell phone to call to discuss orthotic coverage. Pt does have medicare.

## 2018-06-23 ENCOUNTER — Other Ambulatory Visit: Payer: Self-pay

## 2018-06-23 MED ORDER — MELOXICAM 15 MG PO TABS: 15.0000 mg | ORAL_TABLET | Freq: Every day | ORAL | 1 refills | Status: DC

## 2018-06-23 NOTE — Telephone Encounter (Signed)
Pharmacy refill request for Meloxicam 15mg    Per Dr. Amalia Hailey verbal order, its ok to refill.   Script has been sent to pharmacy

## 2018-06-24 ENCOUNTER — Ambulatory Visit: Payer: Medicare Other | Admitting: Podiatry

## 2018-08-19 ENCOUNTER — Ambulatory Visit: Payer: Medicare Other | Admitting: Podiatry

## 2018-10-13 ENCOUNTER — Other Ambulatory Visit: Payer: Self-pay | Admitting: Podiatry

## 2018-11-24 ENCOUNTER — Other Ambulatory Visit: Payer: Self-pay | Admitting: Nurse Practitioner

## 2018-11-24 DIAGNOSIS — K7400 Hepatic fibrosis, unspecified: Secondary | ICD-10-CM

## 2019-02-04 NOTE — Telephone Encounter (Signed)
Error

## 2019-03-27 ENCOUNTER — Other Ambulatory Visit: Payer: Self-pay | Admitting: Cardiothoracic Surgery

## 2019-03-27 DIAGNOSIS — R911 Solitary pulmonary nodule: Secondary | ICD-10-CM

## 2019-04-23 ENCOUNTER — Ambulatory Visit
Admission: RE | Admit: 2019-04-23 | Discharge: 2019-04-23 | Disposition: A | Discharge status: Home / Self Care | Payer: Medicare Other | Source: Ambulatory Visit | Attending: Cardiothoracic Surgery | Admitting: Cardiothoracic Surgery

## 2019-04-23 ENCOUNTER — Other Ambulatory Visit: Payer: Self-pay

## 2019-04-23 ENCOUNTER — Ambulatory Visit (INDEPENDENT_AMBULATORY_CARE_PROVIDER_SITE_OTHER): Payer: Medicare Other | Admitting: Cardiothoracic Surgery

## 2019-04-23 VITALS — BP 137/81 | HR 83 | Temp 97.5°F | Resp 20 | Ht 63.0 in | Wt 157.4 lb

## 2019-04-23 DIAGNOSIS — R911 Solitary pulmonary nodule: Secondary | ICD-10-CM

## 2019-04-23 DIAGNOSIS — R918 Other nonspecific abnormal finding of lung field: Secondary | ICD-10-CM | POA: Diagnosis not present

## 2019-04-23 NOTE — Progress Notes (Signed)
[address]       [place] [address]             [phone]                    [facility] Medical Record [id number] Date of Birth: [birth year]  Referring: Lucille Passy, MD  Primary Care: Lin Landsman, MD  Chief Complaint:    Chief Complaint  Patient presents with   Lung Lesion    f/u w/ CT scan today    History of Present Illness:    Patient returns today for follow-up CT scan of the chest, she had some difficulty getting here today due to [facility] closed inflating.   She has had a  left upper lobe and right lower lobe groundglass nodules and several other small scattered nodules in the past and followed by  Dr Arlyce Dice  .  She has had no respiratory symptoms, denies hemoptysis, denies shortness of breath , denies chest pain .  She notes she is up-to-date on her flu vaccination  She has been Followed since  2012, Slowly  progressive enlargement of 2 subsolid lesions in the left upper lobe and periphery of the right lower lobe, Cannot rule out slow-growing adenocarcinoma in groundglass opacities. This has been Discussed with patient in the past,  she was not interested in surgical intervention but is agreeable to   close follow up.      She is currently a nonsmoker,  stopped in 2013 prior she smoked for 10-12 years.   She has long-standing diabetes current hemoglobin A1c is 7.5, she does have significant hyperlipidemia with LDH 171,  Current Activity/ Functional Status:  Patient is independent with mobility/ambulation, transfers, ADL's, IADL's.  Zubrod Score: At the time of surgery this patients most appropriate activity status/level should be described as: [x]  Normal activity, no symptoms []  Symptoms, fully ambulatory []  Symptoms, in bed less than or equal to 50% of the time []  Symptoms, in bed greater than 50% of the time but less than 100% []  Bedridden []  Moribund   Past Medical History:  Diagnosis Date   Allergy    Chronic kidney  disease    stage 3   Complication of anesthesia    Diabetes mellitus    Fibroids    GERD (gastroesophageal reflux disease)    Goiter    Hepatitis    from blood transfusion, was treated in 2014   History of blood transfusion    History of colon polyps    Hyperlipidemia    Hypertension    Osteoporosis    Pancreatitis    PONV (postoperative nausea and vomiting)    years ago   Seasonal allergies     Past Surgical History:  Procedure Laterality Date   ABDOMINAL HYSTERECTOMY  1977   CARPAL TUNNEL RELEASE Bilateral    CHOLECYSTECTOMY     COLONOSCOPY W/ POLYPECTOMY     HAND SURGERY     LUMBAR LAMINECTOMY/DECOMPRESSION MICRODISCECTOMY Left 06/24/2014   Procedure: Left Lumbar four-five microdiskectomy;  Surgeon: Ashok Pall, MD;  Location: [facility];  Service: Neurosurgery;  Laterality: Left;  Left Lumbar four-five microdiskectomy   TONSILLECTOMY     TONSILLECTOMY     Herniated disk with left leg pain - symptoms much improved  Family History  Problem Relation Age of Onset   Breast cancer Mother    Diabetes Mother    Heart disease Mother    Colon cancer Neg  Hx     History   Social History   Marital Status: Single    Spouse Name: N/A    Number of Children: 0   Years of Education: N/A   Occupational History   Retired- Restaurant manager, fast food    Social History Main Topics   Smoking status: Former Smoker    Quit date: 06/11/2008   Smokeless tobacco: Never Used   Alcohol Use: No   Drug Use: No   Sexually Active: Not on file    Social History Narrative    moved here from [place] to take care of mother with Alzheimers, who passed away last week  previously worked as a Counsellor for the [employer] . She denies any work with exposure to asbestos or cotton dust , she was a smoker for more than 15 years but quit in 2013 .      1 caffeine drink daily     Social History   Tobacco Use  Smoking Status Former Smoker    Years: 11.00   Quit date: 06/11/2008   Years since quitting: 10.8  Smokeless Tobacco Never Used    Social History   Substance and Sexual Activity  Alcohol Use No     Allergies  Allergen Reactions   Atorvastatin     Other reaction(s): Muscle Pain   Codeine Hives   Penicillins Hives and Swelling    Tongue and throat swelling  Has patient had a PCN reaction causing immediate rash, facial/tongue/throat swelling, SOB or lightheadedness with hypotension: Yes Has patient had a PCN reaction causing severe rash involving mucus membranes or skin necrosis: Yes Has patient had a PCN reaction that required hospitalization Yes Has patient had a PCN reaction occurring within the last 10 years: No If all of the above answers are "NO", then may proceed with Cephalosporin use.   Sulfa Antibiotics Hives    Current Outpatient Medications  Medication Sig Dispense Refill   alendronate (FOSAMAX) 70 MG tablet Take 70 mg by mouth every 7 (seven) days. On Wednesdays     aspirin EC 81 MG tablet Take 81 mg by mouth daily.     cetirizine (ZYRTEC) 10 MG tablet Take 10 mg by mouth at bedtime.      Cholecalciferol (VITAMIN D-1000 MAX ST) 1000 units tablet Take by mouth.     esomeprazole (NEXIUM) 40 MG capsule TAKE 1 CAPSULE (40MG  TOTAL ) BY MOUTH 2 TIMES DAILY (Patient taking differently: Take 40 mg by mouth 2 (two) times daily before a meal. ) 180 capsule 1   fenofibrate (TRICOR) 145 MG tablet TAKE 1 TABLET (145 MG) BY ORAL ROUTE ONCE DAILY  4   FLUAD 0.5 ML SUSY TO BE ADMINISTERED BY PHARMACIST FOR IMMUNIZATION  0   fluticasone (FLONASE) 50 MCG/ACT nasal spray USE 2 SPRAYS IN EACH NOSTRIL DAILY (Patient taking differently: Place 2 sprays into both nostrils at bedtime. ) 48 g 1   glucose blood (ONE TOUCH ULTRA TEST) test strip Check blood sugar twice daily and as directed. Dx 250.4 (Patient taking differently: Check blood sugar three times daily and as directed. Dx 250.4) 100 each 5    hydrOXYzine (ATARAX/VISTARIL) 25 MG tablet Take 25 mg by mouth 4 (four) times daily. Started 4 months ago     insulin aspart (NOVOLOG FLEXPEN) 100 UNIT/ML FlexPen Inject into the skin 3 (three) times daily with meals. 10-15 units TID with meals     insulin glargine (LANTUS SOLOSTAR) 100 UNIT/ML injection Inject 15 units twice  a day. 15 pen 1   Insulin Pen Needle (ULTICARE MINI PEN NEEDLES) 31G X 6 MM MISC Use to inject insulin daily 200 each 1   isosorbide mononitrate (IMDUR) 30 MG 24 hr tablet Take 30 mg by mouth daily.  3   Liraglutide (VICTOZA) 18 MG/3ML SOPN Inject 1.8 mg into the skin daily.     lisinopril-hydrochlorothiazide (PRINZIDE,[name]) 20-25 MG tablet Take 1 tablet by mouth daily.  3   Multiple Vitamin (MULITIVITAMIN WITH MINERALS) TABS Take 1 tablet by mouth daily. Centrum Silver     ONE TOUCH LANCETS MISC Use to check blood sugar twice a day     TRESIBA FLEXTOUCH 200 UNIT/ML SOPN INJECT 46 UNITS BY SUBCUTANEOUS ROUTE 1 TIME PER DAY EVERY EVENING  3   Magnesium 200 MG TABS Take by mouth daily.     meloxicam (MOBIC) 15 MG tablet Take 1 tablet (15 mg total) by mouth daily. (Patient not taking: Reported on 04/23/2019) 60 tablet 1   ranitidine (ZANTAC) 300 MG capsule Take 1 capsule (300 mg total) by mouth 2 (two) times daily. (Patient not taking: Reported on 04/23/2019) 180 capsule 1   No current facility-administered medications for this visit.       Review of Systems:  ROS  PHYSICAL EXAM: BP 137/81 (BP Location: Right Arm)    Pulse 83    Temp (!) 97.5 F (36.4 C) (Skin)    Resp 20    Ht 5\' 3"  (1.6 m)    Wt 71.4 kg    SpO2 96% Comment: RA   BMI 27.88 kg/m  General appearance: alert, cooperative and no distress Head: Normocephalic, without obvious abnormality, atraumatic Neck: no adenopathy, no carotid bruit, no JVD, supple, symmetrical, trachea midline and thyroid not enlarged, symmetric, no tenderness/mass/nodules Lymph nodes: Cervical, supraclavicular, and  axillary nodes normal. Resp: clear to auscultation bilaterally Cardio: regular rate and rhythm, S1, S2 normal, no murmur, click, rub or gallop GI: soft, non-tender; bowel sounds normal; no masses,  no organomegaly Extremities: extremities normal, atraumatic, no cyanosis or edema and Homans sign is negative, no sign of DVT Neurologic: Grossly normal   DIAGNOSTIC STUDIES & LABORATORY DATA:  RECENT RADIOLOGY FINDINGS: Ct Chest Wo Contrast  Result Date: 04/23/2019 CLINICAL DATA:  Follow up of lung nodules EXAM: CT CHEST WITHOUT CONTRAST TECHNIQUE: Multidetector CT imaging of the chest was performed following the standard protocol without IV contrast. COMPARISON:  Multiple exams, including 04/17/2018 and 09/22/2015 FINDINGS: Cardiovascular: Coronary, aortic arch, and branch vessel atherosclerotic vascular disease. Aberrant right subclavian artery passes behind the esophagus. Mediastinum/Nodes: Stable mild prominence of the thyroid gland diffusely. No pathologic adenopathy is observed. Lungs/Pleura: Right upper lobe ground-glass density pulmonary nodule measures 1.3 by 1.0 cm on image 36/8, previously by my measurements 1.3 by 0.9 cm on 04/17/2018 and 1.2 by 0.8 cm on 09/22/2015. Bandlike 1.3 by 0.5 cm nodular density in the right lower lobe on image 74/8 potentially with some adjacent mild volume loss, previously measured at 1.3 by 0.6 cm on 04/17/2018, prior to that sub solid in nature and measuring 1.3 by 1.2 cm on 09/22/2015. Ground-glass density left upper lobe nodule 1.1 by 0.8 cm on image 29/8, no change from 04/17/2018 and by my measurements previously 1.0 by 0.7 cm on 09/22/2015. Mild centrilobular emphysema. In the right upper lobe a faint 0.6 by 0.4 cm ground-glass density nodule on image 55/8 is stable from 09/22/2015. Upper Abdomen: Cholecystectomy. Abdominal aortic atherosclerotic calcification. Musculoskeletal: Mild midthoracic spondylosis. IMPRESSION: 1. The sub solid pulmonary  nodules in both  lungs are stable from 04/17/2018. The right upper lobe nodule may be very subtly increased in size from 09/22/2015, and the bandlike right lower lobe nodule although stable from last year is actually decreased in size from the larger but less solid-appearing nodule shown on 09/22/2015. Indolent low-grade adenocarcinoma remains a diagnostic possibility, and given the lack of any significant progression, continued surveillance imaging seems reasonable. 2. Other imaging findings of potential clinical significance: Coronary atherosclerosis. Aperture right subclavian artery. Aortic Atherosclerosis (ICD10-I70.0) and Emphysema (ICD10-J43.9). Electronically Signed   By: Van Clines M.D.   On: 04/23/2019 12:57  I have independently reviewed the above radiology studies  and reviewed the findings with the patient.   Ct Chest Wo Contrast  Result Date: 04/17/2018 CLINICAL DATA:  Pulmonary nodules. EXAM: CT CHEST WITHOUT CONTRAST TECHNIQUE: Multidetector CT imaging of the chest was performed following the standard protocol without IV contrast. COMPARISON:  08/27/2017.  09/16/2014.  11/07/2011. FINDINGS: Cardiovascular: The heart size is normal. No substantial pericardial effusion. Coronary artery calcification is evident. Atherosclerotic calcification is noted in the wall of the thoracic aorta. Aberrant origin right subclavian artery noted. Mediastinum/Nodes: No mediastinal lymphadenopathy. No evidence for gross hilar lymphadenopathy although assessment is limited by the lack of intravenous contrast on today's study. There is no axillary lymphadenopathy. Lungs/Pleura: The central tracheobronchial airways are patent. The bilateral ground-glass nodules are again identified, stable in the interval. Lesion described as enlarging on the most recent comparison study in the anterior right lower lobe is smaller (6 x 13 mm image 71/series 8) and now stable when comparing back to 12/20/2016. The apparent enlargement on the  most recent comparison study likely related to volume averaging and slice collimation. Coronal imaging shows this elongated nodular opacity to be incorporated into a linear band of atelectasis or scarring. 8 x 11 mm ground-glass nodule medial left upper lobe (28/8) is stable. 6 mm peripheral right upper lobe ground-glass nodule is stable (53/8). Other scattered smaller ground-glass nodules are unchanged and the very tiny right middle lobe subpleural nodule (73/8) is stable. Upper Abdomen: Gallbladder surgically absent. Musculoskeletal: No worrisome lytic or sclerotic osseous abnormality. IMPRESSION: 1. Peripheral right lower lobe sub solid nodule in question on the most recent comparison study measures smaller today and is now similar appearance to the 12/20/2016 exam. This area is incorporated into a linear band of atelectasis or scarring in the anterior right lower lobe. When I remeasure this nodule on 09/16/2014, it is 12 mm long axis and it was measured at 13 mm long axis on the 11/07/2011 exam. 2. Next largest ground-glass nodule is in the medial left upper lobe, measuring 8 x 11 mm today. This nodule was measured at 8 x 12 mm on 11/07/2011. 3. 6 mm peripheral right upper lobe ground-glass nodule (53/8 today) was probably present but less evident on the 2013 and 2016 exams because of the thicker slice collimation used for those studies. 4. No new or progressive finding since the study of 08/27/2017. Electronically Signed   By: Misty Stanley M.D.   On: 04/17/2018 15:56    I have independently reviewed the above radiology studies  and reviewed the findings with the patient.     CT CHEST WO CONTRAST  RESULT DATE: 12/20/2016 CLINICAL DATA: FOLLOW-UP PULMONARY NODULES. EXAM: CT CHEST WITHOUT CONTRAST TECHNIQUE: MULTIDETECTOR CT IMAGING OF THE CHEST WAS PERFORMED FOLLOWING THE STANDARD PROTOCOL WITHOUT IV CONTRAST. COMPARISON: CHEST CT 06/14/2016; 03/02/2014. FINDINGS: CARDIOVASCULAR: NORMAL HEART SIZE.  CORONARY ARTERIAL VASCULAR CALCIFICATIONS.  AORTIC ATHEROSCLEROSIS. MEDIASTINUM/NODES: NO ENLARGED AXILLARY, MEDIASTINAL OR HILAR LYMPHADENOPATHY. THE ESOPHAGUS IS NORMAL IN APPEARANCE. LUNGS/PLEURA: CENTRAL AIRWAYS ARE PATENT. SIMILAR-APPEARING 10 X 8 MM GROUNDGLASS NODULE WITHIN THE LEFT UPPER LOBE (IMAGE 21; SERIES 4). SIMILAR APPEARING 13 X 9 MM GROUNDGLASS COMPONENT OF PULMONARY NODULE WITHIN THE RIGHT LOWER LOBE (IMAGE 52; SERIES 4). GROSSLY UNCHANGED 6 MM SOLID COMPONENT (IMAGE 53; SERIES 4). UNCHANGED 6 MM GROUNDGLASS NODULE WITHIN THE RIGHT MIDDLE LOBE (IMAGE 39; SERIES 4). NO PLEURAL EFFUSION OR PNEUMOTHORAX. 5 MM SUBPLEURAL LEFT LOWER LOBE NODULE (IMAGE 70; SERIES 4), STABLE DATING BACK TO 2017. UPPER ABDOMEN: PATIENT STATUS POST CHOLECYSTECTOMY. NORMAL ADRENAL GLANDS. 3 MM NONOBSTRUCTING STONE INTERPOLAR REGION RIGHT KIDNEY. MUSCULOSKELETAL: NO AGGRESSIVE OR ACUTE APPEARING OSSEOUS LESIONS. IMPRESSION: WHEN COMPARED TO RECENT PRIOR EXAMINATION THERE HAS BEEN NO SIGNIFICANT INTERVAL CHANGE IN SIZE OF GROUNDGLASS NODULE WITHIN THE LEFT UPPER LOBE AS WELL AS MIXED SOLID AND GROUNDGLASS NODULE WITHIN THE RIGHT LOWER LOBE. GIVEN THE APPEARANCE OF THESE NODULES AND SLIGHT INTERVAL CHANGE OVER MULTIPLE EXAMS, SLOW GROWING ADENOCARCINOMA IS NOT EXCLUDED. AT A MINIMUM, RECOMMEND FOLLOW-UP CHEST CT IN 6 MONTHS. 5 MM SUBPLEURAL LEFT LOWER NO NODULE, STABLE DATING BACK TO 2017. RECOMMEND ATTENTION ON FOLLOW-UP. AORTIC ATHEROSCLEROSIS (ICD 10-I 70.0). ELECTRONICALLY SIGNED BY: Dian Situ DAVIS M.D. ON: 12/20/2016 946  CT CHEST WO CONTRAST  RESULT DATE: 06/14/2016 CLINICAL DATA: 48-YEAR-OLD FEMALE WITH HISTORY OF PULMONARY NODULES. ANNUAL FOLLOW-UP. EXAM: CT CHEST WITHOUT CONTRAST TECHNIQUE: MULTIDETECTOR CT IMAGING OF THE CHEST WAS PERFORMED FOLLOWING THE STANDARD PROTOCOL WITHOUT IV CONTRAST. COMPARISON: CHEST CT 09/22/2015 AND SEVERAL OTHER ARE MORE REMOTE PRIOR EXAMINATIONS. FINDINGS: NOTE: ON TODAY'S STUDY, THE  PATIENT APPEARS TO HAVE BEEN PROPPED UPWARD ON THE CT SCANNER TABLE, RESULTING IN SLIGHTLY DIFFERENT SAMPLING OF THE PREVIOUSLY NOTED PULMONARY NODULES ON ANY GIVEN AXIAL IMAGE. CARDIOVASCULAR: HEART SIZE IS NORMAL. THERE IS NO SIGNIFICANT PERICARDIAL FLUID, THICKENING OR PERICARDIAL CALCIFICATION. THERE IS AORTIC ATHEROSCLEROSIS, AS WELL AS ATHEROSCLEROSIS OF THE GREAT VESSELS OF THE MEDIASTINUM AND THE CORONARY ARTERIES, INCLUDING CALCIFIED ATHEROSCLEROTIC PLAQUE IN THE LEFT MAIN, LEFT ANTERIOR DESCENDING, LEFT CIRCUMFLEX AND RIGHT CORONARY ARTERIES. 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 MEDIASTINUM/NODES: NO PATHOLOGICALLY ENLARGED MEDIASTINAL OR HILAR LYMPH NODES. PLEASE NOTE THAT ACCURATE EXCLUSION OF HILAR ADENOPATHY IS LIMITED ON NONCONTRAST CT SCANS. ESOPHAGUS IS UNREMARKABLE IN APPEARANCE. NO AXILLARY LYMPHADENOPATHY. LUNGS/PLEURA: PREVIOUSLY NOTED GROUNDGLASS ATTENUATION NODULE IN THE MEDIAL ASPECT OF THE LEFT UPPER LOBE IS STABLE IN SIZE MEASURING 11 X 8 MM (MEASUREMENTS WERE MADE ON IMAGES 22 AND 23 OF SERIES 4 TO DIRECTLY COMPARE WITH THE PRIOR STUDY AND COMPENSATE FOR SLIGHT DIFFERENCE IN SAMPLING OF THIS LESION ON AXIAL IMAGES). THE OTHER PREVIOUSLY NOTED MIXED GROUNDGLASS ATTENUATION AND SOLID LESION IN THE PERIPHERY OF THE RIGHT LOWER LOBE DEMONSTRATES A SMALLER GROUNDGLASS ATTENUATION COMPONENT MEASURING ONLY 9 X 14 MM (AXIAL IMAGE 59 OF SERIES 4) ON TODAY'S STUDY (PREVIOUSLY 12 X 13 MM), BUT AN ENLARGING SOFT TISSUE ATTENUATION COMPONENT MEASURING 6 MM IN GREATEST LENGTH (AXIAL IMAGE 60 OF SERIES 3). NO OTHER NEW SUSPICIOUS APPEARING PULMONARY NODULES OR MASSES ARE NOTED. THERE IS NO ACUTE CONSOLIDATIVE AIRSPACE DISEASE, AND NO PLEURAL EFFUSIONS. UPPER ABDOMEN: AORTIC ATHEROSCLEROSIS. STATUS POST CHOLECYSTECTOMY. MUSCULOSKELETAL: THERE ARE NO AGGRESSIVE APPEARING LYTIC OR BLASTIC LESIONS NOTED IN THE VISUALIZED PORTIONS OF THE SKELETON. IMPRESSION: 1.  PREVIOUSLY NOTED GROUNDGLASS ATTENUATION NODULE IN THE LEFT UPPER LOBE IS STABLE. OTHER PREVIOUSLY NOTED MIXED SOLID AND SUB SOLID LESION IN THE PERIPHERY OF THE RIGHT LOWER LOBE HAS  CHANGED SLIGHTLY, WITH THE DECREASING GROUNDGLASS ATTENUATION COMPONENT AND INCREASING SOLID COMPONENT WHICH CURRENTLY MEASURES UP TO 6 MM ALTHOUGH THE INTERVAL DECREASE IN SIZE OF THE GROUNDGLASS ATTENUATION COMPONENT IS UNUSUAL, THE ENLARGING SOLID COMPONENT TO THIS LESION IS CONCERNING FOR POTENTIAL DEVELOPING INVASIVE ADENOCARCINOMA, PARTICULARLY IN LIGHT OF THE NODULE'S BEHAVIOR OVER THE PAST SEVERAL YEARS. GIVEN THE SMALL SIZE OF THIS NODULE, THE PET CT MAY BE OF LIMITED VALUE. AT THE VERY LEAST, CLOSE ATTENTION ON FOLLOW-UP CHEST CT IS RECOMMENDED. ALTERNATIVELY, CONSIDERATION FOR BIOPSY OR WEDGE RESECTION MAY BE WARRANTED IF CLINICALLY APPROPRIATE. 2. AORTIC ATHEROSCLEROSIS, IN ADDITION TO LEFT MAIN AND 3 VESSEL CORONARY ARTERY DISEASE. PLEASE NOTE THAT ALTHOUGH THE PRESENCE OF CORONARY ARTERY CALCIUM DOCUMENTS THE PRESENCE OF CORONARY ARTERY DISEASE, THE SEVERITY OF THIS DISEASE AND ANY POTENTIAL STENOSIS CANNOT BE ASSESSED ON THIS NON-GATED CT EXAMINATION. ASSESSMENT FOR POTENTIAL RISK FACTOR MODIFICATION, DIETARY THERAPY OR PHARMACOLOGIC THERAPY MAY BE WARRANTED, IF CLINICALLY INDICATED. THESE RESULTS WILL BE CALLED TO THE ORDERING CLINICIAN OR REPRESENTATIVE BY THE RADIOLOGIST ASST., AND COMMUNICATION DOCUMENTED IN THE PACS OR ZVISION DASHBOARD. ELECTRONICALLY SIGNED BY: Vinnie Langton M.D. ON: 06/14/2016 1253  I HAVE INDEPENDENTLY REVIEWED THE ABOVE RADIOLOGY STUDIES AND REVIEWED THE FINDINGS WITH THE PATIENT.    CT CHEST WO CONTRAST  09/22/2015 CLINICAL DATA: 49-YEAR-OLD FEMALE WITH HISTORY OF PULMONARY NODULE. FOLLOW-UP STUDY. EXAM: CT CHEST WITHOUT CONTRAST TECHNIQUE: MULTIDETECTOR CT IMAGING OF THE CHEST WAS PERFORMED FOLLOWING THE STANDARD PROTOCOL WITHOUT IV CONTRAST. COMPARISON: CHEST CT 09/16/2014.  FINDINGS: MEDIASTINUM/LYMPH NODES: HEART SIZE IS NORMAL. THERE IS NO SIGNIFICANT PERICARDIAL FLUID, THICKENING OR PERICARDIAL CALCIFICATION. THERE IS ATHEROSCLEROSIS OF THE THORACIC AORTA, THE GREAT VESSELS OF THE MEDIASTINUM AND THE CORONARY ARTERIES, INCLUDING CALCIFIED ATHEROSCLEROTIC PLAQUE IN THE LEFT MAIN, LEFT ANTERIOR DESCENDING, LEFT CIRCUMFLEX AND RIGHT CORONARY ARTERIES. 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 NO PATHOLOGICALLY ENLARGED MEDIASTINAL OR HILAR LYMPH NODES. PLEASE NOTE THAT ACCURATE EXCLUSION OF HILAR ADENOPATHY IS LIMITED ON NONCONTRAST CT SCANS. ESOPHAGUS IS UNREMARKABLE IN APPEARANCE. NO AXILLARY LYMPHADENOPATHY. LUNGS/PLEURA: THE NODULE IN THE PERIPHERY OF THE RIGHT LOWER LOBE APPEARS SLIGHTLY LARGER THAN THE PRIOR STUDY, BUT IS PREDOMINANTLY GROUNDGLASS ATTENUATION MEASURING UP TO 13 X 12 MM (IMAGE 70 OF SERIES 8) WITH ONLY A SMALL 3 MM SOLID INTERNAL COMPONENT (IMAGE 70 OF SERIES 7). PREVIOUSLY NOTED GROUNDGLASS ATTENUATION NODULE IN THE MEDIAL ASPECT OF THE LEFT UPPER LOBE IS ALSO SLIGHTLY LARGER THAN THE PRIOR EXAMINATION MEASURING 11 X 7 MM ON TODAY'S STUDY, WITH NO CENTRAL SOLID COMPONENT. NO NEW SUSPICIOUS APPEARING PULMONARY NODULES OR MASSES ARE OTHERWISE NOTED. NO ACUTE CONSOLIDATIVE AIRSPACE DISEASE. NO PLEURAL EFFUSIONS. MILD DIFFUSE BRONCHIAL WALL THICKENING WITH MILD CENTRILOBULAR EMPHYSEMA. UPPER ABDOMEN: STATUS POST CHOLECYSTECTOMY. ATHEROSCLEROSIS. MUSCULOSKELETAL/SOFT TISSUES: THERE ARE NO AGGRESSIVE APPEARING LYTIC OR BLASTIC LESIONS NOTED IN THE VISUALIZED PORTIONS OF THE SKELETON. IMPRESSION: 1. SLOW PROGRESSIVE ENLARGEMENT OF 2 SUBSOLID LESIONS IN THE LEFT UPPER LOBE AND PERIPHERY OF THE RIGHT LOWER LOBE, AS ABOVE. THESE LESIONS REMAINS CONCERNING FOR POTENTIAL SLOW-GROWING NEOPLASM SUCH AS ADENOCARCINOMA. AT THIS TIME, CONTINUED IMAGING SURVEILLANCE IS RECOMMENDED. SPECIFICALLY, REPEAT NONCONTRAST CHEST CT IS RECOMMENDED IN  12 MONTHS. THIS RECOMMENDATION FOLLOWS THE CONSENSUS STATEMENT: GUIDELINES FOR MANAGEMENT OF INCIDENTAL PULMONARY NODULES DETECTED ON CT IMAGES: FROM THE [place] 2017; PUBLISHED ONLINE BEFORE [place] ([id number]/RADIOL [id number]). 2. MILD DIFFUSE BRONCHIAL WALL THICKENING WITH MILD CENTRILOBULAR EMPHYSEMA; IMAGING FINDINGS SUGGESTIVE OF UNDERLYING COPD 3. ATHEROSCLEROSIS, INCLUDING LEFT MAIN AND 3 VESSEL CORONARY ARTERY DISEASE. PLEASE NOTE THAT ALTHOUGH THE PRESENCE OF  CORONARY ARTERY CALCIUM DOCUMENTS THE PRESENCE OF CORONARY ARTERY DISEASE, THE SEVERITY OF THIS DISEASE AND ANY POTENTIAL STENOSIS CANNOT BE ASSESSED ON THIS NON-GATED CT EXAMINATION. ASSESSMENT FOR POTENTIAL RISK FACTOR MODIFICATION, DIETARY THERAPY OR PHARMACOLOGIC THERAPY MAY BE WARRANTED, IF CLINICALLY INDICATED. ELECTRONICALLY SIGNED BY: Vinnie Langton M.D. ON: 09/22/2015 903  CT CHEST WO CONTRAST  09/16/2014 CLINICAL DATA: FORMER SMOKER. FOLLOW-UP INDETERMINATE PULMONARY NODULE. EXAM: CT CHEST WITHOUT CONTRAST TECHNIQUE: MULTIDETECTOR CT IMAGING OF THE CHEST WAS PERFORMED FOLLOWING THE STANDARD PROTOCOL WITHOUT IV CONTRAST. COMPARISON: CT 03/02/2014, 01/15/2013 FINDINGS: MEDIASTINUM/NODES: NO AXILLARY OR SUPRACLAVICULAR LYMPHADENOPATHY. NO MEDIASTINAL HILAR LYMPHADENOPATHY. NO PERICARDIAL FLUID. CORONARY ARTERY CALCIFICATIONS ARE NOTED. ESOPHAGUS IS NORMAL. LUNGS/PLEURA: THE RIGHT LOWER LOBE PULMONARY NODULE OF CONCERN MEASURES 9 MM X 4 MM NOT CHANGED FROM 10 MM X 5 MM (IMAGE 28, SERIES 4). THIS LESION IS PROBABLY BEST SEEN ON THE CORONAL IMAGING MEASURING 6 MM ON IMAGE 12 COMPARED TO 6 MM ON PRIOR. A GROUNDGLASS NODULE IN THE LEFT UPPER LOBE MEASURES 8 MM (IMAGE 12, SERIES 4) UNCHANGED FROM 8 MM ON PRIOR. NO NEW PULMONARY NODULES ARE PRESENT. UPPER ABDOMEN: LIMITED VIEW OF THE LIVER, KIDNEYS, PANCREAS ARE UNREMARKABLE. NORMAL ADRENAL GLANDS. MUSCULOSKELETAL: NO AGGRESSIVE OSSEOUS LESION. IMPRESSION: 1. STABLE RIGHT LOWER LOBE  PULMONARY NODULE OVER 7 MONTH INTERVAL. RECOMMEND FOLLOW-UP CT WITHOUT CONTRAST IN 12 MONTHS. 2. STABLE LEFT UPPER LOBE PULMONARY NODULE OVER GREATER THAN 3 YEARS. ELECTRONICALLY SIGNED BY: Suzy Bouchard M.D. ON: 09/16/2014 1232   CT CHEST WO CONTRAST  03/02/2014 CLINICAL DATA: FALL PULMONARY NODULES EXAM: CT CHEST WITHOUT CONTRAST TECHNIQUE: MULTIDETECTOR CT IMAGING OF THE CHEST WAS PERFORMED FOLLOWING THE STANDARD PROTOCOL WITHOUT IV CONTRAST. COMPARISON: NONE. FINDINGS: LEFT UPPER LOBE PULMONARY NODULE MEASURING 9 MM IS UNCHANGED FROM PRIOR THIS NODULE IS STABLE OVER 3 YEARS. THE RIGHT LOWER LOBE PULMONARY NODULE HAS CHANGE IN MORPHOLOGY FROM A GROUNDGLASS NODULE TO A SOLID NODULE MEASURING 10 MM ABOUT 5 MM AND HAS AN ELONGATED MORPHOLOGY (IMAGE 34, SERIES 4). OVERALL SIZE LESION IS NOT CHANGED AS PREVIOUSLY MEASURED 15 X 9 MM NO NEW PULMONARY NODULES PRESENT. NO AXILLARY SUPRACLAVICULAR LYMPHADENOPATHY. NO MEDIASTINAL HILAR LYMPHADENOPATHY. NO PERICARDIAL FLUID. ADRENAL GLANDS ARE NORMAL. POSTCHOLECYSTECTOMY. NO AGGRESSIVE OSSEOUS LESION. IMPRESSION: 1. STABLE LEFT UPPER LOBE PULMONARY NODULE OVER 3 YEAR DISTAL BENIGN ETIOLOGY. 2. CHANGE IN MORPHOLOGY OF THE RIGHT LOWER LOBE FROM GROUNDGLASS TO SOLID NODULE. RECOMMEND FOLLOW-UP CT WITHOUT CONTRAST IN 12 MONTHS TO REASSESS THIS CHANGING BUT NOT ENLARGING NODULE. ELECTRONICALLY SIGNED BY: Suzy Bouchard M.D. ON: 03/03/2019 1514:59  US ABDOMEN LIMITED RUQ  02/11/2014 CLINICAL DATA: HEPATOMA SURVEILLANCE, ON MEDICATION FOR HYPERTENSION EXAM: US ABDOMEN LIMITED-RIGHT UPPER QUADRANT COMPARISON: ULTRASOUND OF THE RIGHT UPPER QUADRANT OF 03/04/2013 FINDINGS: GALLBLADDER: THE GALLBLADDER HAS PREVIOUSLY BEEN RESECTED. AS NOTED ON THE PRIOR ULTRASOUND FROM 2014, A SMALL CYSTIC STRUCTURE REMAINS IN THE GALLBLADDER FOSSA OF 1.30.9 X 1.1 CM AND IS UNCHANGED, OF QUESTIONABLE SIGNIFICANCE. NO PERISTALSIS IS SEEN IN THIS REGION. COMMON BILE DUCT: DIAMETER: THE  COMMON BILE DUCT IS WITHIN NORMAL LIMITS POST CHOLECYSTECTOMY MEASURING 7.7 MM IN DIAMETER. LIVER: THE LIVER HAS A NORMAL ECHOGENIC PATTERN. NO FOCAL HEPATIC ABNORMALITY IS SEEN. IMPRESSION: 1. NO FOCAL HEPATIC ABNORMALITY. 2. NO CHANGE IN A SMALL CYSTIC STRUCTURE IN THE GALLBLADDER FOSSA OF QUESTIONABLE SIGNIFICANCE. ELECTRONICALLY SIGNED BY: Ivar Drape M.D. ON: 02/11/2014 911 CT CHEST LOW DOSE PILOT WITHOUT CENTIMETERS  01/15/2013*RADIOLOGY REPORT*CLINICAL DATA: FOLLOW-UP EVALUATION OF PREVIOUSLY NOTED LUNG LESIONS. CT CHEST LOW DOSE PILOT WITHOUT CONTRAST TECHNIQUE: MULTIDETECTOR CT IMAGING OF THE  CHEST USING THE STANDARD LOW-DOSE PROTOCOL WITHOUT ADMINISTRATION OF INTRAVENOUS CONTRAST. COMPARISON: CHEST CT 11/07/2011. FINDINGS: MEDIASTINUM: HEART SIZE IS NORMAL. THERE IS NO SIGNIFICANT PERICARDIAL FLUID, THICKENING OR PERICARDIAL CALCIFICATION. THERE IS ATHEROSCLEROSIS OF THE THORACIC AORTA, THE GREAT VESSELS OF THE MEDIASTINUM AND THE CORONARY ARTERIES, INCLUDING CALCIFIED ATHEROSCLEROTIC PLAQUE IN THE LEFT MAIN, LEFT ANTERIOR DESCENDING AND RIGHT CORONARY ARTERIES. NO PATHOLOGICALLY ENLARGED MEDIASTINAL OR HILAR LYMPH NODES. PLEASE NOTE THAT ACCURATE EXCLUSION OF HILAR ADENOPATHY IS LIMITED ON NONCONTRAST CT SCANS. 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 ESOPHAGUS IS UNREMARKABLE IN APPEARANCE. LUNGS/PLEURA: THE PREVIOUSLY NOTED 4 MM RIGHT UPPER LOBE GROUND-GLASS ATTENUATION NODULE IS UNCHANGED (IMAGE 113 OF SERIES 102). IN THE MEDIAL ASPECT OF THE LEFT UPPER LOBE (IMAGE 60 OF SERIES 102) THERE IS A 12 X 8 MM ILL-DEFINED GROUNDGLASS ATTENUATION NODULE WHICH IS UNCHANGED. LIKEWISE, IN THE PERIPHERY OF THE RIGHT LOWER LOBE (IMAGE 100 OF SERIES 102) IS A 1.3 X 0.8 CM PLEURAL-BASED GROUNDGLASS ATTENUATION NODULE WHICH IS ALSO UNCHANGED. NO DEFINITE NEW SUSPICIOUS APPEARING PULMONARY NODULES OR MASSES ARE OTHERWISE IDENTIFIED. NO ACUTE CONSOLIDATIVE AIR SPACE DISEASE. NO  PLEURAL EFFUSIONS. 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 UPPER ABDOMEN: STATUS POST CHOLECYSTECTOMY. MUSCULOSKELETAL: THERE ARE NO AGGRESSIVE APPEARING LYTIC OR BLASTIC LESIONS NOTED IN THE VISUALIZED PORTIONS OF THE SKELETON. IMPRESSION: 1. NO SIGNIFICANT CHANGE IN NUMEROUS GROUNDGLASS ATTENUATION NODULES SCATTERED THROUGHOUT THE LUNGS BILATERALLY, AS ABOVE. AT THIS POINT, THESE NODULES HAVE BEEN STABLE IN SIZE AND APPEARANCE COMPARED TO PRIOR STUDY FROM 12/12/2010. AN ADDITIONAL 1 YEAR FOLLOW-UP IS RECOMMENDED AT THIS TIME. THIS RECOMMENDATION FOLLOWS THE CONSENSUS STATEMENT: RECOMMENDATIONS FOR THE MANAGEMENT OF SUBSOLID PULMONARY NODULES DETECTED AT CT: A STATEMENT FROM THE [place] AS PUBLISHED IN RADIOLOGY 2013; [id number]. 2. MILD CENTRILOBULAR EMPHYSEMA. 3. 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 PLEASE NOTE THAT ALTHOUGH THE PRESENCE OF CORONARY ARTERY CALCIUM DOCUMENTS THE PRESENCE OF CORONARY ARTERY DISEASE, THE SEVERITY OF THIS DISEASE AND ANY POTENTIAL STENOSIS CANNOT BE ASSESSED ON THIS NON-GATED CT EXAMINATION. ASSESSMENT FOR POTENTIAL RISK FACTOR MODIFICATION, DIETARY THERAPY OR PHARMACOLOGIC THERAPY MAY BE WARRANTED, IF CLINICALLY INDICATED. 4. 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 5. STATUS POST CHOLECYSTECTOMY. ORIGINAL REPORT AUTHENTICATED BY: Vinnie Langton, M.D.  Chronic Kidney Disease   Stage I     GFR >90  Stage II    GFR 60-89  Stage IIIA GFR 45-59  Stage IIIB GFR 30-44  Stage IV   GFR 15-29  Stage V    GFR  <15  Lab Results  Component Value Date   CREATININE 1.92 (H) 06/17/2014   CrCl cannot be calculated (Patient's most recent lab result is older than the maximum 21 days allowed.).   ASSESSMENT/PLAN: #1 no new pulmonary nodules noted on follow-up CT scan, previous gound glass opacities  remain stable if not slightly decreased in size.  I have reviewed again with  the patient as we have in the past that these still could represent multiple small carcinomas, and groundglass opacities.  Since they have been stable over the past 7 to 8 years we will continue to follow them.  Plan follow-up CT scan in 1 year #2 apparent right subclavian artery incidentally noted #3 patient does have evidence of coronary artery calcification, she has no cardiac symptoms no angina or evidence of congestive heart failure #4 chronic renal insufficiency   Plan see the patient back in 1 year with a follow-up CT of the chest  Bailen Geffre  B  Jeremie.Berkshire M.D.  [address] [place], [place] [address] Office [phone]   Beeper [phone]  04/23/2019 1:03 PM

## 2019-07-07 ENCOUNTER — Other Ambulatory Visit: Payer: Self-pay | Admitting: Family Medicine

## 2019-07-07 ENCOUNTER — Ambulatory Visit
Admission: RE | Admit: 2019-07-07 | Discharge: 2019-07-07 | Disposition: A | Discharge status: Home / Self Care | Payer: Medicare HMO | Source: Ambulatory Visit | Attending: Family Medicine | Admitting: Family Medicine

## 2019-07-07 ENCOUNTER — Encounter (INDEPENDENT_AMBULATORY_CARE_PROVIDER_SITE_OTHER): Payer: Self-pay

## 2019-07-07 DIAGNOSIS — R52 Pain, unspecified: Secondary | ICD-10-CM

## 2019-12-26 IMAGING — US US ABDOMEN LIMITED
1 series · 14 of 25 positions shown · non-contrast
Comparison: 10/03/2006

CLINICAL DATA: Screening for HCC.

EXAM:
ULTRASOUND ABDOMEN LIMITED RIGHT UPPER QUADRANT

[Series 1: us abdomen limited · 0.23mm/px · 14 of 31 slices shown]
[im 1/31]
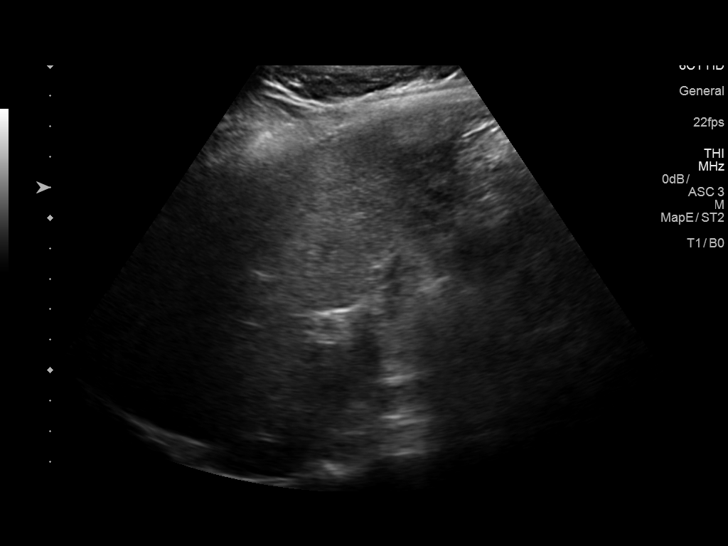
[im 3/31]
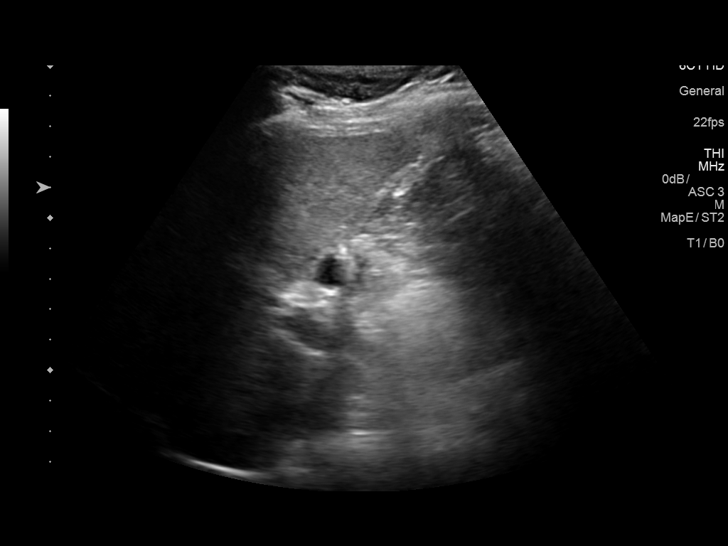
[im 6/31]
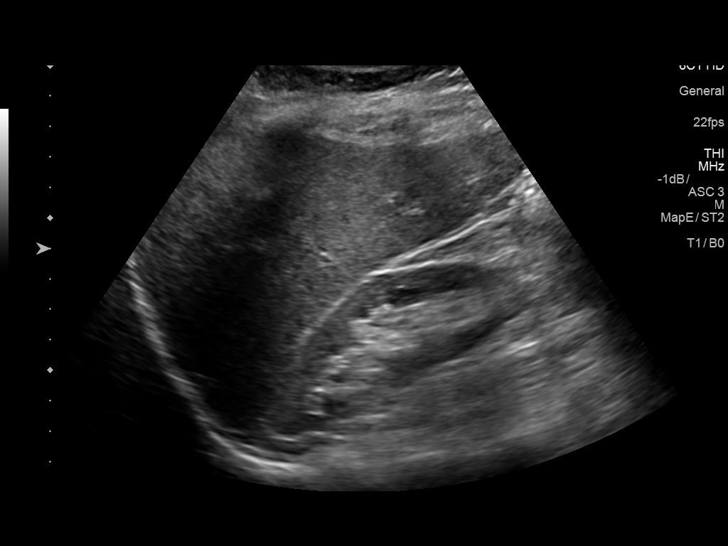
[im 8/31]
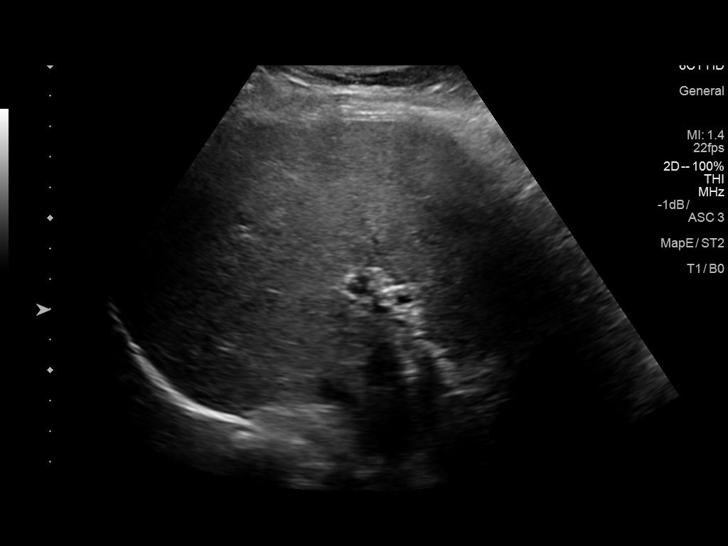
[im 11/31]
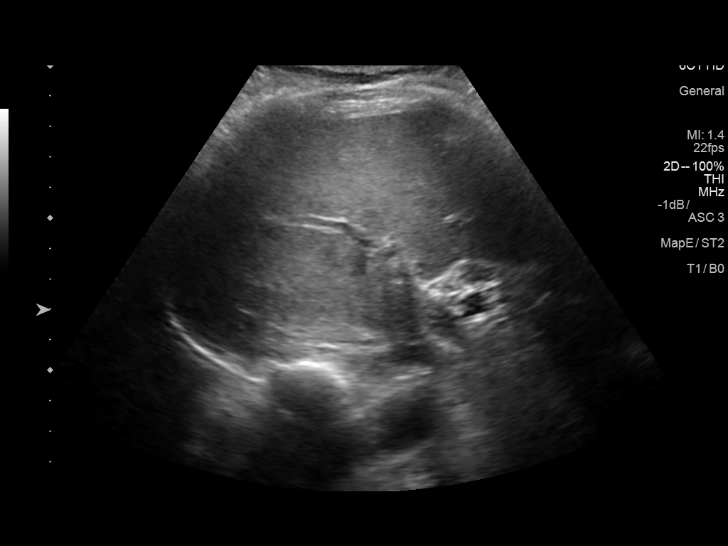
[im 12/31]
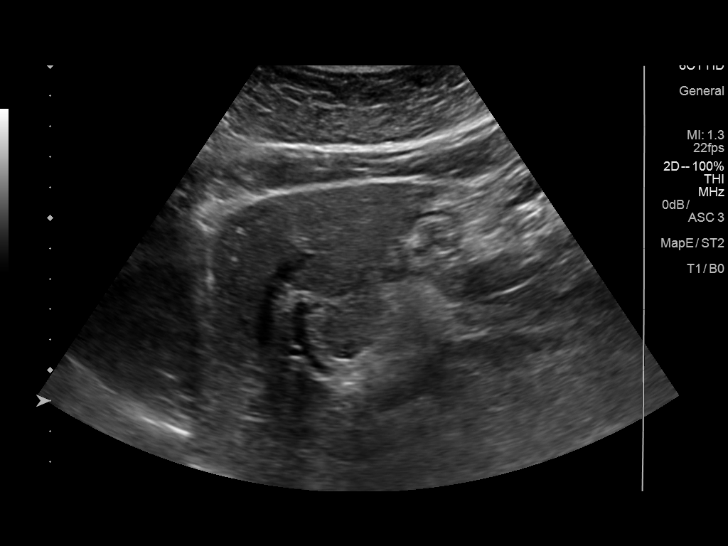
[im 14/31]
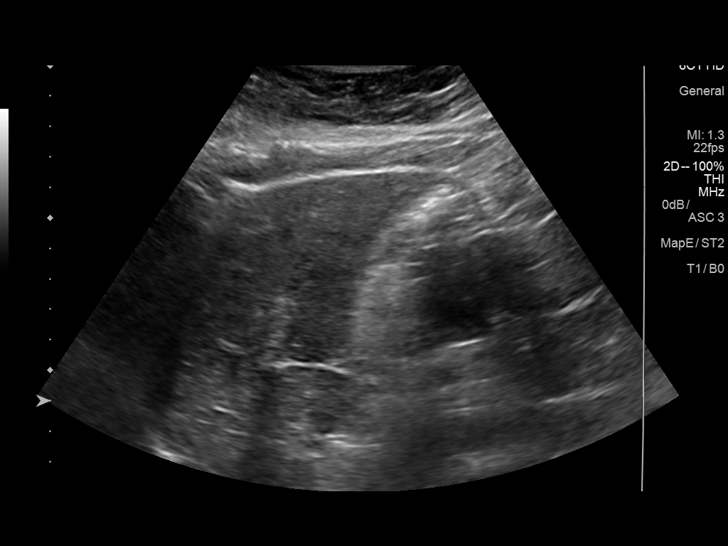
[im 17/31]
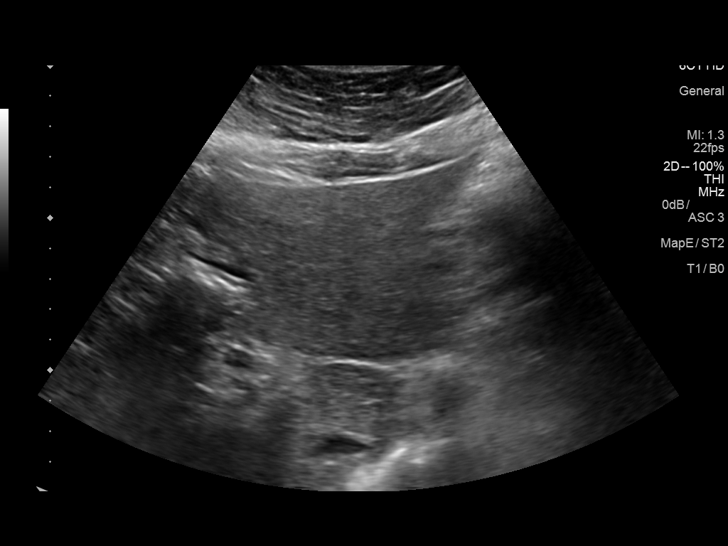
[im 19/31]
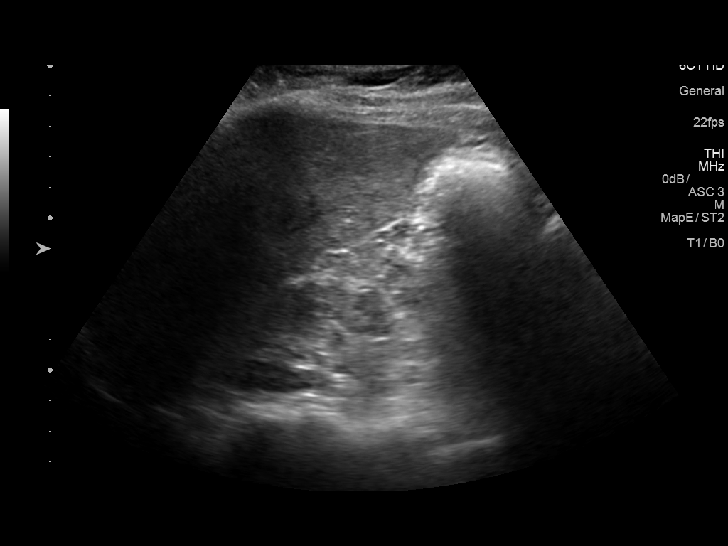
[im 21/31]
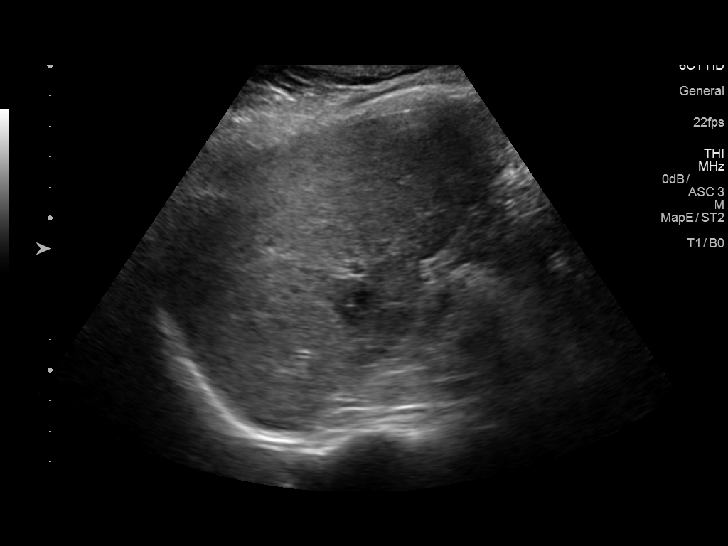
[im 23/31]
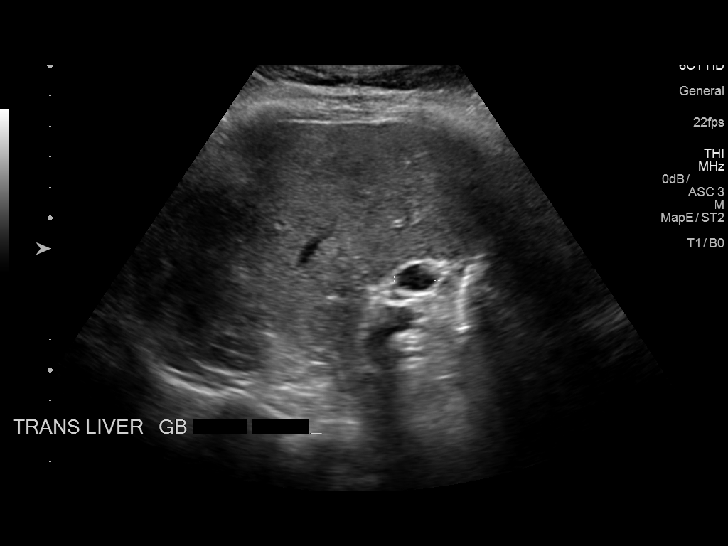
[im 26/31]
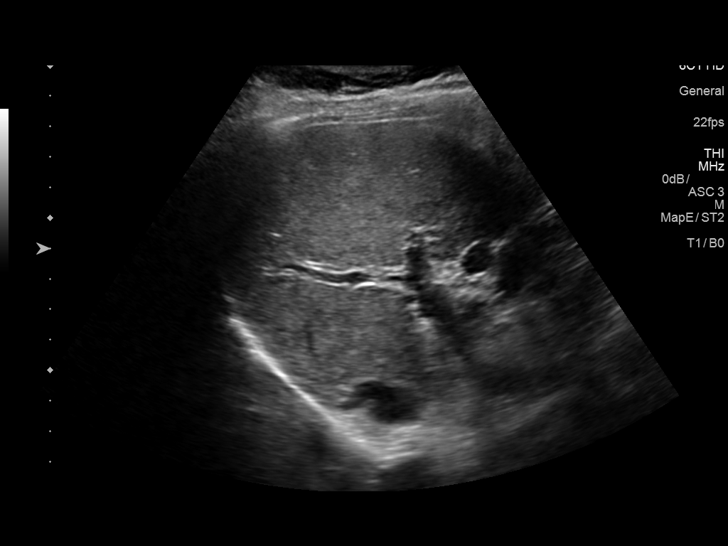
[im 28/31]
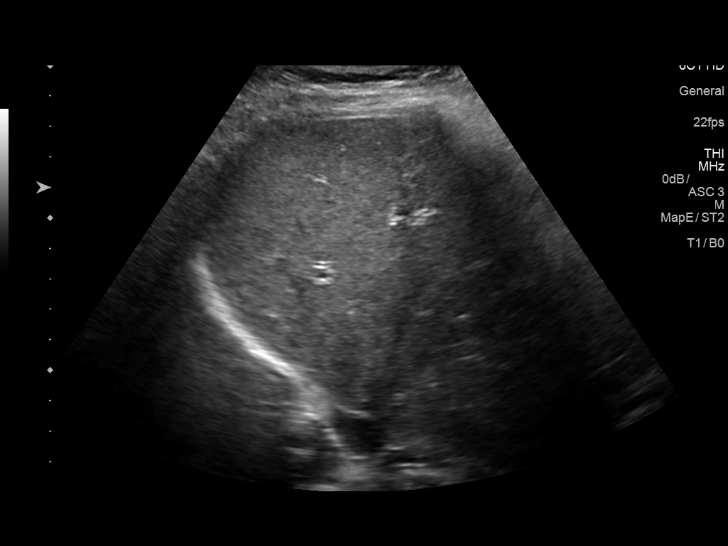
[im 31/31]
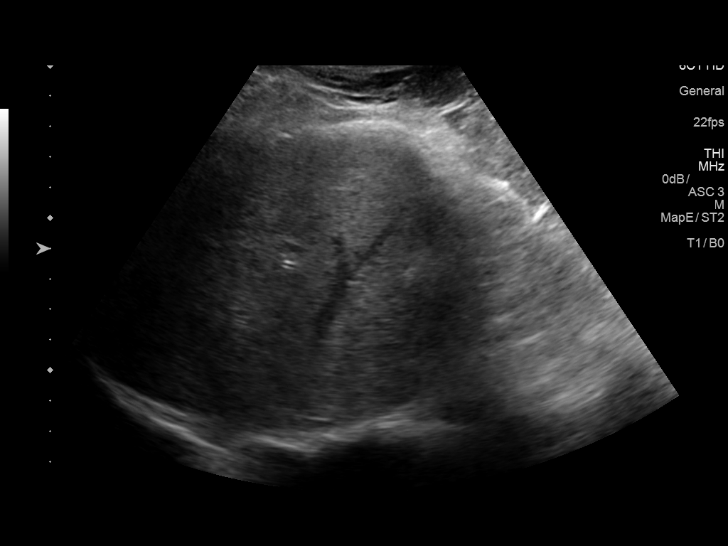

[14 of 25 positions shown; findings below may reference images not displayed]

FINDINGS: Gallbladder:

Post cholecystectomy. Persistent 0.8 x 1.0 x 1.5 cm cystic structure
within the gallbladder fossa.

Common bile duct:

Diameter: 4 mm.

Liver:

No focal lesion identified. Diffusely increased parenchymal
echogenicity. Portal vein is patent on color Doppler imaging with
normal direction of blood flow towards the liver.
IMPRESSION: Diffusely increased parenchymal echogenicity of the liver consistent
with the given history of cirrhosis. No focal masses seen.

Persistent cystic structure within the gallbladder fossa, likely
benign postsurgical finding.

## 2020-02-26 ENCOUNTER — Ambulatory Visit
Admission: RE | Admit: 2020-02-26 | Discharge: 2020-02-26 | Disposition: A | Discharge status: Home / Self Care | Payer: Medicare HMO | Source: Ambulatory Visit | Attending: Family Medicine | Admitting: Family Medicine

## 2020-02-26 ENCOUNTER — Other Ambulatory Visit: Payer: Self-pay | Admitting: Family Medicine

## 2020-02-26 DIAGNOSIS — G8929 Other chronic pain: Secondary | ICD-10-CM

## 2020-02-26 DIAGNOSIS — M25612 Stiffness of left shoulder, not elsewhere classified: Secondary | ICD-10-CM

## 2020-02-26 DIAGNOSIS — M25512 Pain in left shoulder: Secondary | ICD-10-CM

## 2020-03-24 ENCOUNTER — Other Ambulatory Visit: Payer: Self-pay | Admitting: Cardiothoracic Surgery

## 2020-03-29 ENCOUNTER — Other Ambulatory Visit: Payer: Self-pay | Admitting: Cardiothoracic Surgery

## 2020-03-29 DIAGNOSIS — R918 Other nonspecific abnormal finding of lung field: Secondary | ICD-10-CM

## 2020-04-28 ENCOUNTER — Ambulatory Visit: Payer: Medicare HMO | Admitting: Cardiothoracic Surgery

## 2020-04-28 ENCOUNTER — Other Ambulatory Visit: Payer: Medicare HMO

## 2020-05-31 ENCOUNTER — Other Ambulatory Visit: Payer: Self-pay | Admitting: Family Medicine

## 2020-05-31 ENCOUNTER — Ambulatory Visit
Admission: RE | Admit: 2020-05-31 | Discharge: 2020-05-31 | Disposition: A | Discharge status: Home / Self Care | Payer: Medicare HMO | Source: Ambulatory Visit | Attending: Family Medicine | Admitting: Family Medicine

## 2020-05-31 DIAGNOSIS — M25512 Pain in left shoulder: Secondary | ICD-10-CM

## 2020-10-11 ENCOUNTER — Other Ambulatory Visit (HOSPITAL_COMMUNITY): Payer: Self-pay | Admitting: *Deleted

## 2020-10-11 DIAGNOSIS — R1310 Dysphagia, unspecified: Secondary | ICD-10-CM

## 2020-10-19 ENCOUNTER — Ambulatory Visit (HOSPITAL_COMMUNITY)
Admission: RE | Admit: 2020-10-19 | Discharge: 2020-10-19 | Disposition: A | Discharge status: Home / Self Care | Payer: Medicare HMO | Source: Ambulatory Visit | Attending: Family Medicine | Admitting: Family Medicine

## 2020-10-19 ENCOUNTER — Other Ambulatory Visit: Payer: Self-pay

## 2020-10-19 DIAGNOSIS — E118 Type 2 diabetes mellitus with unspecified complications: Secondary | ICD-10-CM | POA: Insufficient documentation

## 2020-10-19 DIAGNOSIS — R1310 Dysphagia, unspecified: Secondary | ICD-10-CM | POA: Insufficient documentation

## 2020-10-19 NOTE — Progress Notes (Signed)
Modified Barium Swallow Progress Note  Patient Details  Name: Audrey Green MRN: [id number] Date of Birth: February 01, 1946  Today's Date: 10/19/2020  Modified Barium Swallow completed.  Full report located under Chart Review in the Imaging Section.  Brief recommendations include the following:  Clinical Impression  Pt demonstrates normal swallow function. Timing and strength WNL. Flash penetration observed occasionally; no coughing or choking occured. No aspiration. Discussed basic strategies to avoid occasional coughing with liquids and also discussed ways to reduce night time coughing - wedge, discussing reflux management with MD. No SLP f/u needed.   Swallow Evaluation Recommendations       SLP Diet Recommendations: Regular solids;Thin liquid   Liquid Administration via: Cup;Straw   Medication Administration: Whole meds with liquid   Supervision: Patient able to self feed                    Audrey Green, Katherene Ponto 10/19/2020,1:20 PM

## 2021-01-17 ENCOUNTER — Other Ambulatory Visit: Payer: Self-pay

## 2021-01-17 ENCOUNTER — Ambulatory Visit
Admission: RE | Admit: 2021-01-17 | Discharge: 2021-01-17 | Disposition: A | Discharge status: Home / Self Care | Payer: Medicare HMO | Source: Ambulatory Visit | Attending: Family Medicine | Admitting: Family Medicine

## 2021-01-17 ENCOUNTER — Other Ambulatory Visit: Payer: Self-pay | Admitting: Family Medicine

## 2021-01-17 DIAGNOSIS — R52 Pain, unspecified: Secondary | ICD-10-CM

## 2021-01-17 DIAGNOSIS — R609 Edema, unspecified: Secondary | ICD-10-CM

## 2022-07-17 ENCOUNTER — Other Ambulatory Visit: Payer: Self-pay | Admitting: Nephrology

## 2022-07-17 DIAGNOSIS — N184 Chronic kidney disease, stage 4 (severe): Secondary | ICD-10-CM

## 2022-07-17 DIAGNOSIS — E1122 Type 2 diabetes mellitus with diabetic chronic kidney disease: Secondary | ICD-10-CM

## 2022-07-17 DIAGNOSIS — R8290 Unspecified abnormal findings in urine: Secondary | ICD-10-CM

## 2022-07-27 ENCOUNTER — Ambulatory Visit
Admission: RE | Admit: 2022-07-27 | Discharge: 2022-07-27 | Disposition: A | Discharge status: Home / Self Care | Payer: Medicare HMO | Source: Ambulatory Visit | Attending: Nephrology | Admitting: Nephrology

## 2022-07-27 DIAGNOSIS — E1122 Type 2 diabetes mellitus with diabetic chronic kidney disease: Secondary | ICD-10-CM

## 2022-07-27 DIAGNOSIS — R8290 Unspecified abnormal findings in urine: Secondary | ICD-10-CM | POA: Diagnosis present

## 2022-07-27 DIAGNOSIS — N184 Chronic kidney disease, stage 4 (severe): Secondary | ICD-10-CM

## 2022-08-09 ENCOUNTER — Other Ambulatory Visit: Payer: Self-pay | Admitting: Thoracic Surgery (Cardiothoracic Vascular Surgery)

## 2022-08-09 DIAGNOSIS — R918 Other nonspecific abnormal finding of lung field: Secondary | ICD-10-CM

## 2022-09-11 ENCOUNTER — Encounter: Payer: Self-pay | Admitting: Thoracic Surgery (Cardiothoracic Vascular Surgery)

## 2022-09-18 ENCOUNTER — Ambulatory Visit: Payer: Medicare HMO | Admitting: Thoracic Surgery (Cardiothoracic Vascular Surgery)

## 2022-09-21 ENCOUNTER — Encounter: Payer: Self-pay | Admitting: Thoracic Surgery (Cardiothoracic Vascular Surgery)

## 2022-09-25 ENCOUNTER — Ambulatory Visit: Payer: Medicare HMO | Admitting: Thoracic Surgery (Cardiothoracic Vascular Surgery)

## 2022-09-25 ENCOUNTER — Other Ambulatory Visit: Payer: Medicare HMO

## 2022-10-01 ENCOUNTER — Ambulatory Visit
Admission: RE | Admit: 2022-10-01 | Discharge: 2022-10-01 | Disposition: A | Discharge status: Home / Self Care | Payer: Medicare HMO | Source: Ambulatory Visit | Attending: Thoracic Surgery (Cardiothoracic Vascular Surgery) | Admitting: Thoracic Surgery (Cardiothoracic Vascular Surgery)

## 2022-10-01 DIAGNOSIS — R918 Other nonspecific abnormal finding of lung field: Secondary | ICD-10-CM

## 2022-10-09 ENCOUNTER — Encounter: Payer: Self-pay | Admitting: *Deleted

## 2022-10-09 ENCOUNTER — Ambulatory Visit: Payer: Medicare HMO | Admitting: Thoracic Surgery (Cardiothoracic Vascular Surgery)

## 2022-10-09 ENCOUNTER — Other Ambulatory Visit: Payer: Self-pay | Admitting: Thoracic Surgery (Cardiothoracic Vascular Surgery)

## 2022-10-09 ENCOUNTER — Other Ambulatory Visit: Payer: Self-pay | Admitting: *Deleted

## 2022-10-09 VITALS — BP 161/72 | HR 70 | Resp 20 | Ht 63.0 in | Wt 150.0 lb

## 2022-10-09 DIAGNOSIS — R918 Other nonspecific abnormal finding of lung field: Secondary | ICD-10-CM

## 2022-10-09 NOTE — Progress Notes (Signed)
PCP is Leilani Able, MD Referring Provider is Leilani Able, MD  Chief Complaint  Patient presents with   Lung Lesion    CT chest 4/22    HPI: Audrey Green returns for follow-up of multiple lung nodules.  Audrey Green is a 77 year old woman with a past history significant for bilateral lung nodules, remote tobacco use (quit 15 years ago), hypertension, hyperlipidemia, aortic atherosclerosis, heart murmur, insulin-dependent type 2 diabetes, stage IV chronic kidney disease, hepatitis C, hepatic fibrosis, and osteoporosis.  Audrey Green has been followed for many years for bilateral groundglass opacities.  Initially by Dr. Edwyna Shell and then by Dr. Tyrone Sage.  Audrey Green last saw Dr. Tyrone Sage in 2020.  Audrey Green has been feeling well.  Denies any chest pain, pressure, or tightness either at rest or with exertion.  He is short of breath with heavy exertion but not with normal activities.  No cough or wheezing.  No change in appetite or weight loss.  Zubrod Score: At the time of surgery this patient's most appropriate activity status/level should be described as: [x]     0    Normal activity, no symptoms []     1    Restricted in physical strenuous activity but ambulatory, able to do out light work []     2    Ambulatory and capable of self care, unable to do work activities, up and about >50 % of waking hours                              []     3    Only limited self care, in bed greater than 50% of waking hours []     4    Completely disabled, no self care, confined to bed or chair []     5    Moribund  Past Medical History:  Diagnosis Date   Allergy    Chronic kidney disease    stage 3   Complication of anesthesia    Diabetes mellitus    Fibroids    GERD (gastroesophageal reflux disease)    Goiter    Hepatitis    from blood transfusion, was treated in 2014   History of blood transfusion    History of colon polyps    Hyperlipidemia    Hypertension    Osteoporosis    Pancreatitis    PONV (postoperative nausea and  vomiting)    years ago   Seasonal allergies     Past Surgical History:  Procedure Laterality Date   ABDOMINAL HYSTERECTOMY  1977   CARPAL TUNNEL RELEASE Bilateral    CHOLECYSTECTOMY     COLONOSCOPY W/ POLYPECTOMY     HAND SURGERY     LUMBAR LAMINECTOMY/DECOMPRESSION MICRODISCECTOMY Left 06/24/2014   Procedure: Left Lumbar four-five microdiskectomy;  Surgeon: Coletta Memos, MD;  Location: [facility];  Service: Neurosurgery;  Laterality: Left;  Left Lumbar four-five microdiskectomy   TONSILLECTOMY     TONSILLECTOMY      Family History  Problem Relation Age of Onset   Breast cancer Mother    Diabetes Mother    Heart disease Mother    Colon cancer Neg Hx     Social History Social History   Tobacco Use   Smoking status: Former    Years: 11    Types: Cigarettes    Quit date: 06/11/2008    Years since quitting: 14.3   Smokeless tobacco: Never  Substance Use Topics   Alcohol use: No  Drug use: No    Current Outpatient Medications  Medication Sig Dispense Refill   aspirin EC 81 MG tablet Take 81 mg by mouth daily.     cetirizine (ZYRTEC) 10 MG tablet Take 10 mg by mouth at bedtime.      Cholecalciferol (VITAMIN D-1000 MAX ST) 1000 units tablet Take by mouth.     Continuous Glucose Sensor (DEXCOM G7 SENSOR) MISC Use 1 each every 10 (ten) days to check blood sugars 6 times per day     esomeprazole (NEXIUM) 40 MG capsule TAKE 1 CAPSULE (40MG  TOTAL ) BY MOUTH 2 TIMES DAILY (Patient taking differently: Take 40 mg by mouth 2 (two) times daily before a meal.) 180 capsule 1   FARXIGA 10 MG TABS tablet Take 10 mg by mouth daily.     fenofibrate (TRICOR) 145 MG tablet TAKE 1 TABLET (145 MG) BY ORAL ROUTE ONCE DAILY  4   FLUAD 0.5 ML SUSY TO BE ADMINISTERED BY PHARMACIST FOR IMMUNIZATION  0   fluticasone (FLONASE) 50 MCG/ACT nasal spray USE 2 SPRAYS IN EACH NOSTRIL DAILY (Patient taking differently: Place 2 sprays into both nostrils at bedtime.) 48 g 1   glucose blood (ONE TOUCH ULTRA  TEST) test strip Check blood sugar twice daily and as directed. Dx 250.4 (Patient taking differently: Check blood sugar three times daily and as directed. Dx 250.4) 100 each 5   hydrOXYzine (ATARAX/VISTARIL) 25 MG tablet Take 25 mg by mouth 4 (four) times daily. Started 4 months ago     insulin aspart (NOVOLOG FLEXPEN) 100 UNIT/ML FlexPen Inject into the skin 3 (three) times daily with meals. 10-15 units TID with meals     Insulin Pen Needle (ULTICARE MINI PEN NEEDLES) 31G X 6 MM MISC Use to inject insulin daily 200 each 1   isosorbide mononitrate (IMDUR) 30 MG 24 hr tablet Take 30 mg by mouth daily.  3   Liraglutide (VICTOZA) 18 MG/3ML SOPN Inject 1.8 mg into the skin daily.     lisinopril-hydrochlorothiazide (PRINZIDE,[name]) 20-25 MG tablet Take 1 tablet by mouth daily.  3   Magnesium 200 MG TABS Take by mouth daily.     meloxicam (MOBIC) 15 MG tablet Take 1 tablet (15 mg total) by mouth daily. 60 tablet 1   Multiple Vitamin (MULITIVITAMIN WITH MINERALS) TABS Take 1 tablet by mouth daily. Centrum Silver     ONE TOUCH LANCETS MISC Use to check blood sugar twice a day     OZEMPIC, 1 MG/DOSE, 4 MG/3ML SOPN Inject into the skin.     ranitidine (ZANTAC) 300 MG capsule Take 1 capsule (300 mg total) by mouth 2 (two) times daily. 180 capsule 1   TRESIBA FLEXTOUCH 200 UNIT/ML SOPN INJECT 46 UNITS BY SUBCUTANEOUS ROUTE 1 TIME PER DAY EVERY EVENING  3   No current facility-administered medications for this visit.    Allergies  Allergen Reactions   Atorvastatin     Other reaction(s): Muscle Pain   Codeine Hives   Penicillins Hives and Swelling    Tongue and throat swelling  Has patient had a PCN reaction causing immediate rash, facial/tongue/throat swelling, SOB or lightheadedness with hypotension: Yes Has patient had a PCN reaction causing severe rash involving mucus membranes or skin necrosis: Yes Has patient had a PCN reaction that required hospitalization Yes Has patient had a PCN  reaction occurring within the last 10 years: No If all of the above answers are "NO", then may proceed with Cephalosporin use.   Sulfa Antibiotics  Hives    Review of Systems  Constitutional:  Negative for activity change, appetite change, chills, fever and unexpected weight change.  HENT:  Negative for trouble swallowing and voice change.   Respiratory:  Negative for shortness of breath.   Cardiovascular:  Negative for chest pain and leg swelling.  Gastrointestinal:  Negative for abdominal distention and abdominal pain.  Genitourinary:  Negative for difficulty urinating and dysuria.  Neurological:  Negative for syncope and weakness.  Hematological:  Negative for adenopathy. Does not bruise/bleed easily.  All other systems reviewed and are negative.   BP (!) 161/72 (BP Location: Left Arm, Patient Position: Sitting)   Pulse 70   Resp 20   Ht 5\' 3"  (1.6 m)   Wt 150 lb (68 kg)   SpO2 97% Comment: RA  BMI 26.57 kg/m  Physical Exam Vitals reviewed.  Constitutional:      General: Audrey Green is not in acute distress.    Appearance: Normal appearance.  HENT:     Head: Normocephalic and atraumatic.  Eyes:     General: No scleral icterus.    Extraocular Movements: Extraocular movements intact.  Neck:     Vascular: No carotid bruit.  Cardiovascular:     Rate and Rhythm: Normal rate.     Heart sounds: Murmur (2/6 systolic) heard.  Pulmonary:     Effort: Pulmonary effort is normal. No respiratory distress.     Breath sounds: Normal breath sounds. No wheezing or rales.  Abdominal:     General: There is no distension.     Palpations: Abdomen is soft.  Lymphadenopathy:     Cervical: No cervical adenopathy.  Skin:    General: Skin is warm and dry.  Neurological:     General: No focal deficit present.     Mental Status: Audrey Green is alert and oriented to person, place, and time.     Cranial Nerves: No cranial nerve deficit.     Motor: No weakness.      Diagnostic Tests: Echocardiogram  04/13/2021 Normal left-ventricular function (EF greater than 55%) with moderate LVH, no valvular stenosis, mild MR, TR, and PR.  CT CHEST WITHOUT CONTRAST   TECHNIQUE: Multidetector CT imaging of the chest was performed following the standard protocol without IV contrast.   RADIATION DOSE REDUCTION: This exam was performed according to the departmental dose-optimization program which includes automated exposure control, adjustment of the mA and/or kV according to patient size and/or use of iterative reconstruction technique.   COMPARISON:  Chest CT 04/23/2019 and 04/17/2018.   FINDINGS: Cardiovascular: Atherosclerosis of the aorta, great vessels and coronary arteries. Aberrant retroesophageal right subclavian artery again noted. No acute vascular findings on noncontrast imaging. The heart size is normal. There is no pericardial effusion.   Mediastinum/Nodes: There are no enlarged mediastinal, hilar or axillary lymph nodes.Hilar assessment is limited by the lack of intravenous contrast, although the hilar contours appear unchanged. The thyroid gland, trachea and esophagus demonstrate no significant findings.   Lungs/Pleura: No pleural effusion or pneumothorax. The part solid nodule posteriorly in the right upper lobe demonstrates interval mild enlargement, measuring 2.0 x 1.3 cm on image 33/8 (previously 1.3 x 1.0. cm) and demonstrates a small solid component measuring up to 9 mm on image 31/8. The part solid nodule peripherally in the right lower lobe does not appear significantly changed on the axial images, measuring 1.3 x 0.6 cm on image 75/8 although does appear slightly larger on the sagittal images, extending up to 1.6 cm on image 40/6 (  previously 1.2 cm). Left upper lobe ground-glass nodule measuring 0.9 cm on image 21/8 is stable. There are additional smaller appear ground-glass nodules bilaterally which are unchanged.   Upper abdomen: Possible nonobstructing calculi in  the upper pole of the right kidney. No acute findings.   Musculoskeletal/Chest wall: There is no chest wall mass or suspicious osseous finding. Stable mild spondylosis.   IMPRESSION: 1. Interval enlargement of part solid nodule posteriorly in the right upper lobe, now with a small solid component, suspicious for low-grade adenocarcinoma. Consider PET-CT. 2. The part solid nodule peripherally in the right lower lobe does not appear significantly changed on the axial images, although does appear slightly larger on the sagittal images. 3. Additional small ground-glass nodules bilaterally are unchanged. 4. No adenopathy or pleural effusion. 5.  Aortic Atherosclerosis (ICD10-I70.0).     Electronically Signed   By: Carey Bullocks M.D.   On: 10/01/2022 16:18   I personally reviewed the CT images.  There has been progression of the posterior right upper lobe nodule.  Now with denser and with a small solid component.  Possible slight increase in size in the right lower lobe nodule.  Other groundglass opacities stable.  No adenopathy.  Aortic atherosclerosis.  Impression: Audrey Green is a 77 year old woman with a past history significant for bilateral lung nodules, remote tobacco use (quit 15 years ago), hypertension, hyperlipidemia, aortic atherosclerosis, heart murmur, insulin-dependent type 2 diabetes, stage IV chronic kidney disease, hepatitis C, hepatic fibrosis, and osteoporosis.  Multiple lung nodules-has been years since her last scan.  In the interim there has been an increase in the size and density of a nodule in the posterior right upper lobe.  More subtle changes involving a lower lobe nodule that is more cylindrical in shape.  Other nodules bilaterally remained stable.  We discussed the concern that this could represent a low-grade adenocarcinoma that has now become invasive.  There is a small solid component with the lesion.  In my opinion this nodule should be removed.  The other  option besides surgical resection would be to do a navigational bronchoscopy for biopsy and then refer for stereotactic radiation.  Audrey Green is a surgical candidate and that is the best treatment option both for diagnosis and treatment.  I recommended we do a navigational bronchoscopy to mark the upper and lower lobe nodules and then a robotic assisted VATS for wedge resection of the 2 nodules.  These both are slow-growing peripheral nodules that are suitable for wedge resection for both diagnosis and treatment.  I informed her of the general nature of the procedure including the need for general anesthesia, the incisions to be used, the use of the surgical robot, the use of drains to postoperatively, the expected hospital stay, and the overall recovery.  I informed her of the indications, risks, benefits, and alternatives.  Audrey Green understand the risks include, but not limited to death, MI, DVT, PE, bleeding, possible need for transfusion, infection, cardiac arrhythmias, prolonged air leak, as well as possibility of other unforeseeable complications.  Audrey Green wishes to proceed with surgical resection.  Plan: Navigational bronchoscopy for marking of nodules followed by robotic assisted right VATS for upper and lower lobe wedge resections on Thursday, 10/25/2022.  I spent over 50 minutes in review of records, images, and in consultation with Ms. Zammit today. Loreli Slot, MD [facility] [phone]

## 2022-10-09 NOTE — H&P (View-Only) (Signed)
PCP is Reese, Betti, MD Referring Provider is Reese, Betti, MD  Chief Complaint  Patient presents with   Lung Lesion    CT chest 4/22    HPI: Ms. Heaphy returns for follow-up of multiple lung nodules.  Audrey Green is a 77-year-old woman with a past history significant for bilateral lung nodules, remote tobacco use (quit 15 years ago), hypertension, hyperlipidemia, aortic atherosclerosis, heart murmur, insulin-dependent type 2 diabetes, stage IV chronic kidney disease, hepatitis C, hepatic fibrosis, and osteoporosis.  She has been followed for many years for bilateral groundglass opacities.  Initially by Dr. Burney and then by Dr. Gerhardt.  She last saw Dr. Gerhardt in 2020.  She has been feeling well.  Denies any chest pain, pressure, or tightness either at rest or with exertion.  He is short of breath with heavy exertion but not with normal activities.  No cough or wheezing.  No change in appetite or weight loss.  Zubrod Score: At the time of surgery this patient's most appropriate activity status/level should be described as: [x]    0    Normal activity, no symptoms []    1    Restricted in physical strenuous activity but ambulatory, able to do out light work []    2    Ambulatory and capable of self care, unable to do work activities, up and about >50 % of waking hours                              []    3    Only limited self care, in bed greater than 50% of waking hours []    4    Completely disabled, no self care, confined to bed or chair []    5    Moribund  Past Medical History:  Diagnosis Date   Allergy    Chronic kidney disease    stage 3   Complication of anesthesia    Diabetes mellitus    Fibroids    GERD (gastroesophageal reflux disease)    Goiter    Hepatitis    from blood transfusion, was treated in 2014   History of blood transfusion    History of colon polyps    Hyperlipidemia    Hypertension    Osteoporosis    Pancreatitis    PONV (postoperative nausea and  vomiting)    years ago   Seasonal allergies     Past Surgical History:  Procedure Laterality Date   ABDOMINAL HYSTERECTOMY  1977   CARPAL TUNNEL RELEASE Bilateral    CHOLECYSTECTOMY     COLONOSCOPY W/ POLYPECTOMY     HAND SURGERY     LUMBAR LAMINECTOMY/DECOMPRESSION MICRODISCECTOMY Left 06/24/2014   Procedure: Left Lumbar four-five microdiskectomy;  Surgeon: Kyle Cabbell, MD;  Location: [facility];  Service: Neurosurgery;  Laterality: Left;  Left Lumbar four-five microdiskectomy   TONSILLECTOMY     TONSILLECTOMY      Family History  Problem Relation Age of Onset   Breast cancer Mother    Diabetes Mother    Heart disease Mother    Colon cancer Neg Hx     Social History Social History   Tobacco Use   Smoking status: Former    Years: 11    Types: Cigarettes    Quit date: 06/11/2008    Years since quitting: 14.3   Smokeless tobacco: Never  Substance Use Topics   Alcohol use: No     Drug use: No    Current Outpatient Medications  Medication Sig Dispense Refill   aspirin EC 81 MG tablet Take 81 mg by mouth daily.     cetirizine (ZYRTEC) 10 MG tablet Take 10 mg by mouth at bedtime.      Cholecalciferol (VITAMIN D-1000 MAX ST) 1000 units tablet Take by mouth.     Continuous Glucose Sensor (DEXCOM G7 SENSOR) MISC Use 1 each every 10 (ten) days to check blood sugars 6 times per day     esomeprazole (NEXIUM) 40 MG capsule TAKE 1 CAPSULE (40MG TOTAL ) BY MOUTH 2 TIMES DAILY (Patient taking differently: Take 40 mg by mouth 2 (two) times daily before a meal.) 180 capsule 1   FARXIGA 10 MG TABS tablet Take 10 mg by mouth daily.     fenofibrate (TRICOR) 145 MG tablet TAKE 1 TABLET (145 MG) BY ORAL ROUTE ONCE DAILY  4   FLUAD 0.5 ML SUSY TO BE ADMINISTERED BY PHARMACIST FOR IMMUNIZATION  0   fluticasone (FLONASE) 50 MCG/ACT nasal spray USE 2 SPRAYS IN EACH NOSTRIL DAILY (Patient taking differently: Place 2 sprays into both nostrils at bedtime.) 48 g 1   glucose blood (ONE TOUCH ULTRA  TEST) test strip Check blood sugar twice daily and as directed. Dx 250.4 (Patient taking differently: Check blood sugar three times daily and as directed. Dx 250.4) 100 each 5   hydrOXYzine (ATARAX/VISTARIL) 25 MG tablet Take 25 mg by mouth 4 (four) times daily. Started 4 months ago     insulin aspart (NOVOLOG FLEXPEN) 100 UNIT/ML FlexPen Inject into the skin 3 (three) times daily with meals. 10-15 units TID with meals     Insulin Pen Needle (ULTICARE MINI PEN NEEDLES) 31G X 6 MM MISC Use to inject insulin daily 200 each 1   isosorbide mononitrate (IMDUR) 30 MG 24 hr tablet Take 30 mg by mouth daily.  3   Liraglutide (VICTOZA) 18 MG/3ML SOPN Inject 1.8 mg into the skin daily.     lisinopril-hydrochlorothiazide (PRINZIDE,[name]) 20-25 MG tablet Take 1 tablet by mouth daily.  3   Magnesium 200 MG TABS Take by mouth daily.     meloxicam (MOBIC) 15 MG tablet Take 1 tablet (15 mg total) by mouth daily. 60 tablet 1   Multiple Vitamin (MULITIVITAMIN WITH MINERALS) TABS Take 1 tablet by mouth daily. Centrum Silver     ONE TOUCH LANCETS MISC Use to check blood sugar twice a day     OZEMPIC, 1 MG/DOSE, 4 MG/3ML SOPN Inject into the skin.     ranitidine (ZANTAC) 300 MG capsule Take 1 capsule (300 mg total) by mouth 2 (two) times daily. 180 capsule 1   TRESIBA FLEXTOUCH 200 UNIT/ML SOPN INJECT 46 UNITS BY SUBCUTANEOUS ROUTE 1 TIME PER DAY EVERY EVENING  3   No current facility-administered medications for this visit.    Allergies  Allergen Reactions   Atorvastatin     Other reaction(s): Muscle Pain   Codeine Hives   Penicillins Hives and Swelling    Tongue and throat swelling  Has patient had a PCN reaction causing immediate rash, facial/tongue/throat swelling, SOB or lightheadedness with hypotension: Yes Has patient had a PCN reaction causing severe rash involving mucus membranes or skin necrosis: Yes Has patient had a PCN reaction that required hospitalization Yes Has patient had a PCN  reaction occurring within the last 10 years: No If all of the above answers are "NO", then may proceed with Cephalosporin use.   Sulfa Antibiotics   Hives    Review of Systems  Constitutional:  Negative for activity change, appetite change, chills, fever and unexpected weight change.  HENT:  Negative for trouble swallowing and voice change.   Respiratory:  Negative for shortness of breath.   Cardiovascular:  Negative for chest pain and leg swelling.  Gastrointestinal:  Negative for abdominal distention and abdominal pain.  Genitourinary:  Negative for difficulty urinating and dysuria.  Neurological:  Negative for syncope and weakness.  Hematological:  Negative for adenopathy. Does not bruise/bleed easily.  All other systems reviewed and are negative.   BP (!) 161/72 (BP Location: Left Arm, Patient Position: Sitting)   Pulse 70   Resp 20   Ht 5' 3" (1.6 m)   Wt 150 lb (68 kg)   SpO2 97% Comment: RA  BMI 26.57 kg/m  Physical Exam Vitals reviewed.  Constitutional:      General: She is not in acute distress.    Appearance: Normal appearance.  HENT:     Head: Normocephalic and atraumatic.  Eyes:     General: No scleral icterus.    Extraocular Movements: Extraocular movements intact.  Neck:     Vascular: No carotid bruit.  Cardiovascular:     Rate and Rhythm: Normal rate.     Heart sounds: Murmur (2/6 systolic) heard.  Pulmonary:     Effort: Pulmonary effort is normal. No respiratory distress.     Breath sounds: Normal breath sounds. No wheezing or rales.  Abdominal:     General: There is no distension.     Palpations: Abdomen is soft.  Lymphadenopathy:     Cervical: No cervical adenopathy.  Skin:    General: Skin is warm and dry.  Neurological:     General: No focal deficit present.     Mental Status: She is alert and oriented to person, place, and time.     Cranial Nerves: No cranial nerve deficit.     Motor: No weakness.      Diagnostic Tests: Echocardiogram  04/13/2021 Normal left-ventricular function (EF greater than 55%) with moderate LVH, no valvular stenosis, mild MR, TR, and PR.  CT CHEST WITHOUT CONTRAST   TECHNIQUE: Multidetector CT imaging of the chest was performed following the standard protocol without IV contrast.   RADIATION DOSE REDUCTION: This exam was performed according to the departmental dose-optimization program which includes automated exposure control, adjustment of the mA and/or kV according to patient size and/or use of iterative reconstruction technique.   COMPARISON:  Chest CT 04/23/2019 and 04/17/2018.   FINDINGS: Cardiovascular: Atherosclerosis of the aorta, great vessels and coronary arteries. Aberrant retroesophageal right subclavian artery again noted. No acute vascular findings on noncontrast imaging. The heart size is normal. There is no pericardial effusion.   Mediastinum/Nodes: There are no enlarged mediastinal, hilar or axillary lymph nodes.Hilar assessment is limited by the lack of intravenous contrast, although the hilar contours appear unchanged. The thyroid gland, trachea and esophagus demonstrate no significant findings.   Lungs/Pleura: No pleural effusion or pneumothorax. The part solid nodule posteriorly in the right upper lobe demonstrates interval mild enlargement, measuring 2.0 x 1.3 cm on image 33/8 (previously 1.3 x 1.0. cm) and demonstrates a small solid component measuring up to 9 mm on image 31/8. The part solid nodule peripherally in the right lower lobe does not appear significantly changed on the axial images, measuring 1.3 x 0.6 cm on image 75/8 although does appear slightly larger on the sagittal images, extending up to 1.6 cm on image 40/6 (  previously 1.2 cm). Left upper lobe ground-glass nodule measuring 0.9 cm on image 21/8 is stable. There are additional smaller appear ground-glass nodules bilaterally which are unchanged.   Upper abdomen: Possible nonobstructing calculi in  the upper pole of the right kidney. No acute findings.   Musculoskeletal/Chest wall: There is no chest wall mass or suspicious osseous finding. Stable mild spondylosis.   IMPRESSION: 1. Interval enlargement of part solid nodule posteriorly in the right upper lobe, now with a small solid component, suspicious for low-grade adenocarcinoma. Consider PET-CT. 2. The part solid nodule peripherally in the right lower lobe does not appear significantly changed on the axial images, although does appear slightly larger on the sagittal images. 3. Additional small ground-glass nodules bilaterally are unchanged. 4. No adenopathy or pleural effusion. 5.  Aortic Atherosclerosis (ICD10-I70.0).     Electronically Signed   By: William  Veazey M.D.   On: 10/01/2022 16:18   I personally reviewed the CT images.  There has been progression of the posterior right upper lobe nodule.  Now with denser and with a small solid component.  Possible slight increase in size in the right lower lobe nodule.  Other groundglass opacities stable.  No adenopathy.  Aortic atherosclerosis.  Impression: Audrey Green is a 76-year-old woman with a past history significant for bilateral lung nodules, remote tobacco use (quit 15 years ago), hypertension, hyperlipidemia, aortic atherosclerosis, heart murmur, insulin-dependent type 2 diabetes, stage IV chronic kidney disease, hepatitis C, hepatic fibrosis, and osteoporosis.  Multiple lung nodules-has been years since her last scan.  In the interim there has been an increase in the size and density of a nodule in the posterior right upper lobe.  More subtle changes involving a lower lobe nodule that is more cylindrical in shape.  Other nodules bilaterally remained stable.  We discussed the concern that this could represent a low-grade adenocarcinoma that has now become invasive.  There is a small solid component with the lesion.  In my opinion this nodule should be removed.  The other  option besides surgical resection would be to do a navigational bronchoscopy for biopsy and then refer for stereotactic radiation.  She is a surgical candidate and that is the best treatment option both for diagnosis and treatment.  I recommended we do a navigational bronchoscopy to mark the upper and lower lobe nodules and then a robotic assisted VATS for wedge resection of the 2 nodules.  These both are slow-growing peripheral nodules that are suitable for wedge resection for both diagnosis and treatment.  I informed her of the general nature of the procedure including the need for general anesthesia, the incisions to be used, the use of the surgical robot, the use of drains to postoperatively, the expected hospital stay, and the overall recovery.  I informed her of the indications, risks, benefits, and alternatives.  She understand the risks include, but not limited to death, MI, DVT, PE, bleeding, possible need for transfusion, infection, cardiac arrhythmias, prolonged air leak, as well as possibility of other unforeseeable complications.  She wishes to proceed with surgical resection.  Plan: Navigational bronchoscopy for marking of nodules followed by robotic assisted right VATS for upper and lower lobe wedge resections on Thursday, 10/25/2022.  I spent over 50 minutes in review of records, images, and in consultation with Ms. Mersereau today. Toris Laverdiere C Franciso Dierks, MD [facility] [phone]  

## 2022-10-22 NOTE — Pre-Procedure Instructions (Signed)
Surgical Instructions    Your procedure is scheduled on Oct 25, 2022.  Report to [facility] Main Entrance "A" at 5:30 A.M., then check in with the Admitting office.  Call this number if you have problems the morning of surgery:  [phone]  If you have any questions prior to your surgery date call [phone]: Open Monday-Friday 8am-4pm If you experience any cold or flu symptoms such as cough, fever, chills, shortness of breath, etc. between now and your scheduled surgery, please notify us at the above number.     Remember:  Do not eat or drink after midnight the night before your surgery     Take these medicines the morning of surgery with A SIP OF WATER:  diltiazem (CARDIZEM CD)   esomeprazole (NEXIUM)   fenofibrate (TRICOR)   hydrOXYzine (ATARAX/VISTARIL)   isosorbide mononitrate (IMDUR)   pantoprazole (PROTONIX)   fluticasone (FLONASE) nasal spray - may take if needed   As of today, STOP taking any Aleve, Naproxen, Ibuprofen, Motrin, Advil, Goody's, BC's, all herbal medications, fish oil, and all vitamins. This includes your medication: meloxicam (MOBIC)   WHAT DO I DO ABOUT MY DIABETES MEDICATION?   STOP taking FARXIGA three days prior to surgery. Your last dose will be May 12th.  STOP taking your OZEMPIC one week prior to surgery.  NIGHT BEFORE SURGERY, only take 22 units of your TRESIBA FLEXTOUCH insulin.  If your CBG is greater than 220 mg/dL morning of surgery, you may take  of your sliding scale (correction) dose of insulin aspart (NOVOLOG FLEXPEN).   HOW TO MANAGE YOUR DIABETES BEFORE AND AFTER SURGERY  Why is it important to control my blood sugar before and after surgery? Improving blood sugar levels before and after surgery helps healing and can limit problems. A way of improving blood sugar control is eating a healthy diet by:  Eating less sugar and carbohydrates  Increasing activity/exercise  Talking with your doctor about reaching your blood sugar  goals High blood sugars (greater than 180 mg/dL) can raise your risk of infections and slow your recovery, so you will need to focus on controlling your diabetes during the weeks before surgery. Make sure that the doctor who takes care of your diabetes knows about your planned surgery including the date and location.  How do I manage my blood sugar before surgery? Check your blood sugar at least 4 times a day, starting 2 days before surgery, to make sure that the level is not too high or low.  Check your blood sugar the morning of your surgery when you wake up and every 2 hours until you get to the Short Stay unit.  If your blood sugar is less than 70 mg/dL, you will need to treat for low blood sugar: Do not take insulin. Treat a low blood sugar (less than 70 mg/dL) with  cup of clear juice (cranberry or apple), 4 glucose tablets, OR glucose gel. Recheck blood sugar in 15 minutes after treatment (to make sure it is greater than 70 mg/dL). If your blood sugar is not greater than 70 mg/dL on recheck, call [phone] for further instructions. Report your blood sugar to the short stay nurse when you get to Short Stay.  If you are admitted to the hospital after surgery: Your blood sugar will be checked by the staff and you will probably be given insulin after surgery (instead of oral diabetes medicines) to make sure you have good blood sugar levels. The goal for blood sugar control  after surgery is 80-180 mg/dL.                      Do NOT Smoke (Tobacco/Vaping) for 24 hours prior to your procedure.  If you use a CPAP at night, you may bring your mask/headgear for your overnight stay.   Contacts, glasses, piercing's, hearing aid's, dentures or partials may not be worn into surgery, please bring cases for these belongings.    For patients admitted to the hospital, discharge time will be determined by your treatment team.   Patients discharged the day of surgery will not be allowed to drive  home, and someone needs to stay with them for 24 hours.  SURGICAL WAITING ROOM VISITATION Patients having surgery or a procedure may have no more than 2 support people in the waiting area - these visitors may rotate.   Children under the age of 30 must have an adult with them who is not the patient. If the patient needs to stay at the hospital during part of their recovery, the visitor guidelines for inpatient rooms apply. Pre-op nurse will coordinate an appropriate time for 1 support person to accompany patient in pre-op.  This support person may not rotate.   Please refer to the [facility] website for the visitor guidelines for Inpatients (after your surgery is over and you are in a regular room).    Special instructions:   [facility]- Preparing For Surgery  Before surgery, you can play an important role. Because skin is not sterile, your skin needs to be as free of germs as possible. You can reduce the number of germs on your skin by washing with CHG (chlorahexidine gluconate) Soap before surgery.  CHG is an antiseptic cleaner which kills germs and bonds with the skin to continue killing germs even after washing.    Oral Hygiene is also important to reduce your risk of infection.  Remember - BRUSH YOUR TEETH THE MORNING OF SURGERY WITH YOUR REGULAR TOOTHPASTE  Please do not use if you have an allergy to CHG or antibacterial soaps. If your skin becomes reddened/irritated stop using the CHG.  Do not shave (including legs and underarms) for at least 48 hours prior to first CHG shower. It is OK to shave your face.  Please follow these instructions carefully.   Shower the NIGHT BEFORE SURGERY and the MORNING OF SURGERY  If you chose to wash your hair, wash your hair first as usual with your normal shampoo.  After you shampoo, rinse your hair and body thoroughly to remove the shampoo.  Use CHG Soap as you would any other liquid soap. You can apply CHG directly to the skin and wash gently  with a scrungie or a clean washcloth.   Apply the CHG Soap to your body ONLY FROM THE NECK DOWN.  Do not use on open wounds or open sores. Avoid contact with your eyes, ears, mouth and genitals (private parts). Wash Face and genitals (private parts)  with your normal soap.   Wash thoroughly, paying special attention to the area where your surgery will be performed.  Thoroughly rinse your body with warm water from the neck down.  DO NOT shower/wash with your normal soap after using and rinsing off the CHG Soap.  Pat yourself dry with a CLEAN TOWEL.  Wear CLEAN PAJAMAS to bed the night before surgery  Place CLEAN SHEETS on your bed the night before your surgery  DO NOT SLEEP WITH PETS.   Day  of Surgery: Take a shower with CHG soap. Do not wear jewelry or makeup Do not wear lotions, powders, perfumes/colognes, or deodorant. Do not shave 48 hours prior to surgery.  Men may shave face and neck. Do not bring valuables to the hospital.  [facility] is not responsible for any belongings or valuables. Do not wear nail polish, gel polish, artificial nails, or any other type of covering on natural nails (fingers and toes) If you have artificial nails or gel coating that need to be removed by a nail salon, please have this removed prior to surgery. Artificial nails or gel coating may interfere with anesthesia's ability to adequately monitor your vital signs.  Wear Clean/Comfortable clothing the morning of surgery Remember to brush your teeth WITH YOUR REGULAR TOOTHPASTE.   Please read over the following fact sheets that you were given.    If you received a COVID test during your pre-op visit  it is requested that you wear a mask when out in public, stay away from anyone that may not be feeling well and notify your surgeon if you develop symptoms. If you have been in contact with anyone that has tested positive in the last 10 days please notify you surgeon.

## 2022-10-23 ENCOUNTER — Encounter (HOSPITAL_COMMUNITY): Payer: Self-pay

## 2022-10-23 ENCOUNTER — Ambulatory Visit (HOSPITAL_COMMUNITY)
Admission: RE | Admit: 2022-10-23 | Discharge: 2022-10-23 | Disposition: A | Discharge status: Home / Self Care | Payer: Medicare HMO | Source: Ambulatory Visit | Attending: Thoracic Surgery (Cardiothoracic Vascular Surgery) | Admitting: Thoracic Surgery (Cardiothoracic Vascular Surgery)

## 2022-10-23 ENCOUNTER — Encounter (HOSPITAL_COMMUNITY)
Admission: RE | Admit: 2022-10-23 | Discharge: 2022-10-23 | Disposition: A | Discharge status: Home / Self Care | Payer: Medicare HMO | Source: Ambulatory Visit | Attending: Thoracic Surgery (Cardiothoracic Vascular Surgery) | Admitting: Thoracic Surgery (Cardiothoracic Vascular Surgery)

## 2022-10-23 ENCOUNTER — Other Ambulatory Visit: Payer: Self-pay

## 2022-10-23 ENCOUNTER — Other Ambulatory Visit: Payer: Self-pay | Admitting: *Deleted

## 2022-10-23 VITALS — BP 136/70 | HR 73 | Temp 98.2°F | Resp 18 | Ht 63.0 in | Wt 147.7 lb

## 2022-10-23 DIAGNOSIS — R918 Other nonspecific abnormal finding of lung field: Secondary | ICD-10-CM

## 2022-10-23 DIAGNOSIS — E039 Hypothyroidism, unspecified: Secondary | ICD-10-CM | POA: Insufficient documentation

## 2022-10-23 DIAGNOSIS — Z01818 Encounter for other preprocedural examination: Secondary | ICD-10-CM

## 2022-10-23 DIAGNOSIS — E785 Hyperlipidemia, unspecified: Secondary | ICD-10-CM | POA: Insufficient documentation

## 2022-10-23 DIAGNOSIS — Z87891 Personal history of nicotine dependence: Secondary | ICD-10-CM | POA: Insufficient documentation

## 2022-10-23 DIAGNOSIS — E1122 Type 2 diabetes mellitus with diabetic chronic kidney disease: Secondary | ICD-10-CM | POA: Insufficient documentation

## 2022-10-23 DIAGNOSIS — N183 Chronic kidney disease, stage 3 unspecified: Secondary | ICD-10-CM | POA: Insufficient documentation

## 2022-10-23 DIAGNOSIS — E1140 Type 2 diabetes mellitus with diabetic neuropathy, unspecified: Secondary | ICD-10-CM | POA: Insufficient documentation

## 2022-10-23 DIAGNOSIS — I700 Atherosclerosis of aorta: Secondary | ICD-10-CM | POA: Insufficient documentation

## 2022-10-23 DIAGNOSIS — Z1152 Encounter for screening for COVID-19: Secondary | ICD-10-CM | POA: Insufficient documentation

## 2022-10-23 DIAGNOSIS — J439 Emphysema, unspecified: Secondary | ICD-10-CM | POA: Insufficient documentation

## 2022-10-23 DIAGNOSIS — I4891 Unspecified atrial fibrillation: Secondary | ICD-10-CM | POA: Insufficient documentation

## 2022-10-23 DIAGNOSIS — I129 Hypertensive chronic kidney disease with stage 1 through stage 4 chronic kidney disease, or unspecified chronic kidney disease: Secondary | ICD-10-CM | POA: Insufficient documentation

## 2022-10-23 DIAGNOSIS — E119 Type 2 diabetes mellitus without complications: Secondary | ICD-10-CM

## 2022-10-23 HISTORY — DX: Emphysema, unspecified: J43.9

## 2022-10-23 HISTORY — DX: Dyspnea, unspecified: R06.00

## 2022-10-23 HISTORY — DX: Unspecified osteoarthritis, unspecified site: M19.90

## 2022-10-23 HISTORY — DX: Atherosclerosis of aorta: I70.0

## 2022-10-23 HISTORY — DX: Cardiac murmur, unspecified: R01.1

## 2022-10-23 HISTORY — DX: Palpitations: R00.2

## 2022-10-23 HISTORY — DX: Unspecified atrial fibrillation: I48.91

## 2022-10-23 LAB — PULMONARY FUNCTION TEST
DL/VA % pred: 62 %
DL/VA: 2.57 ml/min/mmHg/L
DLCO unc % pred: 47 %
DLCO unc: 8.84 ml/min/mmHg
FEF 25-75 Post: 1.77 L/sec
FEF 25-75 Pre: 2.71 L/sec
FEF2575-%Change-Post: -34 %
FEF2575-%Pred-Post: 110 %
FEF2575-%Pred-Pre: 168 %
FEV1-%Change-Post: -8 %
FEV1-%Pred-Post: 87 %
FEV1-%Pred-Pre: 94 %
FEV1-Post: 1.75 L
FEV1-Pre: 1.91 L
FEV1FVC-%Change-Post: -1 %
FEV1FVC-%Pred-Pre: 114 %
FEV6-%Change-Post: -6 %
FEV6-%Pred-Post: 81 %
FEV6-%Pred-Pre: 87 %
FEV6-Post: 2.08 L
FEV6-Pre: 2.23 L
FEV6FVC-%Pred-Post: 105 %
FEV6FVC-%Pred-Pre: 105 %
FVC-%Change-Post: -6 %
FVC-%Pred-Post: 77 %
FVC-%Pred-Pre: 82 %
FVC-Post: 2.08 L
FVC-Pre: 2.23 L
Post FEV1/FVC ratio: 84 %
Post FEV6/FVC ratio: 100 %
Pre FEV1/FVC ratio: 86 %
Pre FEV6/FVC Ratio: 100 %
RV % pred: 58 %
RV: 1.32 L
TLC % pred: 76 %
TLC: 3.76 L

## 2022-10-23 LAB — URINALYSIS, ROUTINE W REFLEX MICROSCOPIC
Bacteria, UA: NONE SEEN
Bilirubin Urine: NEGATIVE
Glucose, UA: >=500 mg/dL — AB
Hgb urine dipstick: NEGATIVE
Ketones, ur: NEGATIVE mg/dL
Leukocytes,Ua: NEGATIVE
Nitrite: NEGATIVE
Protein, ur: NEGATIVE mg/dL
Specific Gravity, Urine: 1.009 (ref 1.005–1.030)
pH: 6 (ref 5.0–8.0)

## 2022-10-23 LAB — TYPE AND SCREEN
ABO/RH(D): O NEG
Antibody Screen: NEGATIVE

## 2022-10-23 LAB — BLOOD GAS, ARTERIAL
Acid-base deficit: 1.4 mmol/L (ref 0.0–2.0)
Bicarbonate: 22.8 mmol/L (ref 20.0–28.0)
Drawn by: 58793
O2 Saturation: 99.2 %
Patient temperature: 37
pCO2 arterial: 36 mmHg (ref 32–48)
pH, Arterial: 7.41 (ref 7.35–7.45)
pO2, Arterial: 112 mmHg — ABNORMAL HIGH (ref 83–108)

## 2022-10-23 LAB — CBC
HCT: 41.1 % (ref 36.0–46.0)
Hemoglobin: 12.8 g/dL (ref 12.0–15.0)
MCH: 26.8 pg (ref 26.0–34.0)
MCHC: 31.1 g/dL (ref 30.0–36.0)
MCV: 86 fL (ref 80.0–100.0)
Platelets: 365 10*3/uL (ref 150–400)
RBC: 4.78 MIL/uL (ref 3.87–5.11)
RDW: 12.9 % (ref 11.5–15.5)
WBC: 6.3 10*3/uL (ref 4.0–10.5)
nRBC: 0 % (ref 0.0–0.2)

## 2022-10-23 LAB — COMPREHENSIVE METABOLIC PANEL
ALT: 22 U/L (ref 0–44)
AST: 23 U/L (ref 15–41)
Albumin: 3.8 g/dL (ref 3.5–5.0)
Alkaline Phosphatase: 64 U/L (ref 38–126)
Anion gap: 12 (ref 5–15)
BUN: 25 mg/dL — ABNORMAL HIGH (ref 8–23)
CO2: 20 mmol/L — ABNORMAL LOW (ref 22–32)
Calcium: 8.9 mg/dL (ref 8.9–10.3)
Chloride: 103 mmol/L (ref 98–111)
Creatinine, Ser: 2.06 mg/dL — ABNORMAL HIGH (ref 0.44–1.00)
GFR, Estimated: 25 mL/min — ABNORMAL LOW (ref >60)
Glucose, Bld: 106 mg/dL — ABNORMAL HIGH (ref 70–99)
Potassium: 4.7 mmol/L (ref 3.5–5.1)
Sodium: 135 mmol/L (ref 135–145)
Total Bilirubin: 0.4 mg/dL (ref 0.3–1.2)
Total Protein: 7.2 g/dL (ref 6.5–8.1)

## 2022-10-23 LAB — APTT: aPTT: 35 seconds (ref 24–36)

## 2022-10-23 LAB — SURGICAL PCR SCREEN
MRSA, PCR: NEGATIVE
Staphylococcus aureus: NEGATIVE

## 2022-10-23 LAB — PROTIME-INR
INR: 1 (ref 0.8–1.2)
Prothrombin Time: 13.4 seconds (ref 11.4–15.2)

## 2022-10-23 LAB — GLUCOSE, CAPILLARY: Glucose-Capillary: 171 mg/dL — ABNORMAL HIGH (ref 70–99)

## 2022-10-23 LAB — HEMOGLOBIN A1C
Hgb A1c MFr Bld: 7.4 % — ABNORMAL HIGH (ref 4.8–5.6)
Mean Plasma Glucose: 165.68 mg/dL

## 2022-10-23 MED ORDER — ALBUTEROL SULFATE (2.5 MG/3ML) 0.083% IN NEBU
2.5000 mg | INHALATION_SOLUTION | Freq: Once | RESPIRATORY_TRACT | Status: AC
  Administered 2022-10-23: 2.5 mg via RESPIRATORY_TRACT

## 2022-10-23 NOTE — Anesthesia Preprocedure Evaluation (Signed)
Anesthesia Evaluation  Patient identified by MRN, date of birth, ID band Patient awake    Reviewed: Allergy & Precautions, H&P , NPO status , Patient's Chart, lab work & pertinent test results  History of Anesthesia Complications (+) PONV and history of anesthetic complications  Airway Mallampati: III  TM Distance: >3 FB Neck ROM: Full    Dental no notable dental hx. (+) Teeth Intact, Dental Advisory Given   Pulmonary shortness of breath, COPD, former smoker   Pulmonary exam normal breath sounds clear to auscultation       Cardiovascular Exercise Tolerance: Good hypertension, Pt. on medications  Rhythm:Regular Rate:Normal     Neuro/Psych negative neurological ROS  negative psych ROS   GI/Hepatic Neg liver ROS,GERD  Medicated,,  Endo/Other  diabetes, Type 2, Insulin Dependent, Oral Hypoglycemic Agents    Renal/GU Renal InsufficiencyRenal disease  negative genitourinary   Musculoskeletal  (+) Arthritis , Osteoarthritis,    Abdominal   Peds  Hematology negative hematology ROS (+)   Anesthesia Other Findings   Reproductive/Obstetrics negative OB ROS                             Anesthesia Physical Anesthesia Plan  ASA: 3  Anesthesia Plan: General   Post-op Pain Management: Tylenol PO (pre-op)*   Induction: Intravenous  PONV Risk Score and Plan: 4 or greater and Ondansetron and Dexamethasone  Airway Management Planned: Double Lumen EBT  Additional Equipment: Arterial line  Intra-op Plan:   Post-operative Plan: Extubation in OR  Informed Consent: I have reviewed the patients History and Physical, chart, labs and discussed the procedure including the risks, benefits and alternatives for the proposed anesthesia with the patient or authorized representative who has indicated his/her understanding and acceptance.     Dental advisory given  Plan Discussed with: CRNA  Anesthesia  Plan Comments: (PAT note written 10/23/2022 by Shonna Chock, PA-C.  )       Anesthesia Quick Evaluation

## 2022-10-23 NOTE — Progress Notes (Signed)
PCP - Dr. Leilani Able Cardiologist - Dr. Dorothyann Peng  PPM/ICD - denies   Chest x-ray - 10/23/22 EKG - 10/23/22 Stress Test - 03/30/20 ECHO - 04/13/21 Cardiac Cath - denies  Sleep Study - denies   Fasting Blood Sugar - 95-110 Checks Blood Sugar continuously   Last dose of GLP1 agonist-  5/11 (Pt states the med instructions were marked out on her letter, so she took her DM medications)  GLP1 instructions: hold 1 week (marked out on letter)  Blood Thinner Instructions: n/a Aspirin Instructions: hold on DOS  ERAS Protcol - no, NPO   COVID TEST- 10/23/22   Anesthesia review: yes, cardiac hx  Patient denies shortness of breath, fever, cough and chest pain at PAT appointment   All instructions explained to the patient, with a verbal understanding of the material. Patient agrees to go over the instructions while at home for a better understanding. Patient also instructed to wear a mask in public after being tested for COVID-19. The opportunity to ask questions was provided.

## 2022-10-23 NOTE — Progress Notes (Signed)
Anesthesia Chart Review:  Case: [id number] Date/Time: 10/25/22 0745   Procedures:      XI ROBOTIC ASSISTED THORACOSCOPY-RIGHT UPPER AND LOWER LOBE WEDGE RESECTIONS (Right: Chest)     VIDEO BRONCHOSCOPY WITH ENDOBRONCHIAL NAVIGATION   Anesthesia type: General   Pre-op diagnosis: RIGHT UPPER AND LOWER LOBE NODULES   Location: [facility] ROOM 10 / [facility]   Surgeons: Loreli Slot, MD       DISCUSSION: Patient is a 77 year old female scheduled for the above procedure. She has been followed by [facility] for many years for bilateral pulmonary nodules, last 11/1/220 with 1 year follow-up recommended, although next visit seen was on 10/09/22 with Dr. Dorris Fetch following 10/01/22 chest CT showing interval enlargement of RUL,now with a small solid component suspicious for low grade adenocarcinoma. Plan for "navigational bronchoscopy to mark the upper and lower lobe nodules and then a robotic assisted VATS for wedge resection of the 2 nodules. 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   Last cardiology visit with Dr. Juliann Pares was on 05/10/22 for one year follow-up. 04/2021 echo showed normal LV/RV systolic function, LVEF > 55%, mild MR/TR/TR. Non-ischemic stress test in 03/2020. Overall, felt stable from a CV standpoint with 1 year follow-up recommended.   Dr. Dorris Fetch classified her Zubrod Score as 0: normal activity, no symptoms.  10/23/22 labs showed A1c 7.4%, BUN 25, Creatinine 2.06, eGFR 25. This is consistent with results from 10/18/22 at [facility]. She is followed by nephrologist Dr.  Thedore Mins. 07/12/22 notes indicates her Creatinine has ranged ~ 2.0-2.3 from 2021-2023.   She misread Ozempic instructions, so last dose 10/20/22. Last dose Farxiga 10/23/22. Dr. Dorris Fetch is aware. Case is considered time sensitive given concern for cancer. I notified anesthesiologist Stoltzfus, Earl Lites, DO of this.   Anesthesia team to evaluate on the day of surgery. 10/23/22 CXR and COVID-19 test are still in process.    VS: BP 136/70   Pulse 73   Temp 36.8 C   Resp 18   Ht 5\' 3"  (1.6 m)   Wt 67 kg   SpO2 98%   BMI 26.16 kg/m    PROVIDERS: Leilani Able, MD is PCP  Dorothyann Peng, MD is cardiologist ([facility]) Mosetta Pigeon, MD is nephrologist [facility]) Carlena Sax, MD is endocrinologist [facility], [facility])   LABS: Labs reviewed: Acceptable for surgery. See DISCUSSION. (all labs ordered are listed, but only abnormal results are displayed)  Labs Reviewed  GLUCOSE, CAPILLARY - Abnormal; Notable for the following components:      Result Value   Glucose-Capillary 171 (*)    All other components within normal limits  COMPREHENSIVE METABOLIC PANEL - Abnormal; Notable for the following components:   CO2 20 (*)    Glucose, Bld 106 (*)    BUN 25 (*)    Creatinine, Ser 2.06 (*)    GFR, Estimated 25 (*)    All other components within normal limits  BLOOD GAS, ARTERIAL - Abnormal; Notable for the following components:   pO2, Arterial 112 (*)    All other components within normal limits  URINALYSIS, ROUTINE W REFLEX MICROSCOPIC - Abnormal; Notable for the following components:   Color, Urine STRAW (*)  Glucose, UA >=500 (*)    All other components within normal limits  HEMOGLOBIN A1C - Abnormal; Notable for the following components:   Hgb A1c MFr Bld 7.4 (*)    All other components within normal limits  SURGICAL PCR SCREEN  SARS CORONAVIRUS 2 (TAT 6-24 HRS)  CBC  PROTIME-INR  APTT  TYPE AND SCREEN     IMAGES: CXR 10/23/22: In  process.  CT Chest 10/01/22: IMPRESSION: 1. Interval enlargement of part solid nodule posteriorly in the right upper lobe, now with a small solid component, suspicious for low-grade adenocarcinoma. Consider PET-CT. 2. The part solid nodule peripherally in the right lower lobe does not appear significantly changed on the axial images, although does appear slightly larger on the sagittal images. 3. Additional small ground-glass nodules bilaterally are unchanged. 4. No adenopathy or pleural effusion. 5.  Aortic Atherosclerosis (ICD10-I70.0).   US Renal 07/27/22: IMPRESSION: The left kidney is smaller than normal measuring 8.4 cm. The study is otherwise normal.  Fibroscan 02/01/21 ([facility]): SUMMARY:  Well study was of poor quality it is unlikely that patient has clinically  significant advanced hepatic fibrosis as study only estimate F1 fibrosis.   She had no significant hepatic steatosis noted on FibroScan.    EKG: 10/23/22:  Sinus bradycardia at 56 bpm Cannot rule out Anterior infarct , age undetermined  CV: Echo 04/13/21 ([facility]): INTERPRETATION  NORMAL LEFT VENTRICULAR SYSTOLIC FUNCTION   WITH MODERATE LVH  NORMAL RIGHT VENTRICULAR SYSTOLIC FUNCTION  NO VALVULAR STENOSIS  MILD MR, TR, PR  EF >55%   Nuclear stress test 03/30/20 ([facility]): Normal myocardial perfusion scan no evidence of stress-induced  myocardial ischemia ejection fraction of 89% conclusion negative scan    Past Medical History:  Diagnosis Date   A-fib [facility])    Allergy    Aortic atherosclerosis (HCC)    Arthritis    Chronic kidney disease    stage 3   Complication of anesthesia    Diabetes mellitus    Dyspnea    with exertion   Fibroids    GERD (gastroesophageal reflux disease)    Goiter    Heart murmur    Hepatitis    from blood transfusion, was treated in 2014   History of blood transfusion    History of colon polyps    Hyperlipidemia    Hypertension    Osteoporosis    Palpitations     Pancreatitis    PONV (postoperative nausea and vomiting)    years ago   Pulmonary emphysema (HCC)    Seasonal allergies     Past Surgical History:  Procedure Laterality Date   ABDOMINAL HYSTERECTOMY  06/12/1975   CARPAL TUNNEL RELEASE Bilateral    CHOLECYSTECTOMY     COLONOSCOPY W/ POLYPECTOMY     LUMBAR LAMINECTOMY/DECOMPRESSION MICRODISCECTOMY Left 06/24/2014   Procedure: Left Lumbar four-five microdiskectomy;  Surgeon: Coletta Memos, MD;  Location: [facility];  Service: Neurosurgery;  Laterality: Left;  Left Lumbar four-five microdiskectomy   TONSILLECTOMY      MEDICATIONS:  aspirin EC 81 MG tablet   cetirizine (ZYRTEC) 10 MG tablet   Cholecalciferol (VITAMIN D-1000 MAX ST) 1000 units tablet   Continuous Glucose Sensor (DEXCOM G7 SENSOR) MISC   diltiazem (CARDIZEM CD) 120 MG 24 hr capsule   esomeprazole (NEXIUM) 40 MG capsule   FARXIGA 10 MG TABS tablet   fenofibrate (TRICOR) 145 MG tablet   fluticasone (FLONASE) 50 MCG/ACT nasal spray   glucose blood (ONE TOUCH ULTRA TEST) test  strip   hydrOXYzine (ATARAX/VISTARIL) 25 MG tablet   insulin aspart (NOVOLOG FLEXPEN) 100 UNIT/ML FlexPen   Insulin Pen Needle (ULTICARE MINI PEN NEEDLES) 31G X 6 MM MISC   isosorbide mononitrate (IMDUR) 30 MG 24 hr tablet   lisinopril-hydrochlorothiazide (PRINZIDE,[name]) 20-25 MG tablet   meloxicam (MOBIC) 15 MG tablet   Multiple Vitamin (MULITIVITAMIN WITH MINERALS) TABS   ONE TOUCH LANCETS MISC   OZEMPIC, 1 MG/DOSE, 4 MG/3ML SOPN   pantoprazole (PROTONIX) 40 MG tablet   pravastatin (PRAVACHOL) 20 MG tablet   ranitidine (ZANTAC) 300 MG capsule   TRESIBA FLEXTOUCH 200 UNIT/ML SOPN   No current facility-administered medications for this encounter.    Shonna Chock, PA-C Surgical Short Stay/Anesthesiology [facility] Phone [phone] [facility] Phone [phone] 10/23/2022 2:48 PM

## 2022-10-24 LAB — SARS CORONAVIRUS 2 (TAT 6-24 HRS): SARS Coronavirus 2: NEGATIVE

## 2022-10-25 ENCOUNTER — Inpatient Hospital Stay (HOSPITAL_COMMUNITY)
Admission: RE | Admit: 2022-10-25 | Discharge: 2022-11-01 | DRG: 163 | Disposition: A | Discharge status: Home / Self Care | Payer: Medicare HMO | Attending: Thoracic Surgery (Cardiothoracic Vascular Surgery) | Admitting: Thoracic Surgery (Cardiothoracic Vascular Surgery)

## 2022-10-25 ENCOUNTER — Other Ambulatory Visit: Payer: Self-pay

## 2022-10-25 ENCOUNTER — Encounter (HOSPITAL_COMMUNITY)
Admission: RE | Disposition: A | Discharge status: Home / Self Care | Payer: Self-pay | Source: Home / Self Care | Attending: Thoracic Surgery (Cardiothoracic Vascular Surgery)

## 2022-10-25 ENCOUNTER — Inpatient Hospital Stay (HOSPITAL_COMMUNITY): Payer: Medicare HMO

## 2022-10-25 ENCOUNTER — Inpatient Hospital Stay (HOSPITAL_COMMUNITY): Payer: Medicare HMO | Admitting: Vascular Surgery

## 2022-10-25 ENCOUNTER — Encounter (HOSPITAL_COMMUNITY): Payer: Self-pay | Admitting: Thoracic Surgery (Cardiothoracic Vascular Surgery)

## 2022-10-25 DIAGNOSIS — E119 Type 2 diabetes mellitus without complications: Secondary | ICD-10-CM

## 2022-10-25 DIAGNOSIS — Z885 Allergy status to narcotic agent status: Secondary | ICD-10-CM

## 2022-10-25 DIAGNOSIS — M810 Age-related osteoporosis without current pathological fracture: Secondary | ICD-10-CM | POA: Diagnosis present

## 2022-10-25 DIAGNOSIS — C3431 Malignant neoplasm of lower lobe, right bronchus or lung: Secondary | ICD-10-CM | POA: Diagnosis present

## 2022-10-25 DIAGNOSIS — R0902 Hypoxemia: Secondary | ICD-10-CM | POA: Diagnosis not present

## 2022-10-25 DIAGNOSIS — N184 Chronic kidney disease, stage 4 (severe): Secondary | ICD-10-CM | POA: Diagnosis present

## 2022-10-25 DIAGNOSIS — Z888 Allergy status to other drugs, medicaments and biological substances status: Secondary | ICD-10-CM

## 2022-10-25 DIAGNOSIS — J9811 Atelectasis: Secondary | ICD-10-CM | POA: Diagnosis not present

## 2022-10-25 DIAGNOSIS — C3411 Malignant neoplasm of upper lobe, right bronchus or lung: Secondary | ICD-10-CM | POA: Diagnosis present

## 2022-10-25 DIAGNOSIS — Z01818 Encounter for other preprocedural examination: Secondary | ICD-10-CM | POA: Diagnosis not present

## 2022-10-25 DIAGNOSIS — Z880 Allergy status to penicillin: Secondary | ICD-10-CM

## 2022-10-25 DIAGNOSIS — E785 Hyperlipidemia, unspecified: Secondary | ICD-10-CM | POA: Diagnosis present

## 2022-10-25 DIAGNOSIS — C3412 Malignant neoplasm of upper lobe, left bronchus or lung: Secondary | ICD-10-CM | POA: Diagnosis not present

## 2022-10-25 DIAGNOSIS — K7400 Hepatic fibrosis, unspecified: Secondary | ICD-10-CM | POA: Diagnosis present

## 2022-10-25 DIAGNOSIS — E1165 Type 2 diabetes mellitus with hyperglycemia: Secondary | ICD-10-CM | POA: Diagnosis present

## 2022-10-25 DIAGNOSIS — Z1152 Encounter for screening for COVID-19: Secondary | ICD-10-CM

## 2022-10-25 DIAGNOSIS — C3432 Malignant neoplasm of lower lobe, left bronchus or lung: Secondary | ICD-10-CM | POA: Diagnosis not present

## 2022-10-25 DIAGNOSIS — I129 Hypertensive chronic kidney disease with stage 1 through stage 4 chronic kidney disease, or unspecified chronic kidney disease: Secondary | ICD-10-CM | POA: Diagnosis present

## 2022-10-25 DIAGNOSIS — E1140 Type 2 diabetes mellitus with diabetic neuropathy, unspecified: Secondary | ICD-10-CM | POA: Diagnosis present

## 2022-10-25 DIAGNOSIS — I10 Essential (primary) hypertension: Secondary | ICD-10-CM

## 2022-10-25 DIAGNOSIS — I4891 Unspecified atrial fibrillation: Secondary | ICD-10-CM | POA: Diagnosis present

## 2022-10-25 DIAGNOSIS — R918 Other nonspecific abnormal finding of lung field: Secondary | ICD-10-CM | POA: Diagnosis present

## 2022-10-25 DIAGNOSIS — J449 Chronic obstructive pulmonary disease, unspecified: Secondary | ICD-10-CM

## 2022-10-25 DIAGNOSIS — E1122 Type 2 diabetes mellitus with diabetic chronic kidney disease: Secondary | ICD-10-CM | POA: Diagnosis present

## 2022-10-25 DIAGNOSIS — I700 Atherosclerosis of aorta: Secondary | ICD-10-CM | POA: Diagnosis present

## 2022-10-25 DIAGNOSIS — E11649 Type 2 diabetes mellitus with hypoglycemia without coma: Secondary | ICD-10-CM | POA: Diagnosis not present

## 2022-10-25 DIAGNOSIS — Z8249 Family history of ischemic heart disease and other diseases of the circulatory system: Secondary | ICD-10-CM

## 2022-10-25 DIAGNOSIS — Z833 Family history of diabetes mellitus: Secondary | ICD-10-CM

## 2022-10-25 DIAGNOSIS — R042 Hemoptysis: Secondary | ICD-10-CM | POA: Diagnosis not present

## 2022-10-25 DIAGNOSIS — Z794 Long term (current) use of insulin: Secondary | ICD-10-CM

## 2022-10-25 DIAGNOSIS — J439 Emphysema, unspecified: Secondary | ICD-10-CM | POA: Diagnosis present

## 2022-10-25 DIAGNOSIS — E222 Syndrome of inappropriate secretion of antidiuretic hormone: Secondary | ICD-10-CM | POA: Diagnosis present

## 2022-10-25 DIAGNOSIS — Z803 Family history of malignant neoplasm of breast: Secondary | ICD-10-CM

## 2022-10-25 DIAGNOSIS — Z9049 Acquired absence of other specified parts of digestive tract: Secondary | ICD-10-CM

## 2022-10-25 DIAGNOSIS — D62 Acute posthemorrhagic anemia: Secondary | ICD-10-CM | POA: Diagnosis not present

## 2022-10-25 DIAGNOSIS — Z882 Allergy status to sulfonamides status: Secondary | ICD-10-CM

## 2022-10-25 DIAGNOSIS — N170 Acute kidney failure with tubular necrosis: Secondary | ICD-10-CM | POA: Diagnosis not present

## 2022-10-25 DIAGNOSIS — Z86010 Personal history of colonic polyps: Secondary | ICD-10-CM

## 2022-10-25 DIAGNOSIS — Z7985 Long-term (current) use of injectable non-insulin antidiabetic drugs: Secondary | ICD-10-CM

## 2022-10-25 DIAGNOSIS — Z7984 Long term (current) use of oral hypoglycemic drugs: Secondary | ICD-10-CM | POA: Diagnosis not present

## 2022-10-25 DIAGNOSIS — K5900 Constipation, unspecified: Secondary | ICD-10-CM | POA: Diagnosis not present

## 2022-10-25 DIAGNOSIS — Z90710 Acquired absence of both cervix and uterus: Secondary | ICD-10-CM

## 2022-10-25 DIAGNOSIS — Z791 Long term (current) use of non-steroidal anti-inflammatories (NSAID): Secondary | ICD-10-CM

## 2022-10-25 DIAGNOSIS — Z79899 Other long term (current) drug therapy: Secondary | ICD-10-CM

## 2022-10-25 DIAGNOSIS — Z7982 Long term (current) use of aspirin: Secondary | ICD-10-CM

## 2022-10-25 DIAGNOSIS — R911 Solitary pulmonary nodule: Secondary | ICD-10-CM | POA: Diagnosis not present

## 2022-10-25 DIAGNOSIS — R197 Diarrhea, unspecified: Secondary | ICD-10-CM | POA: Diagnosis not present

## 2022-10-25 DIAGNOSIS — Z87891 Personal history of nicotine dependence: Secondary | ICD-10-CM

## 2022-10-25 HISTORY — PX: INTERCOSTAL NERVE BLOCK: SHX5021

## 2022-10-25 HISTORY — PX: LYMPH NODE BIOPSY: SHX201

## 2022-10-25 HISTORY — PX: VIDEO BRONCHOSCOPY WITH ENDOBRONCHIAL NAVIGATION: SHX6175

## 2022-10-25 LAB — POCT I-STAT 7, (LYTES, BLD GAS, ICA,H+H)
Acid-base deficit: 4 mmol/L — ABNORMAL HIGH (ref 0.0–2.0)
Bicarbonate: 22.4 mmol/L (ref 20.0–28.0)
Calcium, Ion: 1.15 mmol/L (ref 1.15–1.40)
HCT: 32 % — ABNORMAL LOW (ref 36.0–46.0)
Hemoglobin: 10.9 g/dL — ABNORMAL LOW (ref 12.0–15.0)
O2 Saturation: 82 %
Patient temperature: 34
Potassium: 4.3 mmol/L (ref 3.5–5.1)
Sodium: 136 mmol/L (ref 135–145)
TCO2: 24 mmol/L (ref 22–32)
pCO2 arterial: 41.4 mmHg (ref 32–48)
pH, Arterial: 7.326 — ABNORMAL LOW (ref 7.35–7.45)
pO2, Arterial: 42 mmHg — ABNORMAL LOW (ref 83–108)

## 2022-10-25 LAB — GLUCOSE, CAPILLARY
Glucose-Capillary: 86 mg/dL (ref 70–99)
Glucose-Capillary: 107 mg/dL — ABNORMAL HIGH (ref 70–99)
Glucose-Capillary: 145 mg/dL — ABNORMAL HIGH (ref 70–99)
Glucose-Capillary: 187 mg/dL — ABNORMAL HIGH (ref 70–99)
Glucose-Capillary: 240 mg/dL — ABNORMAL HIGH (ref 70–99)
Glucose-Capillary: 168 mg/dL — ABNORMAL HIGH (ref 70–99)

## 2022-10-25 LAB — ABO/RH: ABO/RH(D): O NEG

## 2022-10-25 SURGERY — WEDGE RESECTION, LUNG, ROBOT-ASSISTED, THORACOSCOPIC
Anesthesia: General | Site: Chest | Laterality: Right

## 2022-10-25 MED ORDER — PRAVASTATIN SODIUM 40 MG PO TABS
20.0000 mg | ORAL_TABLET | Freq: Every day | ORAL | Status: DC
  Administered 2022-10-26 – 2022-10-31 (×6): 20 mg via ORAL
  Filled 2022-10-25 (×6): qty 1

## 2022-10-25 MED ORDER — LORATADINE 10 MG PO TABS: 10.0000 mg | ORAL_TABLET | Freq: Every day | ORAL | Status: DC

## 2022-10-25 MED ORDER — PHENYLEPHRINE 80 MCG/ML (10ML) SYRINGE FOR IV PUSH (FOR BLOOD PRESSURE SUPPORT)
PREFILLED_SYRINGE | INTRAVENOUS | Status: DC | PRN
  Administered 2022-10-25: 80 ug via INTRAVENOUS
  Administered 2022-10-25: 160 ug via INTRAVENOUS
  Administered 2022-10-25: 80 ug via INTRAVENOUS

## 2022-10-25 MED ORDER — HYDROCHLOROTHIAZIDE 25 MG PO TABS
25.0000 mg | ORAL_TABLET | Freq: Every day | ORAL | Status: DC
  Administered 2022-10-26 – 2022-10-28 (×3): 25 mg via ORAL
  Filled 2022-10-25 (×3): qty 1

## 2022-10-25 MED ORDER — CHLORHEXIDINE GLUCONATE 0.12 % MT SOLN
15.0000 mL | Freq: Once | OROMUCOSAL | Status: AC
  Administered 2022-10-25: 15 mL via OROMUCOSAL
  Filled 2022-10-25: qty 15

## 2022-10-25 MED ORDER — VANCOMYCIN HCL IN DEXTROSE 1-5 GM/200ML-% IV SOLN
1000.0000 mg | Freq: Two times a day (BID) | INTRAVENOUS | Status: AC
  Administered 2022-10-25: 1000 mg via INTRAVENOUS
  Filled 2022-10-25: qty 200

## 2022-10-25 MED ORDER — FENTANYL CITRATE (PF) 100 MCG/2ML IJ SOLN
INTRAMUSCULAR | Status: AC
  Filled 2022-10-25: qty 2

## 2022-10-25 MED ORDER — SODIUM CHLORIDE 0.9 % IV SOLN
INTRAVENOUS | Status: AC | PRN
  Administered 2022-10-25: 1000 mL

## 2022-10-25 MED ORDER — FENTANYL CITRATE PF 50 MCG/ML IJ SOSY
25.0000 ug | PREFILLED_SYRINGE | INTRAMUSCULAR | Status: DC | PRN
  Administered 2022-10-25 – 2022-10-26 (×2): 25 ug via INTRAVENOUS
  Administered 2022-10-27 (×3): 50 ug via INTRAVENOUS
  Filled 2022-10-25 (×5): qty 1

## 2022-10-25 MED ORDER — ROCURONIUM BROMIDE 10 MG/ML (PF) SYRINGE
PREFILLED_SYRINGE | INTRAVENOUS | Status: AC
  Filled 2022-10-25: qty 10

## 2022-10-25 MED ORDER — FENTANYL CITRATE (PF) 250 MCG/5ML IJ SOLN
INTRAMUSCULAR | Status: DC | PRN
  Administered 2022-10-25 (×2): 50 ug via INTRAVENOUS
  Administered 2022-10-25: 100 ug via INTRAVENOUS

## 2022-10-25 MED ORDER — SODIUM CHLORIDE 0.9 % IV SOLN: INTRAVENOUS | Status: DC

## 2022-10-25 MED ORDER — DAPAGLIFLOZIN PROPANEDIOL 10 MG PO TABS
10.0000 mg | ORAL_TABLET | Freq: Every day | ORAL | Status: DC
  Administered 2022-10-26 – 2022-10-28 (×3): 10 mg via ORAL
  Filled 2022-10-25 (×3): qty 1

## 2022-10-25 MED ORDER — DEXAMETHASONE SODIUM PHOSPHATE 10 MG/ML IJ SOLN
INTRAMUSCULAR | Status: DC | PRN
  Administered 2022-10-25: 5 mg via INTRAVENOUS

## 2022-10-25 MED ORDER — LACTATED RINGERS IV SOLN: INTRAVENOUS | Status: DC

## 2022-10-25 MED ORDER — SODIUM CHLORIDE (PF) 0.9 % IJ SOLN
INTRAMUSCULAR | Status: AC
  Filled 2022-10-25: qty 20

## 2022-10-25 MED ORDER — ALBUTEROL SULFATE (2.5 MG/3ML) 0.083% IN NEBU
2.5000 mg | INHALATION_SOLUTION | Freq: Four times a day (QID) | RESPIRATORY_TRACT | Status: DC
  Administered 2022-10-25 – 2022-10-26 (×2): 2.5 mg via RESPIRATORY_TRACT
  Filled 2022-10-25 (×2): qty 3

## 2022-10-25 MED ORDER — PANTOPRAZOLE SODIUM 40 MG PO TBEC
40.0000 mg | DELAYED_RELEASE_TABLET | Freq: Every day | ORAL | Status: DC
Start: 2022-10-26 — End: 2022-10-25

## 2022-10-25 MED ORDER — GLYCOPYRROLATE PF 0.2 MG/ML IJ SOSY
PREFILLED_SYRINGE | INTRAMUSCULAR | Status: DC | PRN
  Administered 2022-10-25: .2 mg via INTRAVENOUS

## 2022-10-25 MED ORDER — ONDANSETRON HCL 4 MG/2ML IJ SOLN
INTRAMUSCULAR | Status: DC | PRN
  Administered 2022-10-25: 4 mg via INTRAVENOUS

## 2022-10-25 MED ORDER — ACETAMINOPHEN 500 MG PO TABS
1000.0000 mg | ORAL_TABLET | Freq: Once | ORAL | Status: AC
  Administered 2022-10-25: 1000 mg via ORAL
  Filled 2022-10-25: qty 2

## 2022-10-25 MED ORDER — ASPIRIN 81 MG PO TBEC
81.0000 mg | DELAYED_RELEASE_TABLET | Freq: Every day | ORAL | Status: DC
  Administered 2022-10-26 – 2022-11-01 (×7): 81 mg via ORAL
  Filled 2022-10-25 (×7): qty 1

## 2022-10-25 MED ORDER — FENTANYL CITRATE (PF) 100 MCG/2ML IJ SOLN
25.0000 ug | INTRAMUSCULAR | Status: DC | PRN
  Administered 2022-10-25 (×2): 50 ug via INTRAVENOUS

## 2022-10-25 MED ORDER — SENNOSIDES-DOCUSATE SODIUM 8.6-50 MG PO TABS
1.0000 | ORAL_TABLET | Freq: Every day | ORAL | Status: DC
  Administered 2022-10-25 – 2022-10-29 (×5): 1 via ORAL
  Filled 2022-10-25 (×6): qty 1

## 2022-10-25 MED ORDER — ISOSORBIDE MONONITRATE ER 30 MG PO TB24
30.0000 mg | ORAL_TABLET | Freq: Every day | ORAL | Status: DC
  Administered 2022-10-26 – 2022-11-01 (×7): 30 mg via ORAL
  Filled 2022-10-25 (×7): qty 1

## 2022-10-25 MED ORDER — SIMETHICONE 80 MG PO CHEW
80.0000 mg | CHEWABLE_TABLET | Freq: Four times a day (QID) | ORAL | Status: DC | PRN
  Administered 2022-10-25 – 2022-10-30 (×4): 80 mg via ORAL
  Filled 2022-10-25 (×8): qty 1

## 2022-10-25 MED ORDER — PANTOPRAZOLE SODIUM 40 MG PO TBEC
40.0000 mg | DELAYED_RELEASE_TABLET | Freq: Two times a day (BID) | ORAL | Status: DC
  Administered 2022-10-25 – 2022-11-01 (×14): 40 mg via ORAL
  Filled 2022-10-25 (×14): qty 1

## 2022-10-25 MED ORDER — BISACODYL 5 MG PO TBEC
10.0000 mg | DELAYED_RELEASE_TABLET | Freq: Every day | ORAL | Status: DC
  Administered 2022-10-26 – 2022-10-29 (×4): 10 mg via ORAL
  Filled 2022-10-25 (×6): qty 2

## 2022-10-25 MED ORDER — LISINOPRIL-HYDROCHLOROTHIAZIDE 20-25 MG PO TABS: 1.0000 | ORAL_TABLET | Freq: Every day | ORAL | Status: DC

## 2022-10-25 MED ORDER — HYDROXYZINE HCL 25 MG PO TABS
25.0000 mg | ORAL_TABLET | Freq: Every day | ORAL | Status: DC
  Administered 2022-10-26 – 2022-11-01 (×7): 25 mg via ORAL
  Filled 2022-10-25 (×7): qty 1

## 2022-10-25 MED ORDER — ONDANSETRON HCL 4 MG/2ML IJ SOLN
INTRAMUSCULAR | Status: AC
  Filled 2022-10-25: qty 2

## 2022-10-25 MED ORDER — SODIUM CHLORIDE (PF) 0.9 % IJ SOLN
INTRAMUSCULAR | Status: AC
  Filled 2022-10-25: qty 10

## 2022-10-25 MED ORDER — LISINOPRIL 20 MG PO TABS
20.0000 mg | ORAL_TABLET | Freq: Every day | ORAL | Status: DC
  Administered 2022-10-26 – 2022-10-28 (×3): 20 mg via ORAL
  Filled 2022-10-25 (×3): qty 1

## 2022-10-25 MED ORDER — 0.9 % SODIUM CHLORIDE (POUR BTL) OPTIME
TOPICAL | Status: DC | PRN
  Administered 2022-10-25: 1000 mL

## 2022-10-25 MED ORDER — INSULIN ASPART 100 UNIT/ML IJ SOLN
0.0000 [IU] | INTRAMUSCULAR | Status: DC
  Administered 2022-10-25 (×2): 4 [IU] via SUBCUTANEOUS
  Administered 2022-10-25: 8 [IU] via SUBCUTANEOUS
  Administered 2022-10-26: 2 [IU] via SUBCUTANEOUS

## 2022-10-25 MED ORDER — PROPOFOL 10 MG/ML IV BOLUS
INTRAVENOUS | Status: AC
  Filled 2022-10-25: qty 20

## 2022-10-25 MED ORDER — FENTANYL CITRATE (PF) 250 MCG/5ML IJ SOLN
INTRAMUSCULAR | Status: AC
  Filled 2022-10-25: qty 5

## 2022-10-25 MED ORDER — BUPIVACAINE LIPOSOME 1.3 % IJ SUSP
INTRAMUSCULAR | Status: AC
  Filled 2022-10-25: qty 20

## 2022-10-25 MED ORDER — OXYCODONE HCL 5 MG PO TABS
5.0000 mg | ORAL_TABLET | ORAL | Status: DC | PRN
  Administered 2022-10-25: 5 mg via ORAL
  Administered 2022-10-26 – 2022-10-27 (×5): 10 mg via ORAL
  Filled 2022-10-25: qty 2
  Filled 2022-10-25: qty 1
  Filled 2022-10-25 (×4): qty 2

## 2022-10-25 MED ORDER — PROPOFOL 10 MG/ML IV BOLUS
INTRAVENOUS | Status: DC | PRN
  Administered 2022-10-25: 150 mg via INTRAVENOUS

## 2022-10-25 MED ORDER — ORAL CARE MOUTH RINSE: 15.0000 mL | Freq: Once | OROMUCOSAL | Status: AC

## 2022-10-25 MED ORDER — EPHEDRINE SULFATE-NACL 50-0.9 MG/10ML-% IV SOSY
PREFILLED_SYRINGE | INTRAVENOUS | Status: DC | PRN
  Administered 2022-10-25: 5 mg via INTRAVENOUS

## 2022-10-25 MED ORDER — LIDOCAINE 2% (20 MG/ML) 5 ML SYRINGE
INTRAMUSCULAR | Status: AC
  Filled 2022-10-25: qty 5

## 2022-10-25 MED ORDER — FENOFIBRATE 160 MG PO TABS
160.0000 mg | ORAL_TABLET | Freq: Every day | ORAL | Status: DC
  Administered 2022-10-27 – 2022-11-01 (×6): 160 mg via ORAL
  Filled 2022-10-25 (×7): qty 1

## 2022-10-25 MED ORDER — LACTATED RINGERS IV SOLN: INTRAVENOUS | Status: DC | PRN

## 2022-10-25 MED ORDER — INSULIN ASPART 100 UNIT/ML IJ SOLN: 0.0000 [IU] | INTRAMUSCULAR | Status: DC | PRN

## 2022-10-25 MED ORDER — INDOCYANINE GREEN 25 MG IV SOLR
INTRAVENOUS | Status: AC
  Filled 2022-10-25: qty 10

## 2022-10-25 MED ORDER — ROCURONIUM BROMIDE 10 MG/ML (PF) SYRINGE
PREFILLED_SYRINGE | INTRAVENOUS | Status: DC | PRN
  Administered 2022-10-25: 20 mg via INTRAVENOUS
  Administered 2022-10-25: 50 mg via INTRAVENOUS
  Administered 2022-10-25: 20 mg via INTRAVENOUS

## 2022-10-25 MED ORDER — EPINEPHRINE PF 1 MG/ML IJ SOLN
INTRAMUSCULAR | Status: AC
  Filled 2022-10-25: qty 1

## 2022-10-25 MED ORDER — PHENYLEPHRINE HCL-NACL 20-0.9 MG/250ML-% IV SOLN
INTRAVENOUS | Status: DC | PRN
  Administered 2022-10-25: 30 ug/min via INTRAVENOUS

## 2022-10-25 MED ORDER — EPHEDRINE 5 MG/ML INJ
INTRAVENOUS | Status: AC
  Filled 2022-10-25: qty 5

## 2022-10-25 MED ORDER — METHYLENE BLUE 1 % INJ SOLN
INTRAVENOUS | Status: AC
  Filled 2022-10-25: qty 10

## 2022-10-25 MED ORDER — ONDANSETRON HCL 4 MG/2ML IJ SOLN
4.0000 mg | Freq: Four times a day (QID) | INTRAMUSCULAR | Status: DC | PRN
  Administered 2022-10-27 – 2022-10-30 (×3): 4 mg via INTRAVENOUS
  Filled 2022-10-25 (×3): qty 2

## 2022-10-25 MED ORDER — BUPIVACAINE HCL (PF) 0.5 % IJ SOLN
INTRAMUSCULAR | Status: AC
  Filled 2022-10-25: qty 30

## 2022-10-25 MED ORDER — ENOXAPARIN SODIUM 40 MG/0.4ML IJ SOSY
40.0000 mg | PREFILLED_SYRINGE | INTRAMUSCULAR | Status: DC
  Administered 2022-10-25: 40 mg via SUBCUTANEOUS
  Filled 2022-10-25: qty 0.4

## 2022-10-25 MED ORDER — TRAMADOL HCL 50 MG PO TABS
50.0000 mg | ORAL_TABLET | Freq: Four times a day (QID) | ORAL | Status: DC | PRN
  Administered 2022-10-31: 50 mg via ORAL
  Administered 2022-10-31 – 2022-11-01 (×2): 100 mg via ORAL
  Filled 2022-10-25: qty 2
  Filled 2022-10-25: qty 1
  Filled 2022-10-25: qty 2

## 2022-10-25 MED ORDER — METHYLENE BLUE 1 % INJ SOLN
INTRAVENOUS | Status: DC | PRN
  Administered 2022-10-25: 1.5 mL

## 2022-10-25 MED ORDER — ACETAMINOPHEN 160 MG/5ML PO SOLN
1000.0000 mg | Freq: Four times a day (QID) | ORAL | Status: AC
  Administered 2022-10-26: 1000 mg via ORAL

## 2022-10-25 MED ORDER — DILTIAZEM HCL ER COATED BEADS 120 MG PO CP24
120.0000 mg | ORAL_CAPSULE | Freq: Every day | ORAL | Status: DC
  Administered 2022-10-26 – 2022-11-01 (×7): 120 mg via ORAL
  Filled 2022-10-25 (×7): qty 1

## 2022-10-25 MED ORDER — LIDOCAINE 2% (20 MG/ML) 5 ML SYRINGE
INTRAMUSCULAR | Status: DC | PRN
  Administered 2022-10-25: 60 mg via INTRAVENOUS

## 2022-10-25 MED ORDER — SUGAMMADEX SODIUM 200 MG/2ML IV SOLN
INTRAVENOUS | Status: DC | PRN
  Administered 2022-10-25: 150 mg via INTRAVENOUS

## 2022-10-25 MED ORDER — DEXAMETHASONE SODIUM PHOSPHATE 10 MG/ML IJ SOLN
INTRAMUSCULAR | Status: AC
  Filled 2022-10-25: qty 1

## 2022-10-25 MED ORDER — ALBUTEROL SULFATE (2.5 MG/3ML) 0.083% IN NEBU: 2.5000 mg | INHALATION_SOLUTION | RESPIRATORY_TRACT | Status: DC

## 2022-10-25 MED ORDER — SODIUM CHLORIDE FLUSH 0.9 % IV SOLN
INTRAVENOUS | Status: DC | PRN
  Administered 2022-10-25: 85 mL

## 2022-10-25 MED ORDER — ACETAMINOPHEN 500 MG PO TABS
1000.0000 mg | ORAL_TABLET | Freq: Four times a day (QID) | ORAL | Status: AC
  Administered 2022-10-25 – 2022-10-30 (×13): 1000 mg via ORAL
  Filled 2022-10-25 (×18): qty 2

## 2022-10-25 MED ORDER — VANCOMYCIN HCL IN DEXTROSE 1-5 GM/200ML-% IV SOLN
1000.0000 mg | INTRAVENOUS | Status: AC
  Administered 2022-10-25: 1000 mg via INTRAVENOUS
  Filled 2022-10-25: qty 200

## 2022-10-25 SURGICAL SUPPLY — 124 items
ADAPTER BRONCHOSCOPE OLYMPUS (ADAPTER) ×3 IMPLANT
ADAPTER VALVE BIOPSY EBUS (MISCELLANEOUS) IMPLANT
ADH SKN CLS APL DERMABOND .7 (GAUZE/BANDAGES/DRESSINGS) ×3
ADPR BSCP OLMPS EDG (ADAPTER) ×3
ADPTR VALVE BIOPSY EBUS (MISCELLANEOUS)
APPLIER CLIP ROT 10 11.4 M/L (STAPLE)
APR CLP MED LRG 11.4X10 (STAPLE)
BAG SPEC RTRVL C125 8X14 (MISCELLANEOUS) ×12
BLADE CLIPPER SURG (BLADE) ×3 IMPLANT
BRUSH BIOPSY BRONCH 10 SDTNB (MISCELLANEOUS) IMPLANT
BRUSH SUPERTRAX BIOPSY (INSTRUMENTS) IMPLANT
BRUSH SUPERTRAX NDL-TIP CYTO (INSTRUMENTS) ×3 IMPLANT
CANISTER SUCT 3000ML PPV (MISCELLANEOUS) ×6 IMPLANT
CANNULA REDUCER 12-8 DVNC XI (CANNULA) ×6 IMPLANT
CLIP APPLIE ROT 10 11.4 M/L (STAPLE) IMPLANT
CNTNR URN SCR LID CUP LEK RST (MISCELLANEOUS) ×15 IMPLANT
CONN ST 1/4X3/8  BEN (MISCELLANEOUS)
CONN ST 1/4X3/8 BEN (MISCELLANEOUS) IMPLANT
CONT SPEC 4OZ STRL OR WHT (MISCELLANEOUS) ×30
COVER BACK TABLE 60X90IN (DRAPES) ×3 IMPLANT
DEFOGGER SCOPE WARMER CLEARIFY (MISCELLANEOUS) ×3 IMPLANT
DERMABOND ADVANCED .7 DNX12 (GAUZE/BANDAGES/DRESSINGS) ×3 IMPLANT
DRAIN CHANNEL 28F RND 3/8 FF (WOUND CARE) IMPLANT
DRAIN CHANNEL 32F RND 10.7 FF (WOUND CARE) IMPLANT
DRAPE ARM DVNC X/XI (DISPOSABLE) ×12 IMPLANT
DRAPE COLUMN DVNC XI (DISPOSABLE) ×3 IMPLANT
DRAPE CV SPLIT W-CLR ANES SCRN (DRAPES) ×3 IMPLANT
DRAPE HALF SHEET 40X57 (DRAPES) ×3 IMPLANT
DRAPE INCISE IOBAN 66X45 STRL (DRAPES) IMPLANT
DRAPE ORTHO SPLIT 77X108 STRL (DRAPES) ×3
DRAPE SURG ORHT 6 SPLT 77X108 (DRAPES) ×3 IMPLANT
ELECT BLADE 6.5 EXT (BLADE) IMPLANT
ELECT REM PT RETURN 9FT ADLT (ELECTROSURGICAL) ×3
ELECTRODE REM PT RTRN 9FT ADLT (ELECTROSURGICAL) ×3 IMPLANT
FILTER STRAW FLUID ASPIR (MISCELLANEOUS) ×3 IMPLANT
FORCEPS BIOP SUPERTRX PREMAR (INSTRUMENTS) IMPLANT
FORCEPS BPLR FENES DVNC XI (FORCEP) IMPLANT
FORCEPS BPLR LNG DVNC XI (INSTRUMENTS) IMPLANT
GAUZE 4X4 16PLY ~~LOC~~+RFID DBL (SPONGE) IMPLANT
GAUZE KITTNER 4X5 RF (MISCELLANEOUS) ×6 IMPLANT
GAUZE SPONGE 4X4 12PLY STRL (GAUZE/BANDAGES/DRESSINGS) ×3 IMPLANT
GLOVE SS BIOGEL STRL SZ 7.5 (GLOVE) ×3 IMPLANT
GLOVE SURG SIGNA 7.5 PF LTX (GLOVE) ×3 IMPLANT
GOWN STRL REUS W/ TWL LRG LVL3 (GOWN DISPOSABLE) ×6 IMPLANT
GOWN STRL REUS W/ TWL XL LVL3 (GOWN DISPOSABLE) ×6 IMPLANT
GOWN STRL REUS W/TWL 2XL LVL3 (GOWN DISPOSABLE) ×3 IMPLANT
GOWN STRL REUS W/TWL LRG LVL3 (GOWN DISPOSABLE) ×6
GOWN STRL REUS W/TWL XL LVL3 (GOWN DISPOSABLE) ×6
GRASPER TIP-UP FEN DVNC XI (INSTRUMENTS) IMPLANT
HEMOSTAT SURGICEL 2X14 (HEMOSTASIS) ×9 IMPLANT
IRRIGATION STRYKERFLOW (MISCELLANEOUS) ×3 IMPLANT
IRRIGATOR STRYKERFLOW (MISCELLANEOUS) ×3
KIT BASIN OR (CUSTOM PROCEDURE TRAY) ×3 IMPLANT
KIT CLEAN ENDO COMPLIANCE (KITS) ×3 IMPLANT
KIT ILLUMISITE 180 PROCEDURE (KITS) IMPLANT
KIT ILLUMISITE 90 PROCEDURE (KITS) IMPLANT
KIT SUCTION CATH 14FR (SUCTIONS) IMPLANT
KIT TURNOVER KIT B (KITS) ×3 IMPLANT
MARKER SKIN DUAL TIP RULER LAB (MISCELLANEOUS) ×3 IMPLANT
NDL HYPO 25GX1X1/2 BEV (NEEDLE) ×3 IMPLANT
NDL SPNL 22GX3.5 QUINCKE BK (NEEDLE) ×3 IMPLANT
NDL SUPERTRX PREMARK BIOPSY (NEEDLE) IMPLANT
NEEDLE HYPO 25GX1X1/2 BEV (NEEDLE) ×3 IMPLANT
NEEDLE SPNL 22GX3.5 QUINCKE BK (NEEDLE) ×3 IMPLANT
NEEDLE SUPERTRX PREMARK BIOPSY (NEEDLE) IMPLANT
NS IRRIG 1000ML POUR BTL (IV SOLUTION) ×3 IMPLANT
OIL SILICONE PENTAX (PARTS (SERVICE/REPAIRS)) ×3 IMPLANT
PACK CHEST (CUSTOM PROCEDURE TRAY) ×3 IMPLANT
PAD ARMBOARD 7.5X6 YLW CONV (MISCELLANEOUS) ×6 IMPLANT
PATCHES PATIENT (LABEL) ×9 IMPLANT
PORT ACCESS TROCAR AIRSEAL 12 (TROCAR) ×3 IMPLANT
RELOAD STAPLE 45 3.5 BLU DVNC (STAPLE) IMPLANT
RELOAD STAPLE 45 4.3 GRN DVNC (STAPLE) IMPLANT
RELOAD STAPLE 45 4.6 BLK DVNC (STAPLE) IMPLANT
RELOAD STAPLER 3.5X45 BLU DVNC (STAPLE) ×9 IMPLANT
RELOAD STAPLER 4.3X45 GRN DVNC (STAPLE) ×27 IMPLANT
RELOAD STAPLER 45 4.6 BLK DVNC (STAPLE) ×27 IMPLANT
SCISSORS LAP 5X35 DISP (ENDOMECHANICALS) IMPLANT
SEAL UNIV 5-12 XI (MISCELLANEOUS) ×12 IMPLANT
SET TRI-LUMEN FLTR TB AIRSEAL (TUBING) ×3 IMPLANT
SOL ELECTROSURG ANTI STICK (MISCELLANEOUS) ×3
SOLUTION ELECTROSURG ANTI STCK (MISCELLANEOUS) ×3 IMPLANT
SPONGE T-LAP 18X18 ~~LOC~~+RFID (SPONGE) IMPLANT
SPONGE T-LAP 4X18 ~~LOC~~+RFID (SPONGE) IMPLANT
SPONGE TONSIL 1 RF SGL (DISPOSABLE) IMPLANT
STAPLER 45 SUREFORM DVNC (STAPLE) IMPLANT
STAPLER RELOAD 3.5X45 BLU DVNC (STAPLE) ×9
STAPLER RELOAD 4.3X45 GRN DVNC (STAPLE) ×27
STAPLER RELOAD 45 4.6 BLK DVNC (STAPLE) ×27
SUT PDS AB 3-0 SH 27 (SUTURE) IMPLANT
SUT PROLENE 4 0 RB 1 (SUTURE)
SUT PROLENE 4-0 RB1 .5 CRCL 36 (SUTURE) IMPLANT
SUT SILK  1 MH (SUTURE) ×3
SUT SILK 1 MH (SUTURE) ×6 IMPLANT
SUT SILK 2 0 SH (SUTURE) IMPLANT
SUT SILK 2 0SH CR/8 30 (SUTURE) IMPLANT
SUT SILK 3 0SH CR/8 30 (SUTURE) IMPLANT
SUT VIC AB 1 CTX 36 (SUTURE) ×3
SUT VIC AB 1 CTX36XBRD ANBCTR (SUTURE) IMPLANT
SUT VIC AB 2-0 CTX 36 (SUTURE) IMPLANT
SUT VIC AB 3-0 X1 27 (SUTURE) ×6 IMPLANT
SUT VICRYL 0 TIES 12 18 (SUTURE) ×3 IMPLANT
SUT VICRYL 0 UR6 27IN ABS (SUTURE) ×6 IMPLANT
SYR 20ML ECCENTRIC (SYRINGE) ×3 IMPLANT
SYR 20ML LL LF (SYRINGE) ×6 IMPLANT
SYR 30ML LL (SYRINGE) IMPLANT
SYR 50ML LL SCALE MARK (SYRINGE) ×3 IMPLANT
SYR 5ML LL (SYRINGE) ×3 IMPLANT
SYR TB 1ML LUER SLIP (SYRINGE) IMPLANT
SYSTEM RETRIEVAL ANCHOR 8 (MISCELLANEOUS) IMPLANT
SYSTEM SAHARA CHEST DRAIN ATS (WOUND CARE) ×3 IMPLANT
TAPE CLOTH 4X10 WHT NS (GAUZE/BANDAGES/DRESSINGS) ×3 IMPLANT
TIP APPLICATOR SPRAY EXTEND 16 (VASCULAR PRODUCTS) IMPLANT
TOWEL GREEN STERILE (TOWEL DISPOSABLE) ×6 IMPLANT
TOWEL GREEN STERILE FF (TOWEL DISPOSABLE) ×3 IMPLANT
TRAP SPECIMEN MUCUS 40CC (MISCELLANEOUS) ×3 IMPLANT
TRAY FOLEY MTR SLVR 14FR STAT (SET/KITS/TRAYS/PACK) IMPLANT
TRAY FOLEY MTR SLVR 16FR STAT (SET/KITS/TRAYS/PACK) ×3 IMPLANT
TUBE CONNECTING 20X1/4 (TUBING) ×6 IMPLANT
UNDERPAD 30X36 HEAVY ABSORB (UNDERPADS AND DIAPERS) ×3 IMPLANT
VALVE BIOPSY  SINGLE USE (MISCELLANEOUS) ×3
VALVE BIOPSY SINGLE USE (MISCELLANEOUS) ×3 IMPLANT
VALVE SUCTION BRONCHIO DISP (MISCELLANEOUS) ×3 IMPLANT
WATER STERILE IRR 1000ML POUR (IV SOLUTION) ×3 IMPLANT

## 2022-10-25 NOTE — Anesthesia Postprocedure Evaluation (Signed)
Anesthesia Post Note  Patient: Audrey Green  Procedure(s) Performed: XI ROBOTIC ASSISTED THORACOSCOPY-RIGHT UPPER AND LOWER LOBE WEDGE RESECTIONS (Right: Chest) VIDEO BRONCHOSCOPY WITH ENDOBRONCHIAL NAVIGATION LYMPH NODE BIOPSY INTERCOSTAL NERVE BLOCK     Patient location during evaluation: PACU Anesthesia Type: General Level of consciousness: awake and alert Pain management: pain level controlled Vital Signs Assessment: post-procedure vital signs reviewed and stable Respiratory status: spontaneous breathing, nonlabored ventilation, respiratory function stable and patient connected to nasal cannula oxygen Cardiovascular status: blood pressure returned to baseline and stable Postop Assessment: no apparent nausea or vomiting Anesthetic complications: yes  Encounter Notable Events  Notable Event Outcome Phase Comment  Difficult to intubate - expected  Intraprocedure Filed from anesthesia note documentation.    Last Vitals:  Vitals:   10/25/22 1345 10/25/22 1400  BP: (!) 97/50 (!) 99/52  Pulse: (!) 47 (!) 51  Resp: 12 11  Temp:  (!) 36.4 C  SpO2: 97% 97%    Last Pain:  Vitals:   10/25/22 1345  TempSrc:   PainSc: 2                  Khala Tarte,W. EDMOND

## 2022-10-25 NOTE — Progress Notes (Signed)
Complained of gas pain and started belching.  Ambulated along the hallway with walker and assistance with no problem.Requested for ginger ale , claimed it help a lot . Continue to monitor.

## 2022-10-25 NOTE — Interval H&P Note (Signed)
History and Physical Interval Note:  10/25/2022 8:11 AM  Audrey Green  has presented today for surgery, with the diagnosis of RIGHT UPPER AND LOWER LOBE NODULES.  The various methods of treatment have been discussed with the patient and family. After consideration of risks, benefits and other options for treatment, the patient has consented to  Procedure(s): XI ROBOTIC ASSISTED THORACOSCOPY-RIGHT UPPER AND LOWER LOBE WEDGE RESECTIONS (Right) VIDEO BRONCHOSCOPY WITH ENDOBRONCHIAL NAVIGATION (N/A) LYMPH NODE BIOPSY (N/A) INTERCOSTAL NERVE BLOCK as a surgical intervention.  The patient's history has been reviewed, patient examined, no change in status, stable for surgery.  I have reviewed the patient's chart and labs.  Questions were answered to the patient's satisfaction.     Loreli Slot

## 2022-10-25 NOTE — Brief Op Note (Addendum)
10/25/2022  6:26 PM  PATIENT:  Audrey Green  77 y.o. female  PRE-OPERATIVE DIAGNOSIS:  RIGHT UPPER AND LOWER LOBE NODULES  POST-OPERATIVE DIAGNOSIS:  RIGHT UPPER AND LOWER LOBE NODULES  PROCEDURE:   NAVIGATIONAL BRONCHOSCOPY for tumor marking ROBOTIC ASSISTED RIGHT VATS WEDGE RESECTION RIGHT UPPER AND LOWER LOBE LYMPH NODE BIOPSY INTERCOSTAL NERVE BLOCKS LEVELS 3-10  SURGEON:  Surgeon(s) and Role:    * Loreli Slot, MD - Primary  PHYSICIAN ASSISTANT: Gershon Crane, PA-C   ASSISTANTS: none   ANESTHESIA:   general  EBL:  75 mL   BLOOD ADMINISTERED:none  DRAINS: 1 (28 [place] ) Blake drain(s) in the right hemithorax    LOCAL MEDICATIONS USED:  BUPIVICAINE  and OTHER Exparel  SPECIMEN:  Source of Specimen:  Right upper and lower lobe wedge resections  DISPOSITION OF SPECIMEN:  PATHOLOGY  COUNTS:  YES  TOURNIQUET:  * No tourniquets in log *  DICTATION: .Other Dictation: Dictation Number pending  PLAN OF CARE: Admit to inpatient   PATIENT DISPOSITION: Stable to PACU  Delay start of Pharmacological VTE agent (>24hrs) due to surgical blood loss or risk of bleeding: no  Complications: No known

## 2022-10-25 NOTE — Anesthesia Procedure Notes (Signed)
Procedure Name: Intubation Date/Time: 10/25/2022 8:30 AM  Performed by: De Nurse, CRNAPre-anesthesia Checklist: Patient identified, Emergency Drugs available, Suction available and Patient being monitored Patient Re-evaluated:Patient Re-evaluated prior to induction Oxygen Delivery Method: Circle System Utilized Preoxygenation: Pre-oxygenation with 100% oxygen Induction Type: IV induction Ventilation: Mask ventilation without difficulty Laryngoscope Size: 3 and Glidescope Grade View: Grade I Tube type: Oral Tube size: 8.5 mm Number of attempts: 1 Airway Equipment and Method: Stylet and Oral airway Placement Confirmation: ETT inserted through vocal cords under direct vision, positive ETCO2 and breath sounds checked- equal and bilateral Secured at: 22 cm Tube secured with: Tape Dental Injury: Teeth and Oropharynx as per pre-operative assessment  Difficulty Due To: Difficulty was anticipated and Difficult Airway- due to anterior larynx Future Recommendations: Recommend- induction with short-acting agent, and alternative techniques readily available Comments: Placed by Terri Piedra

## 2022-10-25 NOTE — Anesthesia Procedure Notes (Signed)
Date/Time: 10/25/2022 9:14 AM  Performed by: De Nurse, CRNALaryngoscope Size: Glidescope and 3 Endobronchial tube: Left, Double lumen EBT and EBT position confirmed by fiberoptic bronchoscope and 37 Fr Number of attempts: 2 Airway Equipment and Method: Stylet, Video-laryngoscopy and Fiberoptic brochoscope Placement Confirmation: ETT inserted through vocal cords under direct vision Secured at: 29 cm Tube secured with: Tape Dental Injury: Teeth and Oropharynx as per pre-operative assessment  Comments: Placed by Dr Aleene Davidson, MDA

## 2022-10-25 NOTE — Anesthesia Procedure Notes (Signed)
Arterial Line Insertion Start/End5/16/2024 7:00 AM, 10/25/2022 7:05 AM Performed by: De Nurse, CRNA  Patient location: Pre-op. Lidocaine 1% used for infiltration Left, radial was placed Catheter size: 20 G Hand hygiene performed  and maximum sterile barriers used   Attempts: 2 Procedure performed without using ultrasound guided technique. Following insertion, dressing applied and Biopatch. Post procedure assessment: normal and unchanged  Patient tolerated the procedure well with no immediate complications. Additional procedure comments: Performed by Beverley Fiedler with supervision.

## 2022-10-25 NOTE — Hospital Course (Addendum)
  HPI: At time of CT surgical office visit   Audrey Green returns for follow-up of multiple lung nodules.   Audrey Green is a 77 year old woman with a past history significant for bilateral lung nodules, remote tobacco use (quit 15 years ago), hypertension, hyperlipidemia, aortic atherosclerosis, heart murmur, insulin-dependent type 2 diabetes, stage IV chronic kidney disease, hepatitis C, hepatic fibrosis, and osteoporosis.   She has been followed for many years for bilateral groundglass opacities.  Initially by Dr. Edwyna Shell and then by Dr. Tyrone Sage.  She last saw Dr. Tyrone Sage in 2020.   She has been feeling well.  Denies any chest pain, pressure, or tightness either at rest or with exertion.  He is short of breath with heavy exertion but not with normal activities.  No cough or wheezing.  No change in appetite or weight loss.  Dr. Dorris Fetch evaluated the patient and most recent relevant studies and recommended proceeding with surgical intervention.  Hospital course patient was admitted on 10/25/2022 and taken the operating room following navigational marking and underwent a right robotic assisted thoracoscopy for multiple wedge resection samples and lymph node sampling x 2.  She tolerated the procedure well was taken the PACU in stable condition.  Postoperative hospital course:  Patient has remained hemodynamically stable.  Chest tube was kept in place on postoperative day 1.  There was no definite air leak but movement in the tubing was noted so it was kept in place.  There is some concern that initial pathology did not show findings related to the nodules and a CT scan is going to be obtained on postop day 1 to make sure they were obtained.  This scan did not show evidence that the lesions were not obtained.  Creatinine is elevated but at preoperative baseline.  She does have a minor expected acute blood loss anemia which is being monitored clinically.  She has remained hemodynamically stable in sinus  rhythm.  Oxygen saturations did dip into the 80s with ambulation and home oxygen is being arranged.  Chest tube was removed on postop day #2 and x-ray has remained stable.  She does have some constipation is being treated with laxatives.  Incisions are healing well without evidence of infection.  She is tolerating diet.  He is tolerating gradually increasing activities using standard rehab protocols. On post-op day 4 she was noted to have developed acute renal insufficiency with serum creatinine increasing to 3.6. We requested nephrology consultation and Audrey Green was evaluated by Dr Juel Burrow.  Renal ultrasound was unremarkable with no hydronephrosis. She continued to make urine but this was difficult to quantify due to frequent bowel movements. Repeat creatinine on post-op day 5 had improved to 2.8.

## 2022-10-25 NOTE — Op Note (Signed)
NAME: Audrey Green, Audrey Green MEDICAL RECORD NO: [id number] ACCOUNT NO: [id number] DATE OF BIRTH: [birth year] FACILITY: [facility] LOCATION: [facility] PHYSICIAN: Salvatore Decent. Dorris Fetch, MD  Operative Report   DATE OF PROCEDURE: 10/25/2022  PREOPERATIVE DIAGNOSIS:  Right upper and lower lobe lung nodules.  POSTOPERATIVE DIAGNOSIS:  Right upper and lower lobe lung nodules.  PROCEDURE:   Xi robotic-assisted right VATS  Right upper and lower lobe wedge resections,  Lymph node biopsy and  Intercostal nerve blocks levels 3 through 10.  SURGEON:  Salvatore Decent. Dorris Fetch, MD  ASSISTANT:  Gershon Crane, PA  ANESTHESIA:  General.  FINDINGS:  Both sites marked by navigational bronchoscopy were easily visible and resected with a 2 cm gross margin.  No tumor seen at either site on frozen section.  Additional biopsies taken for permanent pathology.  CLINICAL NOTE:  Audrey Green is a 77 year old woman with known lung nodules, who has been followed with bilateral ground glass opacity over time.  She recently had a CT, which showed progression of a right upper lobe nodule and was advised to undergo resection of that nodule plus a more cylindrical nodule in the lower lobe.  The plan was to do navigational bronchoscopy to mark the nodules followed by robotic-assisted wedge resections.  The indications, risks, benefits, and alternatives were discussed in detail with the patient.  She understood and accepted the risks and agreed to proceed.  OPERATIVE NOTE:  Audrey Green was brought to the preoperative holding area on 10/25/2022.  Anesthesia placed an arterial blood pressure monitoring line and established, intravenous access.  Planning for the navigational bronchoscopy was performed on the console prior to induction. She was taken to the operating room and anesthetized and intubated.    Foley catheter was placed and sequential compression devices were placed on the calves for DVT prophylaxis.  A Bair Hugger was placed for active warming.  A  timeout was performed.  Flexible fiberoptic bronchoscopy was performed via the endotracheal tube, it revealed normal endobronchial anatomy with no endobronchial lesions to the level of subsegmental bronchi.  Locatable guide for navigation was placed.  Registration was performed.  The bronchoscope then was directed to the basilar segmental bronchus of the right lower lobe.  The appropriate subsegmental bronchus was cannulated and the catheter was easily directed to the area of the nodule.  0.5 mL of a solution containing 50% methylene blue and 50% ICG was injected at the nodule with fluoroscopy confirmation.  The locatable guide then was redirected to the right upper lobe and again the appropriate subsegmental bronchus was  cannulated and the catheter was advanced to within 1.5 cm of the nodule with good alignment.  Again, marking was performed with 0.5 mL of the methylene blue and ICG solution.  Again, fluoroscopy was used.  The total fluoroscopy time was 34 seconds and the total dose was 7.35 milligray.  The bronchoscope then was withdrawn.    The patient was reintubated with a double lumen endotracheal tube.  This was a difficult and required multiple attempts by Anesthesiology to achieve satisfactory placement.  She then was placed in a left lateral decubitus position.  The right chest was prepped and draped in the usual sterile fashion.  Again, a [employer] was in place for active warming.  Intravenous antibiotics had been administered at the time of induction.  A new timeout was performed.  A solution containing 20 mL of liposomal bupivacaine, 30 mL of 0.5% bupivacaine and 50 mL of saline was prepared.  This solution was used for  local at the incision sites as well as for the intercostal nerve blocks.  An incision was made in the eighth interspace in the midaxillary line, and an 8 mm robotic port was inserted.  The thoracoscope was advanced into the chest.  After confirming intrapleural placement, carbon  dioxide was insufflated per protocol.  A 12 mm robotic port was placed in the eighth interspace anterior to the camera port.  Intercostal nerve blocks then were performed from the third to the tenth interspace injecting 10 mL of the bupivacaine solution into a subpleural plane at each level.  The remaining robotic ports were inserted and a 12 mm AirSeal port was placed in the tenth interspace posterolaterally.  The robot was deployed.  The camera arm was docked, and targeting was performed.  The remaining arms were docked.  Robotic instruments were inserted with  thoracoscopic visualization.  Inspection revealed the sites in the lateral basilar segment and the posterior segment of the right upper lobe were easily visible both with methylene blue superficially as well as ICG to provide a depth.  The upper lobe wedge resection was performed with sequential firings of the robotic stapler, encompassing the entire area marked with ICG along with a roughly 2 cm of lung beyond the marked area.  The specimen was placed into an endoscopic retrieval bag and removed.  A nodule was palpable.  It was marked with a suture and the specimen was sent for frozen section.  While awaiting that result, the process was repeated with the lateral basilar lower lobe nodule.  Again, the marked area and a margin of tissue around it was removed using sequential firings of the robotic stapler.  This specimen also was sent, there did appear to be a palpable nodule there as well.  The frozen section on both areas showed only benign lung  parenchyma. Additional wedge resections were performed to each side, removing additional lung at the tumor sites to ensure the entire area had been sampled.  No discrete definitive nodule could be palpated at either site in these specimens, which were sent for permanent pathology.  There was good hemostasis at all the staple lines. It was not felt that additional sampling was warranted.  We will await results  of final pathology to determine the next step.  The robotic instruments were removed.  The robot was undocked.  The sponges that had been placed during the procedure were removed prior to undocking the robot.  The chest was copiously irrigated with saline.  A test inflation at 30 cm water revealed no air leaks.  A 28-French Blake drain was placed through the anterior port incision and secured with #1 Silk suture.  Dual lung ventilation was resumed.  The remaining incisions were closed in standard fashion.  Dermabond was applied to the skin.  The chest tube was placed to a Pleur-Evac on waterseal.  She was placed back in a supine position.  She was extubated in the operating room and taken to the postanesthetic care unit in good condition.  All sponge, needle and instrument counts were correct at the end of the procedure.  Experienced assistance was necessary for this case due to surgical complexity.  Gershon Crane served as the Geophysicist/field seismologist providing assistance with port placement, robot docking and undocking, instrument exchange, specimen retrieval and suctioning.   PUS D: 10/25/2022 6:38:21 pm T: 10/25/2022 8:44:00 pm  JOB: [id number]/ [id number]

## 2022-10-25 NOTE — Transfer of Care (Signed)
Immediate Anesthesia Transfer of Care Note  Patient: Audrey Green  Procedure(s) Performed: XI ROBOTIC ASSISTED THORACOSCOPY-RIGHT UPPER AND LOWER LOBE WEDGE RESECTIONS (Right: Chest) VIDEO BRONCHOSCOPY WITH ENDOBRONCHIAL NAVIGATION LYMPH NODE BIOPSY INTERCOSTAL NERVE BLOCK  Patient Location: PACU  Anesthesia Type:General  Level of Consciousness: drowsy  Airway & Oxygen Therapy: Patient Spontanous Breathing and Patient connected to face mask oxygen  Post-op Assessment: Report given to RN and Patient moving all extremities X 4  Post vital signs: Reviewed and stable  Last Vitals:  Vitals Value Taken Time  BP 117/70 10/25/22 1305  Temp    Pulse 52 10/25/22 1309  Resp 12 10/25/22 1309  SpO2 100 % 10/25/22 1309  Vitals shown include unvalidated device data.  Last Pain:  Vitals:   10/25/22 0620  TempSrc:   PainSc: 0-No pain      Patients Stated Pain Goal: 0 (10/25/22 1610)  Complications:  Encounter Notable Events  Notable Event Outcome Phase Comment  Difficult to intubate - expected  Intraprocedure Filed from anesthesia note documentation.

## 2022-10-26 ENCOUNTER — Inpatient Hospital Stay (HOSPITAL_COMMUNITY): Payer: Medicare HMO

## 2022-10-26 ENCOUNTER — Encounter (HOSPITAL_COMMUNITY): Payer: Self-pay | Admitting: Thoracic Surgery (Cardiothoracic Vascular Surgery)

## 2022-10-26 LAB — CBC
HCT: 32.5 % — ABNORMAL LOW (ref 36.0–46.0)
Hemoglobin: 10.5 g/dL — ABNORMAL LOW (ref 12.0–15.0)
MCH: 27.9 pg (ref 26.0–34.0)
MCHC: 32.3 g/dL (ref 30.0–36.0)
MCV: 86.2 fL (ref 80.0–100.0)
Platelets: 283 10*3/uL (ref 150–400)
RBC: 3.77 MIL/uL — ABNORMAL LOW (ref 3.87–5.11)
RDW: 12.9 % (ref 11.5–15.5)
WBC: 12.8 10*3/uL — ABNORMAL HIGH (ref 4.0–10.5)
nRBC: 0 % (ref 0.0–0.2)

## 2022-10-26 LAB — BASIC METABOLIC PANEL
Anion gap: 10 (ref 5–15)
BUN: 28 mg/dL — ABNORMAL HIGH (ref 8–23)
CO2: 20 mmol/L — ABNORMAL LOW (ref 22–32)
Calcium: 7.8 mg/dL — ABNORMAL LOW (ref 8.9–10.3)
Chloride: 102 mmol/L (ref 98–111)
Creatinine, Ser: 2 mg/dL — ABNORMAL HIGH (ref 0.44–1.00)
GFR, Estimated: 26 mL/min — ABNORMAL LOW (ref >60)
Glucose, Bld: 165 mg/dL — ABNORMAL HIGH (ref 70–99)
Potassium: 4.5 mmol/L (ref 3.5–5.1)
Sodium: 132 mmol/L — ABNORMAL LOW (ref 135–145)

## 2022-10-26 LAB — GLUCOSE, CAPILLARY
Glucose-Capillary: 134 mg/dL — ABNORMAL HIGH (ref 70–99)
Glucose-Capillary: 208 mg/dL — ABNORMAL HIGH (ref 70–99)
Glucose-Capillary: 155 mg/dL — ABNORMAL HIGH (ref 70–99)
Glucose-Capillary: 170 mg/dL — ABNORMAL HIGH (ref 70–99)
Glucose-Capillary: 217 mg/dL — ABNORMAL HIGH (ref 70–99)

## 2022-10-26 MED ORDER — INSULIN ASPART 100 UNIT/ML IJ SOLN
0.0000 [IU] | Freq: Three times a day (TID) | INTRAMUSCULAR | Status: DC
  Administered 2022-10-26 (×2): 3 [IU] via SUBCUTANEOUS
  Administered 2022-10-27: 2 [IU] via SUBCUTANEOUS
  Administered 2022-10-27: 3 [IU] via SUBCUTANEOUS
  Administered 2022-10-31: 2 [IU] via SUBCUTANEOUS

## 2022-10-26 MED ORDER — CHLORHEXIDINE GLUCONATE CLOTH 2 % EX PADS
6.0000 | MEDICATED_PAD | Freq: Every day | CUTANEOUS | Status: DC
  Administered 2022-10-26: 6 via TOPICAL

## 2022-10-26 MED ORDER — CHLORHEXIDINE GLUCONATE CLOTH 2 % EX PADS
6.0000 | MEDICATED_PAD | Freq: Once | CUTANEOUS | Status: AC
  Administered 2022-10-25: 6 via TOPICAL

## 2022-10-26 MED ORDER — INSULIN ASPART 100 UNIT/ML IJ SOLN
0.0000 [IU] | Freq: Every day | INTRAMUSCULAR | Status: DC
  Administered 2022-10-26: 2 [IU] via SUBCUTANEOUS

## 2022-10-26 MED ORDER — ENOXAPARIN SODIUM 30 MG/0.3ML IJ SOSY
30.0000 mg | PREFILLED_SYRINGE | INTRAMUSCULAR | Status: DC
  Administered 2022-10-26 – 2022-10-31 (×6): 30 mg via SUBCUTANEOUS
  Filled 2022-10-26 (×6): qty 0.3

## 2022-10-26 MED ORDER — INSULIN DETEMIR 100 UNIT/ML ~~LOC~~ SOLN
20.0000 [IU] | Freq: Two times a day (BID) | SUBCUTANEOUS | Status: DC
  Administered 2022-10-26 – 2022-10-27 (×3): 20 [IU] via SUBCUTANEOUS
  Filled 2022-10-26 (×4): qty 0.2

## 2022-10-26 MED ORDER — ALBUTEROL SULFATE (2.5 MG/3ML) 0.083% IN NEBU
2.5000 mg | INHALATION_SOLUTION | Freq: Two times a day (BID) | RESPIRATORY_TRACT | Status: DC
  Administered 2022-10-26: 2.5 mg via RESPIRATORY_TRACT
  Filled 2022-10-26: qty 3

## 2022-10-26 MED ORDER — ALBUTEROL SULFATE (2.5 MG/3ML) 0.083% IN NEBU: 2.5000 mg | INHALATION_SOLUTION | RESPIRATORY_TRACT | Status: DC | PRN

## 2022-10-26 NOTE — Plan of Care (Signed)

## 2022-10-26 NOTE — Progress Notes (Signed)
  Transition of Care [facility]) Screening Note   Patient Details  Name: Audrey Green Date of Birth: Feb 25, 1946   Transition of Care [facility]) CM/SW Contact:    Harriet Masson, RN Phone Number: 10/26/2022, 8:18 AM    Transition of Care Department [facility]) has reviewed patient and no TOC needs have been identified at this time. We will continue to monitor patient advancement through interdisciplinary progression rounds. If new patient transition needs arise, please place a TOC consult.  Post op day 1 for  XI ROBOTIC ASSISTED THORACOSCOPY-RIGHT UPPER AND LOWER LOBE WEDGE RESECTION.

## 2022-10-26 NOTE — Progress Notes (Addendum)
[address]       [place] [address]             [phone]     1 Day Post-Op Procedure(s) (LRB): XI ROBOTIC ASSISTED THORACOSCOPY-RIGHT UPPER AND LOWER LOBE WEDGE RESECTIONS (Right) VIDEO BRONCHOSCOPY WITH ENDOBRONCHIAL NAVIGATION (N/A) LYMPH NODE BIOPSY (N/A) INTERCOSTAL NERVE BLOCK Subjective: Some productive sputum Feels pretty well  Objective: Vital signs in last 24 hours: Temp:  [97 F (36.1 C)-98.1 F (36.7 C)] 97.6 F (36.4 C) (05/17 0731) Pulse Rate:  [47-77] 71 (05/17 0731) Cardiac Rhythm: Normal sinus rhythm (05/16 1942) Resp:  [11-22] 15 (05/17 0731) BP: (97-141)/(50-71) 135/50 (05/17 0731) SpO2:  [97 %-100 %] 100 % (05/17 0731) Arterial Line BP: (106-109)/(36-41) 109/36 (05/16 1330)  Hemodynamic parameters for last 24 hours:    Intake/Output from previous day: 05/16 0701 - 05/17 0700 In: 3282.9 [P.O.:300; I.V.:2982.9] Out: 1717 [Urine:1500; Blood:75; Chest Tube:142] Intake/Output this shift: Total I/O In: 240 [P.O.:240] Out: -   General appearance: alert, cooperative, and no distress Heart: regular rate and rhythm Lungs: mildly dim right base Abdomen: benign Extremities: no edema or calf tenderness Wound: healing well  Lab Results: Recent Labs    10/23/22 1157 10/25/22 1033 10/26/22 0014  WBC 6.3  --  12.8*  HGB 12.8 10.9* 10.5*  HCT 41.1 32.0* 32.5*  PLT 365  --  283   BMET:  Recent Labs    10/23/22 1157 10/25/22 1033 10/26/22 0014  NA 135 136 132*  K 4.7 4.3 4.5  CL 103  --  102  CO2 20*  --  20*  GLUCOSE 106*  --  165*  BUN 25*  --  28*  CREATININE 2.06*  --  2.00*  CALCIUM 8.9  --  7.8*    PT/INR:  Recent Labs    10/23/22 1157  LABPROT 13.4  INR 1.0   ABG    Component Value Date/Time   PHART 7.326 (L) 10/25/2022 1033   HCO3 22.4 10/25/2022 1033   TCO2 24 10/25/2022 1033   ACIDBASEDEF 4.0 (H) 10/25/2022 1033   O2SAT 82 10/25/2022 1033   CBG (last 3)  Recent Labs    10/25/22 1907  10/25/22 2323 10/26/22 0353  GLUCAP 240* 168* 134*    Meds Scheduled Meds:  acetaminophen  1,000 mg Oral Q6H   Or   acetaminophen (TYLENOL) oral liquid 160 mg/5 mL  1,000 mg Oral Q6H   albuterol  2.5 mg Nebulization QID   aspirin EC  81 mg Oral Daily   bisacodyl  10 mg Oral Daily   Chlorhexidine Gluconate Cloth  6 each Topical Q0600   dapagliflozin propanediol  10 mg Oral Daily   diltiazem  120 mg Oral Daily   enoxaparin (LOVENOX) injection  40 mg Subcutaneous Q24H   fenofibrate  160 mg Oral Daily   lisinopril  20 mg Oral Daily   And   hydrochlorothiazide  25 mg Oral Daily   hydrOXYzine  25 mg Oral Daily   insulin aspart  0-24 Units Subcutaneous Q4H   isosorbide mononitrate  30 mg Oral Daily   pantoprazole  40 mg Oral BID   pravastatin  20 mg Oral QHS   senna-docusate  1 tablet Oral QHS   Continuous Infusions:  sodium chloride 100 mL/hr at 10/26/22 0113   PRN Meds:.fentaNYL (SUBLIMAZE) injection, ondansetron (ZOFRAN) IV, oxyCODONE, simethicone, traMADol  Xrays DG Chest Port 1 View  Result Date: 10/25/2022 CLINICAL DATA:  [id number] Lung nodules  [id number] EXAM: PORTABLE CHEST 1 VIEW COMPARISON:  Oct 23, 2022 FINDINGS: The cardiomediastinal silhouette is unchanged in contour.RIGHT-sided chest tube. No pleural effusion. Mild to moderate RIGHT pneumothorax. RIGHT-sided soft tissue air. Trapezoid opacity overlying the RIGHT hilar border superiorly, likely postsurgical. Surgical sutures. IMPRESSION: Mild to moderate RIGHT pneumothorax status post chest tube placement. Favored postsurgical change along the RIGHT hilar border. Recommend attention on follow-up. Electronically Signed   By: Meda Klinefelter M.D.   On: 10/25/2022 15:19   DG C-ARM BRONCHOSCOPY  Result Date: 10/25/2022 C-ARM BRONCHOSCOPY: Fluoroscopy was utilized by the requesting physician.  No radiographic interpretation.    Assessment/Plan: S/P Procedure(s) (LRB): XI ROBOTIC ASSISTED THORACOSCOPY-RIGHT UPPER AND LOWER  LOBE WEDGE RESECTIONS (Right) VIDEO BRONCHOSCOPY WITH ENDOBRONCHIAL NAVIGATION (N/A) LYMPH NODE BIOPSY (N/A) INTERCOSTAL NERVE BLOCK POD#1  1 afeb, HR 47-77 range, s BP 97-141, sinus rhythm/brady 2 O2 sats good on 2 liters 3 CT 142 ml drainage, no air leak 4 good UOP 5 CXR ? Small apical pntx, some sq air- will see what radiology read says- poss remove CT later today 6 add flutter valve 7 creat about baseline 8 minor hyponatremia- IV to KVO 9 minor reactive leukocytosis - monitor clinically 10 minoe expected ABLA- monitor 11 routine pulm hygiene and rehab   LOS: 1 day    Rowe Clack PA-C Pager [phone] 10/26/2022  Patient seen and examined, agree with above No air leak but can hear movement in tubing I discussed intraoperative findings with Ms. Gamba. Tumor marking without difficulty and resected marked areas but no definitive findings on initial path assessment. Will get Ct today to see if nodule was removed  Viviann Spare C. Dorris Fetch, MD [facility] [phone]

## 2022-10-26 NOTE — Discharge Summary (Addendum)
Physician Discharge Summary       [address]       [place] [address]             [phone]    Patient ID: Audrey Green MRN: [id number] DOB/AGE: [birth year] 77 y.o.  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   POSTOPERATIVE DIAGNOSIS:  Right upper and lower lobe lung nodules.   PROCEDURE:  Xi robotic-assisted right VATS right upper and lower lobe wedge resections, lymph node biopsy and intercostal nerve blocks levels 3 through 10.   SURGEON:  Salvatore Decent. Dorris Fetch, MD   ASSISTANT:  Gershon Crane, PA   ANESTHESIA:  General.   FINDINGS:  Both sites marked by navigational bronchoscopy, easily visible resected with a 2 cm gross margin.  No tumor seen at either site.  Additional biopsies taken for permanent pathology.   HPI: At time of CT surgical office visit   Audrey Green returns for follow-up of multiple lung nodules.   Audrey Green is a 77 year old woman with a past history significant for bilateral lung nodules, remote tobacco use (quit 15 years ago), hypertension, hyperlipidemia, aortic atherosclerosis, heart murmur, insulin-dependent type 2 diabetes, stage IV chronic kidney disease, hepatitis C, hepatic fibrosis, and osteoporosis.   She has been followed for many years for bilateral groundglass opacities.  Initially by Dr. Edwyna Shell and then by Dr. Tyrone Sage.  She last saw Dr. Tyrone Sage in 2020.   She has been feeling well.  Denies any chest pain, pressure, or tightness either at rest or with exertion.  He is short of breath with heavy exertion but not with normal activities.   No cough or wheezing.  No change in appetite or weight loss.  Dr. Dorris Fetch evaluated the patient and most recent relevant studies and recommended proceeding with surgical intervention.  Hospital course patient was admitted on 10/25/2022 and taken the operating room following navigational marking and underwent a right robotic assisted thoracoscopy for multiple wedge resection samples and lymph node sampling x 2.  She tolerated the procedure well was taken the PACU in stable condition.  Postoperative hospital course:  Patient has remained hemodynamically stable.  Chest tube was kept in place on postoperative day 1.  There was no definite air leak but movement in the tubing was noted so it was kept in place.  There is some concern that initial pathology did not show findings related to the nodules and a CT scan is going to be obtained on postop day 1 to make sure they were obtained.  This scan did not show evidence that the lesions were not obtained.  Creatinine is elevated but at preoperative baseline.  She does have a minor expected acute blood loss anemia which is being monitored clinically.  She has remained hemodynamically stable in sinus rhythm.  Oxygen saturations did dip into the 80s with ambulation and home oxygen is being arranged.  Chest tube was removed on postop day #2 and x-ray has remained stable.  She does have some constipation is being treated with laxatives.  Incisions are healing well without evidence of infection.  She is tolerating diet.  He is  tolerating gradually increasing activities using standard rehab protocols. On post-op day 4 she was noted to have developed acute renal insufficiency with serum creatinine increasing to 3.6. We requested nephrology consultation and Audrey Green was evaluated by Dr Juel Burrow.  Renal ultrasound was unremarkable with no hydronephrosis. She continued to make urine but this was difficult to quantify due to frequent bowel movements. Repeat creatinine on post-op  day 5 had improved to 2.8 and continued to trend back to the pre-op baseline of ~2.0.  She was started back on her lisinopril and creat remained stable. She was ready for discharge to home  from surgery standpoint on post-op day 7 and nephrology agreed. Audrey Green and Audrey Green will be resumed at discharge per nephrology recs.    Latest Vital Signs: Blood pressure (!) 104/59, pulse 69, temperature 98.4 F (36.9 C), temperature source Oral, resp. rate (!) 21, height 5\' 3"  (1.6 m), weight 65.8 kg, SpO2 96 %.  Physical Exam:General appearance: alert, cooperative, and no distress Neurologic: intact Heart: RRR Lungs: breath sounds full and clear.  Abdomen: soft, no tenderness Wound: the robotic port sites and CT exit site are dry.   Discharge Condition: Discharged to home in stable condition.   Recent laboratory studies:  Lab Results  Component Value Date   WBC 11.2 (H) 10/27/2022   HGB 10.6 (L) 10/27/2022   HCT 34.6 (L) 10/27/2022   MCV 86.9 10/27/2022   PLT 323 10/27/2022   Lab Results  Component Value Date   NA 131 (L) 11/01/2022   K 4.4 11/01/2022   CL 99 11/01/2022   CO2 23 11/01/2022   CREATININE 2.08 (H) 11/01/2022   GLUCOSE 107 (H) 11/01/2022      Diagnostic Studies: DG Chest 2 View  Result Date: 10/30/2022 CLINICAL DATA:  Status post partial right lung resection. EXAM: CHEST - 2 VIEW COMPARISON:  10/29/2022 FINDINGS: Cardiomediastinal contours are stable. Postoperative changes identified within the right mid and right upper lung. No pneumothorax identified. Asymmetric right lung volume loss is noted with elevation of the right hemidiaphragm. Left lower lung atelectasis is similar to the previous exam. IMPRESSION: 1. Status post partial right lung resection. No pneumothorax. 2. Left lower lung atelectasis. Electronically Signed   By: Signa Kell M.D.   On: 10/30/2022 08:20   US RENAL  Result Date: 10/29/2022 CLINICAL DATA:  Renal dysfunction EXAM: RENAL / URINARY TRACT  ULTRASOUND COMPLETE COMPARISON:  07/27/2022 FINDINGS: Right Kidney: Renal measurements: 10.1 x 5.1 x 3.9 cm = volume: 106.3 mL. Echogenicity within normal limits. No mass or hydronephrosis visualized. Left Kidney: Renal measurements: 9.1 x 4.5 x 3.7 cm = volume: 77.6 mL. Technologist observed increased cortical echogenicity, possibly technical artifact. No mass or hydronephrosis visualized. Bladder: Appears normal for degree of bladder distention. Other: None. IMPRESSION: There is no hydronephrosis. Electronically Signed   By: Ernie Avena M.D.   On: 10/29/2022 17:17   DG Chest Port 1 View  Result Date: 10/29/2022 CLINICAL DATA:  [id number] S/P partial lobectomy of lung [phone] EXAM: PORTABLE CHEST - 1 VIEW COMPARISON:  the previous day's study FINDINGS: Persistent low lung volumes. Stable linear scarring or atelectasis at the left lung base. Staple lines at the right hilum. Heart size and mediastinal contours are within normal limits. No effusion. Visualized bones unremarkable.  Right upper quadrant clips. IMPRESSION: Low volumes. No acute findings. Electronically Signed   By: Corlis Leak M.D.   On: 10/29/2022 10:36   DG Chest 2 View  Result Date: 10/28/2022 CLINICAL DATA:  Partial right lung resection 5/16. Chest tube removal. EXAM: CHEST - 2 VIEW COMPARISON:  Yesterday FINDINGS: Suboptimal lateral view secondary to patient positioning and overlying artifact. Wires and leads project over the chest on the frontal radiograph. Decreased right-sided subcutaneous emphysema. Moderate right hemidiaphragm elevation. Normal heart size. No pleural effusion or pneumothorax. Low lung volumes with resultant pulmonary interstitial prominence. Surgical sutures in the right infrahilar region. Minimal bibasilar subsegmental atelectasis. IMPRESSION: Low lung volumes with right hemithorax volume loss but no pneumothorax or other acute process. Decreased subcutaneous emphysema about the right chest wall. Electronically  Signed   By: Jeronimo Greaves M.D.   On: 10/28/2022 10:47   DG Chest 1V REPEAT Same Day  Result Date: 10/27/2022 CLINICAL DATA:  Status post partial right lung resection, chest tube removal EXAM: CHEST - 1 VIEW SAME DAY COMPARISON:  10/27/2022 FINDINGS: Single frontal view of the chest demonstrates interval removal of the right-sided chest tube. There is subcutaneous gas within the right lateral chest wall. No evidence of pneumothorax. The cardiac silhouette is unremarkable. Postsurgical changes from partial right pneumonectomy. Stable elevation of the right hemidiaphragm. Left basilar subsegmental atelectasis. No effusion or pneumothorax. IMPRESSION: 1. No complication after right chest tube removal. No evidence of pneumothorax. 2. Left basilar subsegmental atelectasis. 3. Postsurgical changes from partial right lung resection. Electronically Signed   By: Sharlet Salina M.D.   On: 10/27/2022 18:07   DG Chest 1 View  Result Date: 10/27/2022 CLINICAL DATA:  77 year old female with history of surgery for lung nodules. Follow-up study. EXAM: CHEST  1 VIEW COMPARISON:  Chest x-ray 10/26/2022. FINDINGS: Chest tube in position with tip in the apex of the right hemithorax, unchanged. No definite residual pneumothorax confidently identified. Subcutaneous emphysema again noted in the right chest wall. Lung volumes are low. Elevation of the right hemidiaphragm is unchanged. Bibasilar opacities which may reflect areas of atelectasis and/or consolidation. Trace left pleural effusion. No definite right pleural effusion. No pneumothorax. Chain suture in the right perihilar region with persistent mass-like opacity in this region, likely reflective of postoperative scarring (better demonstrated on recent chest CT). Heart size is normal. Mediastinal contours are distorted by patient positioning. IMPRESSION: 1. Postoperative changes and support apparatus, as above. No residual right pneumothorax confidently identified. 2. Low  lung volumes with bibasilar areas of probable subsegmental atelectasis. 3. Trace left pleural effusion. Electronically Signed   By: Trudie Reed M.D.   On: 10/27/2022 07:45   CT CHEST WO CONTRAST  Result Date: 10/26/2022 CLINICAL DATA:  Evaluation of pneumothorax status post right lung wedge resection EXAM: CT CHEST WITHOUT CONTRAST TECHNIQUE: Multidetector CT imaging of the chest was performed following the standard protocol without IV contrast. RADIATION DOSE REDUCTION: This exam was performed according to the departmental dose-optimization program which includes automated exposure control, adjustment of the mA and/or kV according to patient size and/or use of iterative reconstruction technique. COMPARISON:  Chest radiograph dated 10/26/2022, CT chest dated 10/01/2022 FINDINGS: Cardiovascular: Normal heart size. No significant pericardial fluid/thickening. Great vessels are normal in course and caliber. Coronary artery calcifications and aortic atherosclerosis. Mediastinum/Nodes: Imaged thyroid gland without nodules meeting criteria for imaging follow-up by size. Normal esophagus. No pathologically enlarged axillary, supraclavicular, mediastinal, or hilar lymph nodes. Lungs/Pleura: Right inferior anterolateral approach pleural catheter terminates along the posterior right apex. The central airways are patent. Status post right upper and lower lobe wedge resections with rounded atelectasis/consolidation adjacent to the surgical chain sutures in the posterior right upper lobe and lateral right lower lobe  at the sites of previously noted nodules. Unchanged 9 mm medial left upper lobe ground-glass nodule (6:38). Left lower lobe subsegmental atelectasis. Trace right pneumothorax at the apex and medial base. Trace left pleural effusion. Upper abdomen: Cholecystectomy. Musculoskeletal: No acute or abnormal lytic or blastic osseous lesions. Moderate volume subcutaneous emphysema along the right lateral chest wall  and flank. IMPRESSION: 1. Status post right upper and lower lobe wedge resections with rounded atelectasis/consolidation adjacent to the surgical chain sutures in the posterior right upper lobe and lateral right lower lobe at the sites of previously noted nodules. 2. Trace right pneumothorax at the apex and medial base. Right inferior anterolateral approach pleural catheter terminates along the posterior right apex. 3. Trace left pleural effusion. 4. Unchanged 9 mm medial left upper lobe ground-glass nodule. 5. Moderate volume subcutaneous emphysema along the right lateral chest wall and flank. 6. Aortic Atherosclerosis (ICD10-I70.0). Electronically Signed   By: Agustin Cree M.D.   On: 10/26/2022 18:27   DG Chest Port 1 View  Result Date: 10/26/2022 CLINICAL DATA:  77 year old female postoperative day 1 status post right lung wedge resection, lymph node biopsy. EXAM: PORTABLE CHEST 1 VIEW COMPARISON:  Portable chest 10/25/2022 and earlier. FINDINGS: Portable AP upright view at 0655 hours. Stable right chest tube coursing to the lung apex. Regressed right pneumothorax, pleural edge now just below the 3rd posterior rib. Small volume residual. Right chest wall subcutaneous gas has mildly increased. Stable lung volumes. Stable cardiac size and mediastinal contours. No new pulmonary opacity. Stable cholecystectomy clips. Negative visible bowel gas. No acute osseous abnormality identified. IMPRESSION: 1. Stable right chest tube and decreased right pneumothorax, small residual. 2. No new cardiopulmonary abnormality. Electronically Signed   By: Odessa Fleming M.D.   On: 10/26/2022 12:25   DG Chest Port 1 View  Result Date: 10/25/2022 CLINICAL DATA:  [id number] Lung nodules [id number] EXAM: PORTABLE CHEST 1 VIEW COMPARISON:  Oct 23, 2022 FINDINGS: The cardiomediastinal silhouette is unchanged in contour.RIGHT-sided chest tube. No pleural effusion. Mild to moderate RIGHT pneumothorax. RIGHT-sided soft tissue air. Trapezoid opacity  overlying the RIGHT hilar border superiorly, likely postsurgical. Surgical sutures. IMPRESSION: Mild to moderate RIGHT pneumothorax status post chest tube placement. Favored postsurgical change along the RIGHT hilar border. Recommend attention on follow-up. Electronically Signed   By: Meda Klinefelter M.D.   On: 10/25/2022 15:19   DG C-ARM BRONCHOSCOPY  Result Date: 10/25/2022 C-ARM BRONCHOSCOPY: Fluoroscopy was utilized by the requesting physician.  No radiographic interpretation.   DG Chest 2 View  Result Date: 10/25/2022 CLINICAL DATA:  [id number]. Preoperative chest x-ray. Multiple lung nodules on CT. EXAM: CHEST - 2 VIEW COMPARISON:  Chest CT 10/01/2022 showing an enlarging part solid nodule posteriorly in the right upper lobe measuring 2 cm. FINDINGS: The enlarging ground-glass nodule in the posteromedial right upper lobe is not identifiable radiographically. The other stable, smaller ground-glass nodules are also not seen on x-rays. No focal pneumonia is evident. The sulci are sharp. There is mild chronic elevation of the right hemidiaphragm. The cardiomediastinal silhouette is normal. There is patchy calcification of the transverse aorta. There is mild osteopenia with degenerative changes of the spine, slight thoracic kyphodextroscoliosis. IMPRESSION: The enlarging ground-glass nodule in the posteromedial right upper lobe is not identifiable radiographically The other smaller ground-glass nodules are also not seen on x-rays. No acute radiographic chest findings. Aortic atherosclerosis. Electronically Signed   By: Almira Bar M.D.   On: 10/25/2022 06:06         Discharge Medications:  Allergies as of 11/01/2022       Reactions   Atorvastatin    Other reaction(s): Muscle Pain   Codeine Hives   Penicillins Hives, Swelling   Tongue and throat swelling Has patient had a PCN reaction causing immediate rash, facial/tongue/throat swelling, SOB or lightheadedness with hypotension: Yes Has  patient had a PCN reaction causing severe rash involving mucus membranes or skin necrosis: Yes Has patient had a PCN reaction that required hospitalization Yes Has patient had a PCN reaction occurring within the last 10 years: No If all of the above answers are "NO", then may proceed with Cephalosporin use.   Sulfa Antibiotics Hives        Medication List     STOP taking these medications    meloxicam 15 MG tablet Commonly known as: MOBIC   ranitidine 300 MG capsule Commonly known as: ZANTAC       TAKE these medications    aspirin EC 81 MG tablet Take 81 mg by mouth daily.   cetirizine 10 MG tablet Commonly known as: ZYRTEC Take 10 mg by mouth at bedtime.   Dexcom G7 Sensor Misc Use 1 each every 10 (ten) days to check blood sugars 6 times per day   diltiazem 120 MG 24 hr capsule Commonly known as: CARDIZEM CD Take 120 mg by mouth daily.   esomeprazole 40 MG capsule Commonly known as: NexIUM TAKE 1 CAPSULE (40MG  TOTAL ) BY MOUTH 2 TIMES DAILY   Farxiga 10 MG Tabs tablet Generic drug: dapagliflozin propanediol Take 10 mg by mouth daily.   fenofibrate 145 MG tablet Commonly known as: TRICOR TAKE 1 TABLET (145 MG) BY ORAL ROUTE ONCE DAILY   fluticasone 50 MCG/ACT nasal spray Commonly known as: FLONASE Place 2 sprays into both nostrils daily as needed for allergies.   glucose blood test strip Commonly known as: ONE TOUCH ULTRA TEST Check blood sugar three times daily and as directed. Dx 250.4   hydrOXYzine 25 MG tablet Commonly known as: ATARAX Take 25 mg by mouth daily.   Insulin Pen Needle 31G X 6 MM Misc Commonly known as: UltiCare Mini Pen Needles Use to inject insulin daily   isosorbide mononitrate 30 MG 24 hr tablet Commonly known as: IMDUR Take 30 mg by mouth daily.   lisinopril-hydrochlorothiazide 20-25 MG tablet Commonly known as: [place] Take 1 tablet by mouth daily.   multivitamin with minerals Tabs tablet Take 1 tablet by mouth  daily. Centrum Silver   NovoLOG FlexPen 100 UNIT/ML FlexPen Generic drug: insulin aspart Inject 10-15 Units into the skin 3 (three) times daily before meals.   ONE TOUCH LANCETS Misc Use to check blood sugar twice a day   Ozempic (1 MG/DOSE) 4 MG/3ML Sopn Generic drug: Semaglutide (1 MG/DOSE) Inject 1 mg into the skin every Saturday.   pantoprazole 40 MG tablet Commonly known as: PROTONIX Take 40 mg by mouth 2 (two) times daily.   pravastatin 20 MG tablet Commonly known as: PRAVACHOL Take 20 mg by mouth at bedtime.   traMADol 50 MG tablet Commonly known as: ULTRAM Take 1 tablet (50 mg total) by mouth every 6 (six) hours as needed for up to 7 days for severe pain.   Evaristo Bury FlexTouch 200 UNIT/ML FlexTouch Pen Generic drug: insulin degludec Inject 44 Units into the skin at bedtime.   Vitamin D-1000 Max St 25 MCG (1000 UT) tablet Generic drug: Cholecalciferol Take 1,000 Units by mouth daily.  Durable Medical Equipment  (From admission, onward)           Start     Ordered   10/29/22 1032  For home use only DME oxygen  Once       Question Answer Comment  Length of Need 6 Months   Mode or (Route) Nasal cannula   Liters per Minute 2   Frequency Continuous (stationary and portable oxygen unit needed)   Oxygen delivery system Gas      10/29/22 1031            Follow Up Appointments:  Follow-up Information     Loreli Slot, MD. Go on 11/01/2022.   Specialty: Cardiothoracic Surgery Why: Your appointment is for 3:45. Please obtain a chest x-ray 1 hour before the appointment at [facility] located at [address] Contact information: [address] [employer] [address] [place] [address] [phone]         [facility]. Go on 11/08/2022.   Why: On the date you see Dr. Dorris Fetch please obtain a chest x-ray at [facility] 1 hour prior to the appointment (2:45pm). Contact information: [address]  [place] [address]                Signed: Leary Roca, PA-C 11/01/2022, 9:13 AM

## 2022-10-27 ENCOUNTER — Inpatient Hospital Stay (HOSPITAL_COMMUNITY): Payer: Medicare HMO

## 2022-10-27 LAB — COMPREHENSIVE METABOLIC PANEL
ALT: 25 U/L (ref 0–44)
AST: 26 U/L (ref 15–41)
Albumin: 3.2 g/dL — ABNORMAL LOW (ref 3.5–5.0)
Alkaline Phosphatase: 45 U/L (ref 38–126)
Anion gap: 12 (ref 5–15)
BUN: 32 mg/dL — ABNORMAL HIGH (ref 8–23)
CO2: 20 mmol/L — ABNORMAL LOW (ref 22–32)
Calcium: 8 mg/dL — ABNORMAL LOW (ref 8.9–10.3)
Chloride: 100 mmol/L (ref 98–111)
Creatinine, Ser: 2.24 mg/dL — ABNORMAL HIGH (ref 0.44–1.00)
GFR, Estimated: 22 mL/min — ABNORMAL LOW (ref >60)
Glucose, Bld: 213 mg/dL — ABNORMAL HIGH (ref 70–99)
Potassium: 4.3 mmol/L (ref 3.5–5.1)
Sodium: 132 mmol/L — ABNORMAL LOW (ref 135–145)
Total Bilirubin: 0.5 mg/dL (ref 0.3–1.2)
Total Protein: 6.2 g/dL — ABNORMAL LOW (ref 6.5–8.1)

## 2022-10-27 LAB — CBC
HCT: 34.6 % — ABNORMAL LOW (ref 36.0–46.0)
Hemoglobin: 10.6 g/dL — ABNORMAL LOW (ref 12.0–15.0)
MCH: 26.6 pg (ref 26.0–34.0)
MCHC: 30.6 g/dL (ref 30.0–36.0)
MCV: 86.9 fL (ref 80.0–100.0)
Platelets: 323 10*3/uL (ref 150–400)
RBC: 3.98 MIL/uL (ref 3.87–5.11)
RDW: 13.2 % (ref 11.5–15.5)
WBC: 11.2 10*3/uL — ABNORMAL HIGH (ref 4.0–10.5)
nRBC: 0 % (ref 0.0–0.2)

## 2022-10-27 LAB — GLUCOSE, CAPILLARY
Glucose-Capillary: 196 mg/dL — ABNORMAL HIGH (ref 70–99)
Glucose-Capillary: 110 mg/dL — ABNORMAL HIGH (ref 70–99)
Glucose-Capillary: 146 mg/dL — ABNORMAL HIGH (ref 70–99)
Glucose-Capillary: 103 mg/dL — ABNORMAL HIGH (ref 70–99)

## 2022-10-27 MED ORDER — CHLORHEXIDINE GLUCONATE CLOTH 2 % EX PADS
6.0000 | MEDICATED_PAD | Freq: Every day | CUTANEOUS | Status: DC
  Administered 2022-10-27 – 2022-10-30 (×3): 6 via TOPICAL

## 2022-10-27 MED ORDER — INSULIN DETEMIR 100 UNIT/ML ~~LOC~~ SOLN
30.0000 [IU] | Freq: Two times a day (BID) | SUBCUTANEOUS | Status: DC
  Administered 2022-10-27 – 2022-10-28 (×3): 30 [IU] via SUBCUTANEOUS
  Filled 2022-10-27 (×5): qty 0.3

## 2022-10-27 NOTE — Plan of Care (Signed)
  Problem: Activity: Goal: Risk for activity intolerance will decrease Outcome: Progressing   Problem: Cardiac: Goal: Will achieve and/or maintain hemodynamic stability Outcome: Progressing   Problem: Respiratory: Goal: Respiratory status will improve Outcome: Progressing   Problem: Pain Management: Goal: Pain level will decrease Outcome: Progressing

## 2022-10-27 NOTE — Progress Notes (Signed)
Mobility Specialist: Progress Note   10/27/22 1226  Mobility  Activity Ambulated with assistance in hallway  Level of Assistance Minimal assist, patient does 75% or more  Assistive Device None  Distance Ambulated (ft) 80 ft (40'x2)  Activity Response Tolerated fair  Mobility Referral Yes  $Mobility charge 1 Mobility  Mobility Specialist Start Time (ACUTE ONLY) 1200  Mobility Specialist Stop Time (ACUTE ONLY) 1223  Mobility Specialist Time Calculation (min) (ACUTE ONLY) 23 min   Pre-Mobility on 1 L/min [place]: 70 HR, 96% SpO2 During Mobility on RA: 83 HR, 87-92% SpO2 Post-Mobility on 1 L/min [place]: 83 HR, 137/51 (73) BP, 92% SpO2  Pt received in the bed and agreeable to mobility. MinA with bed mobility as well as to stand. Contact guard during ambulation for balance d/t lateral unsteadiness. Stopped x1 for seated break secondary to SOB, sats as seen above. Pt to the chair after session with call bell and phone in reach.   Anuel Sitter Mobility Specialist Please contact via SecureChat or Rehab office at [phone]

## 2022-10-27 NOTE — Progress Notes (Signed)
2 Days Post-Op Procedure(s) (LRB): XI ROBOTIC ASSISTED THORACOSCOPY-RIGHT UPPER AND LOWER LOBE WEDGE RESECTIONS (Right) VIDEO BRONCHOSCOPY WITH ENDOBRONCHIAL NAVIGATION (N/A) LYMPH NODE BIOPSY (N/A) INTERCOSTAL NERVE BLOCK Subjective: Had a rough night due to "gas" No incisional pain  Objective: Vital signs in last 24 hours: Temp:  [97.6 F (36.4 C)-98.1 F (36.7 C)] 97.6 F (36.4 C) (05/18 0732) Pulse Rate:  [62-84] 69 (05/18 0732) Cardiac Rhythm: Normal sinus rhythm (05/18 0830) Resp:  [14-20] 14 (05/18 0732) BP: (105-138)/(51-85) 108/56 (05/18 0732) SpO2:  [90 %-99 %] 93 % (05/18 0732)  Hemodynamic parameters for last 24 hours:    Intake/Output from previous day: 05/17 0701 - 05/18 0700 In: 1105.7 [P.O.:600; I.V.:505.7] Out: 260 [Chest Tube:260] Intake/Output this shift: No intake/output data recorded.  General appearance: alert, cooperative, and no distress Neurologic: intact Heart: regular rate and rhythm Lungs: diminished breath sounds right base No air leak  Lab Results: Recent Labs    10/26/22 0014 10/27/22 0011  WBC 12.8* 11.2*  HGB 10.5* 10.6*  HCT 32.5* 34.6*  PLT 283 323   BMET:  Recent Labs    10/26/22 0014 10/27/22 0011  NA 132* 132*  K 4.5 4.3  CL 102 100  CO2 20* 20*  GLUCOSE 165* 213*  BUN 28* 32*  CREATININE 2.00* 2.24*  CALCIUM 7.8* 8.0*    PT/INR: No results for input(s): "LABPROT", "INR" in the last 72 hours. ABG    Component Value Date/Time   PHART 7.326 (L) 10/25/2022 1033   HCO3 22.4 10/25/2022 1033   TCO2 24 10/25/2022 1033   ACIDBASEDEF 4.0 (H) 10/25/2022 1033   O2SAT 82 10/25/2022 1033   CBG (last 3)  Recent Labs    10/26/22 1615 10/26/22 2129 10/27/22 0628  GLUCAP 170* 217* 196*    Assessment/Plan: S/P Procedure(s) (LRB): XI ROBOTIC ASSISTED THORACOSCOPY-RIGHT UPPER AND LOWER LOBE WEDGE RESECTIONS (Right) VIDEO BRONCHOSCOPY WITH ENDOBRONCHIAL NAVIGATION (N/A) LYMPH NODE BIOPSY (N/A) INTERCOSTAL NERVE  BLOCK POD # 2 No air leak- dc chest tube CT showed bibasilar atelectasis, R>L.  Staple lines with surrounding soft tissue at sites of nodules on previous CT. Small pneumo SCD + Enoxaparin CBG elevated- increase Levemir AMBULATE Creatinine up slightly 2.24 (baseline 2.06)    LOS: 2 days    Loreli Slot 10/27/2022

## 2022-10-28 ENCOUNTER — Inpatient Hospital Stay (HOSPITAL_COMMUNITY): Payer: Medicare HMO

## 2022-10-28 LAB — GLUCOSE, CAPILLARY
Glucose-Capillary: 78 mg/dL (ref 70–99)
Glucose-Capillary: 77 mg/dL (ref 70–99)
Glucose-Capillary: 112 mg/dL — ABNORMAL HIGH (ref 70–99)
Glucose-Capillary: 98 mg/dL (ref 70–99)
Glucose-Capillary: 150 mg/dL — ABNORMAL HIGH (ref 70–99)

## 2022-10-28 MED ORDER — GLUCOSE BLOOD VI STRP: ORAL_STRIP | 5 refills | Status: AC

## 2022-10-28 MED ORDER — FLUTICASONE PROPIONATE 50 MCG/ACT NA SUSP: 2.0000 | Freq: Every day | NASAL | Status: AC | PRN

## 2022-10-28 MED ORDER — TRAMADOL HCL 50 MG PO TABS: 50.0000 mg | ORAL_TABLET | Freq: Four times a day (QID) | ORAL | 0 refills | Status: AC | PRN

## 2022-10-28 MED ORDER — LACTULOSE 10 GM/15ML PO SOLN
20.0000 g | Freq: Every day | ORAL | Status: DC | PRN
  Administered 2022-10-28 – 2022-10-29 (×2): 20 g via ORAL
  Filled 2022-10-28 (×2): qty 30

## 2022-10-28 NOTE — Progress Notes (Addendum)
[address]       [place] [address]             [phone]     3 Days Post-Op Procedure(s) (LRB): XI ROBOTIC ASSISTED THORACOSCOPY-RIGHT UPPER AND LOWER LOBE WEDGE RESECTIONS (Right) VIDEO BRONCHOSCOPY WITH ENDOBRONCHIAL NAVIGATION (N/A) LYMPH NODE BIOPSY (N/A) INTERCOSTAL NERVE BLOCK Subjective: + cough, some blood tinged sputum Feels like she needs a BM  Objective: Vital signs in last 24 hours: Temp:  [98 F (36.7 C)-98.2 F (36.8 C)] 98.2 F (36.8 C) (05/19 0712) Pulse Rate:  [67-80] 80 (05/19 0712) Cardiac Rhythm: Normal sinus rhythm (05/18 2000) Resp:  [12-20] 18 (05/19 0712) BP: (100-137)/(43-67) 137/54 (05/19 0712) SpO2:  [92 %-96 %] 92 % (05/19 0712)  Hemodynamic parameters for last 24 hours:    Intake/Output from previous day: 05/18 0701 - 05/19 0700 In: 300 [P.O.:300] Out: -  Intake/Output this shift: No intake/output data recorded.  General appearance: alert, cooperative, fatigued, and no distress Heart: regular rate and rhythm Lungs: slightly dim in bases Abdomen: benign Extremities: no edema or calf tenderness Wound: incis healing well  Lab Results: Recent Labs    10/26/22 0014 10/27/22 0011  WBC 12.8* 11.2*  HGB 10.5* 10.6*  HCT 32.5* 34.6*  PLT 283 323   BMET:  Recent Labs    10/26/22 0014 10/27/22 0011  NA 132* 132*  K 4.5 4.3  CL 102 100  CO2 20* 20*  GLUCOSE 165* 213*  BUN 28* 32*  CREATININE 2.00* 2.24*  CALCIUM 7.8* 8.0*    PT/INR: No results for input(s): "LABPROT", "INR" in the last 72 hours. ABG    Component Value Date/Time   PHART 7.326 (L) 10/25/2022 1033   HCO3 22.4 10/25/2022 1033   TCO2 24 10/25/2022 1033   ACIDBASEDEF 4.0 (H) 10/25/2022 1033   O2SAT 82 10/25/2022 1033   CBG (last 3)  Recent Labs    10/27/22 2129 10/28/22 0642 10/28/22 0827  GLUCAP 103* 78 77    Meds Scheduled Meds:  acetaminophen  1,000 mg Oral Q6H   Or   acetaminophen (TYLENOL) oral liquid 160 mg/5 mL   1,000 mg Oral Q6H   aspirin EC  81 mg Oral Daily   bisacodyl  10 mg Oral Daily   Chlorhexidine Gluconate Cloth  6 each Topical Daily   dapagliflozin propanediol  10 mg Oral Daily   diltiazem  120 mg Oral Daily   enoxaparin (LOVENOX) injection  30 mg Subcutaneous Q24H   fenofibrate  160 mg Oral Daily   lisinopril  20 mg Oral Daily   And   hydrochlorothiazide  25 mg Oral Daily   hydrOXYzine  25 mg Oral Daily   insulin aspart  0-15 Units Subcutaneous TID WC   insulin aspart  0-5 Units Subcutaneous QHS   insulin detemir  30 Units Subcutaneous BID   isosorbide mononitrate  30 mg Oral Daily   pantoprazole  40 mg Oral BID   pravastatin  20 mg Oral QHS   senna-docusate  1 tablet Oral QHS   Continuous Infusions:  sodium chloride 10 mL/hr at 10/26/22 1500   PRN Meds:.albuterol, fentaNYL (SUBLIMAZE) injection, ondansetron (ZOFRAN) IV, oxyCODONE, simethicone, traMADol  Xrays DG Chest 1V REPEAT Same Day  Result Date: 10/27/2022 CLINICAL DATA:  Status post partial right lung resection, chest tube removal EXAM: CHEST - 1 VIEW SAME DAY COMPARISON:  10/27/2022 FINDINGS: Single frontal view of the chest demonstrates interval removal of the right-sided chest tube.  There is subcutaneous gas within the right lateral chest wall. No evidence of pneumothorax. The cardiac silhouette is unremarkable. Postsurgical changes from partial right pneumonectomy. Stable elevation of the right hemidiaphragm. Left basilar subsegmental atelectasis. No effusion or pneumothorax. IMPRESSION: 1. No complication after right chest tube removal. No evidence of pneumothorax. 2. Left basilar subsegmental atelectasis. 3. Postsurgical changes from partial right lung resection. Electronically Signed   By: Sharlet Salina M.D.   On: 10/27/2022 18:07   DG Chest 1 View  Result Date: 10/27/2022 CLINICAL DATA:  77 year old female with history of surgery for lung nodules. Follow-up study. EXAM: CHEST  1 VIEW COMPARISON:  Chest x-ray  10/26/2022. FINDINGS: Chest tube in position with tip in the apex of the right hemithorax, unchanged. No definite residual pneumothorax confidently identified. Subcutaneous emphysema again noted in the right chest wall. Lung volumes are low. Elevation of the right hemidiaphragm is unchanged. Bibasilar opacities which may reflect areas of atelectasis and/or consolidation. Trace left pleural effusion. No definite right pleural effusion. No pneumothorax. Chain suture in the right perihilar region with persistent mass-like opacity in this region, likely reflective of postoperative scarring (better demonstrated on recent chest CT). Heart size is normal. Mediastinal contours are distorted by patient positioning. IMPRESSION: 1. Postoperative changes and support apparatus, as above. No residual right pneumothorax confidently identified. 2. Low lung volumes with bibasilar areas of probable subsegmental atelectasis. 3. Trace left pleural effusion. Electronically Signed   By: Trudie Reed M.D.   On: 10/27/2022 07:45   CT CHEST WO CONTRAST  Result Date: 10/26/2022 CLINICAL DATA:  Evaluation of pneumothorax status post right lung wedge resection EXAM: CT CHEST WITHOUT CONTRAST TECHNIQUE: Multidetector CT imaging of the chest was performed following the standard protocol without IV contrast. RADIATION DOSE REDUCTION: This exam was performed according to the departmental dose-optimization program which includes automated exposure control, adjustment of the mA and/or kV according to patient size and/or use of iterative reconstruction technique. COMPARISON:  Chest radiograph dated 10/26/2022, CT chest dated 10/01/2022 FINDINGS: Cardiovascular: Normal heart size. No significant pericardial fluid/thickening. Great vessels are normal in course and caliber. Coronary artery calcifications and aortic atherosclerosis. Mediastinum/Nodes: Imaged thyroid gland without nodules meeting criteria for imaging follow-up by size. Normal  esophagus. No pathologically enlarged axillary, supraclavicular, mediastinal, or hilar lymph nodes. Lungs/Pleura: Right inferior anterolateral approach pleural catheter terminates along the posterior right apex. The central airways are patent. Status post right upper and lower lobe wedge resections with rounded atelectasis/consolidation adjacent to the surgical chain sutures in the posterior right upper lobe and lateral right lower lobe at the sites of previously noted nodules. Unchanged 9 mm medial left upper lobe ground-glass nodule (6:38). Left lower lobe subsegmental atelectasis. Trace right pneumothorax at the apex and medial base. Trace left pleural effusion. Upper abdomen: Cholecystectomy. Musculoskeletal: No acute or abnormal lytic or blastic osseous lesions. Moderate volume subcutaneous emphysema along the right lateral chest wall and flank. IMPRESSION: 1. Status post right upper and lower lobe wedge resections with rounded atelectasis/consolidation adjacent to the surgical chain sutures in the posterior right upper lobe and lateral right lower lobe at the sites of previously noted nodules. 2. Trace right pneumothorax at the apex and medial base. Right inferior anterolateral approach pleural catheter terminates along the posterior right apex. 3. Trace left pleural effusion. 4. Unchanged 9 mm medial left upper lobe ground-glass nodule. 5. Moderate volume subcutaneous emphysema along the right lateral chest wall and flank. 6. Aortic Atherosclerosis (ICD10-I70.0). Electronically Signed   By: Agustin Cree  M.D.   On: 10/26/2022 18:27    Assessment/Plan: S/P Procedure(s) (LRB): XI ROBOTIC ASSISTED THORACOSCOPY-RIGHT UPPER AND LOWER LOBE WEDGE RESECTIONS (Right) VIDEO BRONCHOSCOPY WITH ENDOBRONCHIAL NAVIGATION (N/A) LYMPH NODE BIOPSY (N/A) INTERCOSTAL NERVE BLOCK POD#3  1 afeb, VSS, nsr 2 O2 sats- had to be put back on 1 liter for sats in mid 80's 3 voiding , not measured 4 CXR- no pntx. Mild atx, left  hemidiaphragm elevation , but did have some preop so difficult to be sure 5 add lactulose for constipation 6 push pulm hygiene and rehab    5 no new labs  LOS: 3 days    Rowe Clack PA-C Pager [phone] 10/28/2022 Patient seen and examined, agree with above except it is right hemidiaphragm not left Emphasized IS Should be able to go home later today  Viviann Spare C. Dorris Fetch, MD [facility] [phone]

## 2022-10-28 NOTE — Progress Notes (Signed)
Mobility Specialist: Progress Note   10/28/22 1137  Mobility  Activity Ambulated with assistance in hallway  Level of Assistance Minimal assist, patient does 75% or more  Assistive Device Front wheel walker  Distance Ambulated (ft) 80 ft (40'x2)  Activity Response Tolerated fair  Mobility Referral Yes  $Mobility charge 1 Mobility  Mobility Specialist Start Time (ACUTE ONLY) 1059  Mobility Specialist Stop Time (ACUTE ONLY) 1135  Mobility Specialist Time Calculation (min) (ACUTE ONLY) 36 min   Pre-Mobility on RA: 80 HR, 88-91% SpO2 During Mobility:    On RA: 85-87% SpO2    On 2 L/min [place]: 94% Post-Mobility on 2 L/min [place]: 88 HR, 96% SpO2  Pt received in the bed and agreeable to mobility. MinA with bed mobility as well as to stand. Stopped x1 for standing break and x1 for seated break secondary to SOB and mild dizziness. Pt to the chair after session with call bell and phone in reach.   Diogenes Whirley Mobility Specialist Please contact via SecureChat or Rehab office at [phone]

## 2022-10-28 NOTE — Progress Notes (Signed)
Nurse requested Mobility Specialist to perform oxygen saturation test with pt which includes removing pt from oxygen both at rest and while ambulating.  Below are the results from that testing.     Patient Saturations on Room Air at Rest = spO2 91%  Patient Saturations on Room Air while Ambulating = sp02 85% .  Rested and performed pursed lip breathing for 1 minute with sp02 at 87%.  Patient Saturations on 2 Liters of oxygen while Ambulating = sp02 94%  At end of testing pt left in room on 2  Liters of oxygen.  Reported results to nurse.

## 2022-10-28 NOTE — Plan of Care (Signed)
  Problem: Activity: Goal: Risk for activity intolerance will decrease Outcome: Progressing   Problem: Pain Management: Goal: Pain level will decrease Outcome: Progressing   Problem: Skin Integrity: Goal: Wound healing without signs and symptoms infection will improve Outcome: Progressing   Problem: Education: Goal: Knowledge of General Education information will improve Description: Including pain rating scale, medication(s)/side effects and non-pharmacologic comfort measures Outcome: Progressing   Problem: Clinical Measurements: Goal: Ability to maintain clinical measurements within normal limits will improve Outcome: Progressing Goal: Will remain free from infection Outcome: Progressing   Problem: Nutrition: Goal: Adequate nutrition will be maintained Outcome: Progressing

## 2022-10-29 ENCOUNTER — Inpatient Hospital Stay (HOSPITAL_COMMUNITY): Payer: Medicare HMO

## 2022-10-29 LAB — TSH: TSH: 0.613 u[IU]/mL (ref 0.350–4.500)

## 2022-10-29 LAB — NA AND K (SODIUM & POTASSIUM), RAND UR
Potassium Urine: 18 mmol/L
Sodium, Ur: <10 mmol/L

## 2022-10-29 LAB — BASIC METABOLIC PANEL
Anion gap: 10 (ref 5–15)
BUN: 53 mg/dL — ABNORMAL HIGH (ref 8–23)
CO2: 21 mmol/L — ABNORMAL LOW (ref 22–32)
Calcium: 7.9 mg/dL — ABNORMAL LOW (ref 8.9–10.3)
Chloride: 92 mmol/L — ABNORMAL LOW (ref 98–111)
Creatinine, Ser: 3.66 mg/dL — ABNORMAL HIGH (ref 0.44–1.00)
GFR, Estimated: 12 mL/min — ABNORMAL LOW (ref >60)
Glucose, Bld: 104 mg/dL — ABNORMAL HIGH (ref 70–99)
Potassium: 4.6 mmol/L (ref 3.5–5.1)
Sodium: 123 mmol/L — ABNORMAL LOW (ref 135–145)

## 2022-10-29 LAB — URINALYSIS, ROUTINE W REFLEX MICROSCOPIC
Bilirubin Urine: NEGATIVE
Glucose, UA: >=500 mg/dL — AB
Hgb urine dipstick: NEGATIVE
Ketones, ur: NEGATIVE mg/dL
Leukocytes,Ua: NEGATIVE
Nitrite: NEGATIVE
Protein, ur: NEGATIVE mg/dL
Specific Gravity, Urine: 1.008 (ref 1.005–1.030)
pH: 5 (ref 5.0–8.0)

## 2022-10-29 LAB — GLUCOSE, CAPILLARY
Glucose-Capillary: 64 mg/dL — ABNORMAL LOW (ref 70–99)
Glucose-Capillary: 90 mg/dL (ref 70–99)
Glucose-Capillary: 109 mg/dL — ABNORMAL HIGH (ref 70–99)
Glucose-Capillary: 120 mg/dL — ABNORMAL HIGH (ref 70–99)
Glucose-Capillary: 106 mg/dL — ABNORMAL HIGH (ref 70–99)
Glucose-Capillary: 156 mg/dL — ABNORMAL HIGH (ref 70–99)

## 2022-10-29 LAB — CORTISOL: Cortisol, Plasma: 32.1 ug/dL

## 2022-10-29 LAB — OSMOLALITY, URINE: Osmolality, Ur: 274 mOsm/kg — ABNORMAL LOW (ref 300–900)

## 2022-10-29 MED ORDER — FLEET ENEMA 7-19 GM/118ML RE ENEM
1.0000 | ENEMA | Freq: Once | RECTAL | Status: AC
  Administered 2022-10-29: 1 via RECTAL
  Filled 2022-10-29: qty 1

## 2022-10-29 MED ORDER — SODIUM CHLORIDE 0.9 % IV SOLN: INTRAVENOUS | Status: AC

## 2022-10-29 MED ORDER — LACTULOSE 10 GM/15ML PO SOLN
20.0000 g | Freq: Once | ORAL | Status: AC
  Administered 2022-10-29: 20 g via ORAL
  Filled 2022-10-29: qty 30

## 2022-10-29 MED ORDER — FUROSEMIDE 10 MG/ML IJ SOLN
80.0000 mg | Freq: Once | INTRAMUSCULAR | Status: AC
  Administered 2022-10-29: 80 mg via INTRAVENOUS
  Filled 2022-10-29: qty 8

## 2022-10-29 MED ORDER — INSULIN DETEMIR 100 UNIT/ML ~~LOC~~ SOLN
25.0000 [IU] | Freq: Two times a day (BID) | SUBCUTANEOUS | Status: DC
  Administered 2022-10-29 (×2): 25 [IU] via SUBCUTANEOUS
  Filled 2022-10-29 (×4): qty 0.25

## 2022-10-29 MED ORDER — POLYETHYLENE GLYCOL 3350 17 G PO PACK
17.0000 g | PACK | Freq: Every day | ORAL | Status: DC
  Administered 2022-10-29: 17 g via ORAL
  Filled 2022-10-29 (×4): qty 1

## 2022-10-29 NOTE — Progress Notes (Signed)
CBG this AM was 64. Gave 2 cups orange juice. Recheck was 90.  After coughing badly, patient complains SOB. Lungs sound are diminished. O2 sat at 93%. Increased O2 to 3 liters  for comfort.  And assisted in sitting position. -felt better after intervention. Plan of care ongoing.

## 2022-10-29 NOTE — Progress Notes (Signed)
      [address]       [place] [address]             [phone]      4 Days Post-Op Procedure(s) (LRB): XI ROBOTIC ASSISTED THORACOSCOPY-RIGHT UPPER AND LOWER LOBE WEDGE RESECTIONS (Right) VIDEO BRONCHOSCOPY WITH ENDOBRONCHIAL NAVIGATION (N/A) LYMPH NODE BIOPSY (N/A) INTERCOSTAL NERVE BLOCK Subjective: Feeling better in general. Less nausea, small BM today.    Objective: Vital signs in last 24 hours: Temp:  [97.8 F (36.6 C)-98.7 F (37.1 C)] 97.8 F (36.6 C) (05/20 1048) Pulse Rate:  [72-94] 79 (05/20 1048) Cardiac Rhythm: Normal sinus rhythm (05/20 0701) Resp:  [15-20] 20 (05/20 1048) BP: (118-142)/(48-58) 134/48 (05/20 1048) SpO2:  [93 %-99 %] 98 % (05/20 1048)    Intake/Output from previous day: 05/19 0701 - 05/20 0700 In: 155 [P.O.:100; I.V.:55] Out: -  Intake/Output this shift: Total I/O In: 393.3 [P.O.:240; I.V.:153.3] Out: 200 [Urine:200]  General appearance: alert, cooperative, and mild distress Neurologic: intact Heart: RRR Lungs: breath sounds full and clear. CXR stable, no PTX.  Abdomen: soft, no tenderness Wound: the robotic port sites and CT exit site are dry.   Lab Results: Recent Labs    10/27/22 0011  WBC 11.2*  HGB 10.6*  HCT 34.6*  PLT 323   BMET:  Recent Labs    10/27/22 0011 10/29/22 0016  NA 132* 123*  K 4.3 4.6  CL 100 92*  CO2 20* 21*  GLUCOSE 213* 104*  BUN 32* 53*  CREATININE 2.24* 3.66*  CALCIUM 8.0* 7.9*    PT/INR: No results for input(s): "LABPROT", "INR" in the last 72 hours. ABG    Component Value Date/Time   PHART 7.326 (L) 10/25/2022 1033   HCO3 22.4 10/25/2022 1033   TCO2 24 10/25/2022 1033   ACIDBASEDEF 4.0 (H) 10/25/2022 1033   O2SAT 82 10/25/2022 1033   CBG (last 3)  Recent Labs    10/29/22 0646 10/29/22 0850 10/29/22 1058  GLUCAP 90 109* 120*   CLINICAL DATA:  [id number] S/P partial lobectomy of lung [id number]   EXAM: PORTABLE CHEST - 1 VIEW   COMPARISON:  the previous day's  study   FINDINGS: Persistent low lung volumes. Stable linear scarring or atelectasis at the left lung base. Staple lines at the right hilum.   Heart size and mediastinal contours are within normal limits.   No effusion.   Visualized bones unremarkable.  Right upper quadrant clips.   IMPRESSION: Low volumes. No acute findings.     Electronically Signed   By: Corlis Leak M.D.   On: 10/29/2022 10:36  Assessment/Plan: S/P Procedure(s) (LRB): XI ROBOTIC ASSISTED THORACOSCOPY-RIGHT UPPER AND LOWER LOBE WEDGE RESECTIONS (Right) VIDEO BRONCHOSCOPY WITH ENDOBRONCHIAL NAVIGATION (N/A) LYMPH NODE BIOPSY (N/A) INTERCOSTAL NERVE BLOCK  - POD 4. Progressing slowly with ambulation.  Stable respiratory status and CXR post CT removal. PATH pending.   -Hypoxia- desaturates with activity.   Arranged for home O2 therapy.  -Acute on chronic kidney disease- appreciate input from nephrology. RN reported UO prior to giving the Lasix at 3pm.  Has IVF at 114ml/hr.  Work up in progress.   -GI- only a small BM since surgery. She requests a laxative. Miralax ordered.   -ENDO- hypoglycemic this AM. Levemir decreased.   DVT PPX- on daily enoxaparin.    LOS: 4 days    Leary Roca, [place] [phone] 10/29/2022

## 2022-10-29 NOTE — TOC Progression Note (Signed)
Transition of Care [facility]) - Progression Note    Patient Details  Name: Audrey Green MRN: [id number] Date of Birth: [birth year]  [facility]) CM/SW Contact  Harriet Masson, RN Phone Number: 10/29/2022, 12:55 PM  Clinical Narrative:     [id number] Orders for home 02. Patient has no preference of DME agnecy.  Notified Jermaine with [facility] of home 02 order. Address, Phone number and PCP verified.  1256 discharge on hold due to no BM and creatine 3.0  TOC following.    Barriers to Discharge: Barriers Resolved  Expected Discharge Plan and Services         Expected Discharge Date: 10/28/22               DME Arranged: Oxygen DME Agency: [employer] Date DME Agency Contacted: 10/29/22 Time DME Agency Contacted: [phone] Representative spoke with at DME Agency: Vaughan Basta             Social Determinants of Health (SDOH) Interventions SDOH Screenings   Tobacco Use: Medium Risk (10/26/2022)    Readmission Risk Interventions    10/29/2022    9:36 AM  Readmission Risk Prevention Plan  Post Dischage Appt Complete  Medication Screening Complete  Transportation Screening Complete

## 2022-10-29 NOTE — Care Management Important Message (Signed)
Important Message  Patient Details  Name: Audrey Green MRN: [id number] Date of Birth: 11/03/1945   Medicare Important Message Given:  Yes     Dorena Bodo 10/29/2022, 2:21 PM

## 2022-10-29 NOTE — Progress Notes (Signed)
Mobility Specialist Progress Note:   10/29/22 1125  Mobility  Activity Ambulated with assistance in hallway  Level of Assistance Contact guard assist, steadying assist  Assistive Device Front wheel walker  Distance Ambulated (ft) 92 ft  Activity Response Tolerated well  Mobility Referral Yes  $Mobility charge 1 Mobility  Mobility Specialist Start Time (ACUTE ONLY) 1106  Mobility Specialist Stop Time (ACUTE ONLY) 1121  Mobility Specialist Time Calculation (min) (ACUTE ONLY) 15 min   Pt received in bed agreeable to ambulate. Pt ambulated on 3L/min, took 1x standing break to monitor sats and d/t mild SOB. No c/o throughout. Pt assisted to chair with call bell and phone at hand.   Pre Mobility Hr 77 SPO2 99%  During Mobility Hr 86 SPO2 96% Post Mobility Hr 87 SPO2 99%  Thompson Grayer Mobility Specialist  Please contact vis Secure Chat or  Rehab Office [phone]

## 2022-10-29 NOTE — Plan of Care (Signed)
  Problem: Cardiac: Goal: Will achieve and/or maintain hemodynamic stability Outcome: Progressing   Problem: Pain Management: Goal: Pain level will decrease Outcome: Progressing   Problem: Skin Integrity: Goal: Wound healing without signs and symptoms infection will improve Outcome: Progressing

## 2022-10-29 NOTE — Consult Note (Signed)
Reason for Consult: Renal failure Referring Physician:  Dr. Dorris Fetch  Chief Complaint:  Right lung nodules here for wedge resection  Assessment/Plan: Acute renal failure on CKD IIIb - creatinine was 2-2.1 at time of admission followed by [facility] Dr. Luther Redo. Will obtain records from [facility] to determine BL renal function but presumably the chronic component of renal failure from diabetes, htn, and atherosclerotic disease. I did not see any prolonged hypotensive episodes during surgery and patient was on Lisinopril, HCTZ, and Farxiga for the 1st 3 days but these likely only exacerbated whatever process was already occurring leading to worsening renal function. Patient may be in ATN and seems that prerenal azotemia is less likely but output has not been recorded well. Patient only received FLEETS on 5/20 which wouldn't explain the worsening trend in renal function. - Renal ultrasound to rule out obstruction. - Bladder scan for post void - Agree with holding the Lisinopril, HCTZ and Farxiga with acute renal failure. - No further FLEETS; will d/c. Only rx on 5/20.   -Maintain MAP>65 for optimal renal perfusion.  - Avoid nephrotoxic medications including NSAIDs and iodinated intravenous contrast exposure unless the latter is absolutely indicated.   - Preferred narcotic agents for pain control are hydromorphone, fentanyl, and methadone. Morphine should not be used.  - Avoid Baclofen and avoid oral sodium phosphate and magnesium citrate based laxatives / bowel preps.  - Need strict Input and Output monitoring. Will monitor the patient closely with you and intervene or adjust therapy as indicated by changes in clinical status/labs  Hyponatremia - appears to be hypervolemic variety and without daily labs will have to treat as chronic as Na decr from 132 -> 123 over a 48hr span. Wonder if it's secondary to mainly drinking but not consuming solids in the setting of renal failure + pain  (SIADH). Only nauseous starting today. - Will check urine studies, TSH, cortisol - Strict I&O's please. - Will give a dose of Lasix as well.  Right lung nodules s/p wedge resection HTN - ok for now (134/48) DM   HPI: Sofiana Eichorn is an 77 y.o. female bilateral lung nodules, quite tobacco 15 years ago, HTN, HLD, aortic atherosclerosis, DM, HCV, hepatic fibrosis. CKD4. Patient had a CT which showed progression of hte right upper lob nodule and was referred for resection. Patient then had a robotic assisted right VATS with right upper and lower lob wedge resection with LN biopsy on 5/16.   She denies chest pain, tightness and only shortness of breath with heavy exertion and denies cough, anorexia.   Cr has risen from 2.06 -> 3.66 with decrease in Na as well from 135 -> 123.   UOP not well recorded and I don't see any UOP for the past 3 days but according to the I&O's during this hospitalization she is +2.87L.  She was on Farxiga, Lisinopril and HCTZ for the 1st 3 days which was stopped on 5/20. Patient received a FLEETS enema on 5/20.   ROS Pertinent items are noted in HPI.  Chemistry and CBC: Creatinine  Date/Time Value Ref Range Status  08/06/2012 08:43 PM 1.36 (H) 0.60 - 1.30 mg/dL Final   Creat  Date/Time Value Ref Range Status  09/27/2011 01:37 PM 1.58 (H) 0.50 - 1.10 mg/dL Final   Creatinine, Ser  Date/Time Value Ref Range Status  10/29/2022 12:16 AM 3.66 (H) 0.44 - 1.00 mg/dL Final    Comment:    DELTA CHECK NOTED  10/27/2022 12:11 AM 2.24 (H) 0.44 -  1.00 mg/dL Final  16/03/9603 54:09 AM 2.00 (H) 0.44 - 1.00 mg/dL Final  81/19/1478 29:56 AM 2.06 (H) 0.44 - 1.00 mg/dL Final  21/30/8657 84:69 AM 1.92 (H) 0.50 - 1.10 mg/dL Final  62/95/2841 32:44 AM 1.4 (H) 0.4 - 1.2 mg/dL Final  06/13/7251 66:44 AM 1.3 (H) 0.4 - 1.2 mg/dL Final  03/47/4259 56:38 PM 1.2 0.4 - 1.2 mg/dL Final  75/64/3329 51:88 AM 1.3 (H) 0.4 - 1.2 mg/dL Final  41/66/0630 16:01 AM 1.1 0.4 - 1.2 mg/dL Final    Recent Labs  Lab 10/23/22 1157 10/25/22 1033 10/26/22 0014 10/27/22 0011 10/29/22 0016  NA 135 136 132* 132* 123*  K 4.7 4.3 4.5 4.3 4.6  CL 103  --  102 100 92*  CO2 20*  --  20* 20* 21*  GLUCOSE 106*  --  165* 213* 104*  BUN 25*  --  28* 32* 53*  CREATININE 2.06*  --  2.00* 2.24* 3.66*  CALCIUM 8.9  --  7.8* 8.0* 7.9*   Recent Labs  Lab 10/23/22 1157 10/25/22 1033 10/26/22 0014 10/27/22 0011  WBC 6.3  --  12.8* 11.2*  HGB 12.8 10.9* 10.5* 10.6*  HCT 41.1 32.0* 32.5* 34.6*  MCV 86.0  --  86.2 86.9  PLT 365  --  283 323   Liver Function Tests: Recent Labs  Lab 10/23/22 1157 10/27/22 0011  AST 23 26  ALT 22 25  ALKPHOS 64 45  BILITOT 0.4 0.5  PROT 7.2 6.2*  ALBUMIN 3.8 3.2*   No results for input(s): "LIPASE", "AMYLASE" in the last 168 hours. No results for input(s): "AMMONIA" in the last 168 hours. Cardiac Enzymes: No results for input(s): "CKTOTAL", "CKMB", "CKMBINDEX", "TROPONINI" in the last 168 hours. Iron Studies: No results for input(s): "IRON", "TIBC", "TRANSFERRIN", "FERRITIN" in the last 72 hours. PT/INR: @LABRCNTIP (inr:5)  Xrays/Other Studies: ) Results for orders placed or performed during the hospital encounter of 10/25/22 (from the past 48 hour(s))  Glucose, capillary     Status: Abnormal   Collection Time: 10/27/22 11:23 AM  Result Value Ref Range   Glucose-Capillary 110 (H) 70 - 99 mg/dL    Comment: Glucose reference range applies only to samples taken after fasting for at least 8 hours.  Glucose, capillary     Status: Abnormal   Collection Time: 10/27/22  4:44 PM  Result Value Ref Range   Glucose-Capillary 146 (H) 70 - 99 mg/dL    Comment: Glucose reference range applies only to samples taken after fasting for at least 8 hours.  Glucose, capillary     Status: Abnormal   Collection Time: 10/27/22  9:29 PM  Result Value Ref Range   Glucose-Capillary 103 (H) 70 - 99 mg/dL    Comment: Glucose reference range applies only to samples  taken after fasting for at least 8 hours.   Comment 1 Notify RN    Comment 2 Document in Chart   Glucose, capillary     Status: None   Collection Time: 10/28/22  6:42 AM  Result Value Ref Range   Glucose-Capillary 78 70 - 99 mg/dL    Comment: Glucose reference range applies only to samples taken after fasting for at least 8 hours.   Comment 1 Document in Chart   Glucose, capillary     Status: None   Collection Time: 10/28/22  8:27 AM  Result Value Ref Range   Glucose-Capillary 77 70 - 99 mg/dL    Comment: Glucose reference range applies only to samples  taken after fasting for at least 8 hours.  Glucose, capillary     Status: Abnormal   Collection Time: 10/28/22 11:00 AM  Result Value Ref Range   Glucose-Capillary 112 (H) 70 - 99 mg/dL    Comment: Glucose reference range applies only to samples taken after fasting for at least 8 hours.  Glucose, capillary     Status: None   Collection Time: 10/28/22  4:28 PM  Result Value Ref Range   Glucose-Capillary 98 70 - 99 mg/dL    Comment: Glucose reference range applies only to samples taken after fasting for at least 8 hours.  Glucose, capillary     Status: Abnormal   Collection Time: 10/28/22  9:03 PM  Result Value Ref Range   Glucose-Capillary 150 (H) 70 - 99 mg/dL    Comment: Glucose reference range applies only to samples taken after fasting for at least 8 hours.   Comment 1 Notify RN   Basic metabolic panel     Status: Abnormal   Collection Time: 10/29/22 12:16 AM  Result Value Ref Range   Sodium 123 (L) 135 - 145 mmol/L   Potassium 4.6 3.5 - 5.1 mmol/L   Chloride 92 (L) 98 - 111 mmol/L   CO2 21 (L) 22 - 32 mmol/L   Glucose, Bld 104 (H) 70 - 99 mg/dL    Comment: Glucose reference range applies only to samples taken after fasting for at least 8 hours.   BUN 53 (H) 8 - 23 mg/dL   Creatinine, Ser 1.19 (H) 0.44 - 1.00 mg/dL    Comment: DELTA CHECK NOTED   Calcium 7.9 (L) 8.9 - 10.3 mg/dL   GFR, Estimated 12 (L) >60 mL/min     Comment: (NOTE) Calculated using the CKD-EPI Creatinine Equation (2021)    Anion gap 10 5 - 15    Comment: Performed at [facility], [address], [place], [place] [address]  Glucose, capillary     Status: Abnormal   Collection Time: 10/29/22  6:12 AM  Result Value Ref Range   Glucose-Capillary 64 (L) 70 - 99 mg/dL    Comment: Glucose reference range applies only to samples taken after fasting for at least 8 hours.  Glucose, capillary     Status: None   Collection Time: 10/29/22  6:46 AM  Result Value Ref Range   Glucose-Capillary 90 70 - 99 mg/dL    Comment: Glucose reference range applies only to samples taken after fasting for at least 8 hours.   Comment 1 Notify RN   Glucose, capillary     Status: Abnormal   Collection Time: 10/29/22  8:50 AM  Result Value Ref Range   Glucose-Capillary 109 (H) 70 - 99 mg/dL    Comment: Glucose reference range applies only to samples taken after fasting for at least 8 hours.  Glucose, capillary     Status: Abnormal   Collection Time: 10/29/22 10:58 AM  Result Value Ref Range   Glucose-Capillary 120 (H) 70 - 99 mg/dL    Comment: Glucose reference range applies only to samples taken after fasting for at least 8 hours.   DG Chest Port 1 View  Result Date: 10/29/2022 CLINICAL DATA:  [id number] S/P partial lobectomy of lung [id number] EXAM: PORTABLE CHEST - 1 VIEW COMPARISON:  the previous day's study FINDINGS: Persistent low lung volumes. Stable linear scarring or atelectasis at the left lung base. Staple lines at the right hilum. Heart size and mediastinal contours are within normal limits. No  effusion. Visualized bones unremarkable.  Right upper quadrant clips. IMPRESSION: Low volumes. No acute findings. Electronically Signed   By: Corlis Leak M.D.   On: 10/29/2022 10:36   DG Chest 2 View  Result Date: 10/28/2022 CLINICAL DATA:  Partial right lung resection 5/16. Chest tube removal. EXAM: CHEST - 2 VIEW COMPARISON:  Yesterday FINDINGS: Suboptimal  lateral view secondary to patient positioning and overlying artifact. Wires and leads project over the chest on the frontal radiograph. Decreased right-sided subcutaneous emphysema. Moderate right hemidiaphragm elevation. Normal heart size. No pleural effusion or pneumothorax. Low lung volumes with resultant pulmonary interstitial prominence. Surgical sutures in the right infrahilar region. Minimal bibasilar subsegmental atelectasis. IMPRESSION: Low lung volumes with right hemithorax volume loss but no pneumothorax or other acute process. Decreased subcutaneous emphysema about the right chest wall. Electronically Signed   By: Jeronimo Greaves M.D.   On: 10/28/2022 10:47   DG Chest 1V REPEAT Same Day  Result Date: 10/27/2022 CLINICAL DATA:  Status post partial right lung resection, chest tube removal EXAM: CHEST - 1 VIEW SAME DAY COMPARISON:  10/27/2022 FINDINGS: Single frontal view of the chest demonstrates interval removal of the right-sided chest tube. There is subcutaneous gas within the right lateral chest wall. No evidence of pneumothorax. The cardiac silhouette is unremarkable. Postsurgical changes from partial right pneumonectomy. Stable elevation of the right hemidiaphragm. Left basilar subsegmental atelectasis. No effusion or pneumothorax. IMPRESSION: 1. No complication after right chest tube removal. No evidence of pneumothorax. 2. Left basilar subsegmental atelectasis. 3. Postsurgical changes from partial right lung resection. Electronically Signed   By: Sharlet Salina M.D.   On: 10/27/2022 18:07    PMH:   Past Medical History:  Diagnosis Date   A-fib (HCC)    Allergy    Aortic atherosclerosis (HCC)    Arthritis    Chronic kidney disease    stage 3   Complication of anesthesia    Diabetes mellitus    Dyspnea    with exertion   Fibroids    GERD (gastroesophageal reflux disease)    Goiter    Heart murmur    Hepatitis    from blood transfusion, was treated in 2014   History of blood  transfusion    History of colon polyps    Hyperlipidemia    Hypertension    Osteoporosis    Palpitations    Pancreatitis    PONV (postoperative nausea and vomiting)    years ago   Pulmonary emphysema (HCC)    Seasonal allergies     PSH:   Past Surgical History:  Procedure Laterality Date   ABDOMINAL HYSTERECTOMY  06/12/1975   CARPAL TUNNEL RELEASE Bilateral    CHOLECYSTECTOMY     COLONOSCOPY W/ POLYPECTOMY     INTERCOSTAL NERVE BLOCK  10/25/2022   Procedure: INTERCOSTAL NERVE BLOCK;  Surgeon: Loreli Slot, MD;  Location: [facility] OR;  Service: Thoracic;;   LUMBAR LAMINECTOMY/DECOMPRESSION MICRODISCECTOMY Left 06/24/2014   Procedure: Left Lumbar four-five microdiskectomy;  Surgeon: Coletta Memos, MD;  Location: [facility];  Service: Neurosurgery;  Laterality: Left;  Left Lumbar four-five microdiskectomy   LYMPH NODE BIOPSY N/A 10/25/2022   Procedure: LYMPH NODE BIOPSY;  Surgeon: Loreli Slot, MD;  Location: [facility] OR;  Service: Thoracic;  Laterality: N/A;   TONSILLECTOMY     VIDEO BRONCHOSCOPY WITH ENDOBRONCHIAL NAVIGATION N/A 10/25/2022   Procedure: VIDEO BRONCHOSCOPY WITH ENDOBRONCHIAL NAVIGATION;  Surgeon: Loreli Slot, MD;  Location: [facility] OR;  Service: Thoracic;  Laterality: N/A;  Allergies:  Allergies  Allergen Reactions   Atorvastatin     Other reaction(s): Muscle Pain   Codeine Hives   Penicillins Hives and Swelling    Tongue and throat swelling  Has patient had a PCN reaction causing immediate rash, facial/tongue/throat swelling, SOB or lightheadedness with hypotension: Yes Has patient had a PCN reaction causing severe rash involving mucus membranes or skin necrosis: Yes Has patient had a PCN reaction that required hospitalization Yes Has patient had a PCN reaction occurring within the last 10 years: No If all of the above answers are "NO", then may proceed with Cephalosporin use.   Sulfa Antibiotics Hives    Medications:   Prior to Admission  medications   Medication Sig Start Date End Date Taking? Authorizing Provider  aspirin EC 81 MG tablet Take 81 mg by mouth daily.   Yes [provider]  Cholecalciferol (VITAMIN D-1000 MAX ST) 1000 units tablet Take 1,000 Units by mouth daily.   Yes [provider]  diltiazem (CARDIZEM CD) 120 MG 24 hr capsule Take 120 mg by mouth daily.   Yes [provider]  esomeprazole (NEXIUM) 40 MG capsule TAKE 1 CAPSULE (40MG  TOTAL ) BY MOUTH 2 TIMES DAILY 08/14/12  Yes Dianne Dun, MD  FARXIGA 10 MG TABS tablet Take 10 mg by mouth daily.   Yes [provider]  fenofibrate (TRICOR) 145 MG tablet TAKE 1 TABLET (145 MG) BY ORAL ROUTE ONCE DAILY 12/28/17  Yes [provider]  hydrOXYzine (ATARAX/VISTARIL) 25 MG tablet Take 25 mg by mouth daily.   Yes [provider]  insulin aspart (NOVOLOG FLEXPEN) 100 UNIT/ML FlexPen Inject 10-15 Units into the skin 3 (three) times daily before meals.   Yes [provider]  isosorbide mononitrate (IMDUR) 30 MG 24 hr tablet Take 30 mg by mouth daily. 01/24/18  Yes [provider]  lisinopril-hydrochlorothiazide (PRINZIDE,[name]) 20-25 MG tablet Take 1 tablet by mouth daily. 12/02/17  Yes [provider]  Multiple Vitamin (MULITIVITAMIN WITH MINERALS) TABS Take 1 tablet by mouth daily. Centrum Silver   Yes [provider]  OZEMPIC, 1 MG/DOSE, 4 MG/3ML SOPN Inject 1 mg into the skin every Saturday.   Yes [provider]  pantoprazole (PROTONIX) 40 MG tablet Take 40 mg by mouth 2 (two) times daily. 11/05/20  Yes [provider]  pravastatin (PRAVACHOL) 20 MG tablet Take 20 mg by mouth at bedtime.   Yes [provider]  TRESIBA FLEXTOUCH 200 UNIT/ML SOPN Inject 44 Units into the skin at bedtime. 12/26/17  Yes [provider]  cetirizine (ZYRTEC) 10 MG tablet Take 10 mg by mouth at bedtime.     [provider]  Continuous Glucose Sensor (DEXCOM G7  SENSOR) MISC Use 1 each every 10 (ten) days to check blood sugars 6 times per day 06/22/22   [provider]  fluticasone (FLONASE) 50 MCG/ACT nasal spray Place 2 sprays into both nostrils daily as needed for allergies. 10/28/22   Gold, Wayne E, PA-C  glucose blood (ONE TOUCH ULTRA TEST) test strip Check blood sugar three times daily and as directed. Dx 250.4 10/28/22   Gold, Deniece Portela E, PA-C  Insulin Pen Needle (ULTICARE MINI PEN NEEDLES) 31G X 6 MM MISC Use to inject insulin daily 08/14/12   Dianne Dun, MD  meloxicam (MOBIC) 15 MG tablet Take 1 tablet (15 mg total) by mouth daily. Patient not taking: Reported on 10/19/2022 06/23/18   Felecia Shelling, DPM  ONE [facility] LANCETS  MISC Use to check blood sugar twice a day    [provider]  ranitidine (ZANTAC) 300 MG capsule Take 1 capsule (300 mg total) by mouth 2 (two) times daily. Patient not taking: Reported on 10/19/2022 08/22/12   Dianne Dun, MD  traMADol (ULTRAM) 50 MG tablet Take 1 tablet (50 mg total) by mouth every 6 (six) hours as needed for up to 7 days for severe pain. 10/28/22 11/04/22  Rowe Clack, PA-C    Discontinued Meds:   Medications Discontinued During This Encounter  Medication Reason   FLUAD 0.5 ML SUSY Patient Preference   Liraglutide (VICTOZA) 18 MG/3ML SOPN Patient Preference   Magnesium 200 MG TABS Patient Preference   indocyanine green (diluted 25mg /78mL SWI) with methylene blue 0.5% for surgical use Patient Discharge   bupivacaine liposome (EXPAREL 1.3%) 20 ml and bupivacaine (MARCAINE 0.5%) 30 ml with option for NS 50mL Patient Discharge   0.9 % irrigation (POUR BTL) Patient Discharge   lactated ringers infusion Patient Transfer   insulin aspart (novoLOG) injection 0-7 Units Patient Transfer   fentaNYL (SUBLIMAZE) injection 25-50 mcg Patient Transfer   loratadine (CLARITIN) tablet 10 mg Patient has not taken in last 30 days   pantoprazole (PROTONIX) EC tablet 40 mg    lisinopril-hydrochlorothiazide  ([name]) 20-25 MG per tablet 1 tablet    albuterol (PROVENTIL) (2.5 MG/3ML) 0.083% nebulizer solution 2.5 mg    insulin aspart (novoLOG) injection 0-24 Units    albuterol (PROVENTIL) (2.5 MG/3ML) 0.083% nebulizer solution 2.5 mg    enoxaparin (LOVENOX) injection 40 mg    Chlorhexidine Gluconate Cloth 2 % PADS 6 each    albuterol (PROVENTIL) (2.5 MG/3ML) 0.083% nebulizer solution 2.5 mg    insulin detemir (LEVEMIR) injection 20 Units    fluticasone (FLONASE) 50 MCG/ACT nasal spray Reorder   glucose blood (ONE TOUCH ULTRA TEST) test strip Reorder   dapagliflozin propanediol (FARXIGA) tablet 10 mg    fentaNYL (SUBLIMAZE) injection 25-50 mcg    lisinopril (ZESTRIL) tablet 20 mg    hydrochlorothiazide (HYDRODIURIL) tablet 25 mg    insulin detemir (LEVEMIR) injection 30 Units     Social History:  reports that she quit smoking about 14 years ago. Her smoking use included cigarettes. She has never used smokeless tobacco. She reports that she does not drink alcohol and does not use drugs.  Family History:   Family History  Problem Relation Age of Onset   Breast cancer Mother    Diabetes Mother    Heart disease Mother    Colon cancer Neg Hx     Blood pressure (!) 134/48, pulse 79, temperature 97.8 F (36.6 C), temperature source Oral, resp. rate 20, height 5\' 3"  (1.6 m), weight 65.8 kg, SpO2 98 %. General appearance: alert, cooperative, and appears stated age Head: Normocephalic, without obvious abnormality, atraumatic Throat: normal findings: palate normal Neck: no adenopathy, no carotid bruit, supple, symmetrical, trachea midline, and thyroid not enlarged, symmetric, no tenderness/mass/nodules Back: symmetric, no curvature. ROM normal. No CVA tenderness. Cardio: regular rate and rhythm GI: decreased BS Extremities: edema tr Pulses: 2+ and symmetric Skin: Skin color, texture, turgor normal. No rashes or lesions       Grete Bosko, Len Blalock, MD 10/29/2022, 11:22 AM

## 2022-10-30 ENCOUNTER — Inpatient Hospital Stay (HOSPITAL_COMMUNITY): Payer: Medicare HMO

## 2022-10-30 LAB — BASIC METABOLIC PANEL
Anion gap: 11 (ref 5–15)
BUN: 50 mg/dL — ABNORMAL HIGH (ref 8–23)
CO2: 21 mmol/L — ABNORMAL LOW (ref 22–32)
Calcium: 8.3 mg/dL — ABNORMAL LOW (ref 8.9–10.3)
Chloride: 97 mmol/L — ABNORMAL LOW (ref 98–111)
Creatinine, Ser: 2.8 mg/dL — ABNORMAL HIGH (ref 0.44–1.00)
GFR, Estimated: 17 mL/min — ABNORMAL LOW (ref >60)
Glucose, Bld: 123 mg/dL — ABNORMAL HIGH (ref 70–99)
Potassium: 4.1 mmol/L (ref 3.5–5.1)
Sodium: 129 mmol/L — ABNORMAL LOW (ref 135–145)

## 2022-10-30 LAB — GLUCOSE, CAPILLARY
Glucose-Capillary: 62 mg/dL — ABNORMAL LOW (ref 70–99)
Glucose-Capillary: 110 mg/dL — ABNORMAL HIGH (ref 70–99)
Glucose-Capillary: 62 mg/dL — ABNORMAL LOW (ref 70–99)
Glucose-Capillary: 111 mg/dL — ABNORMAL HIGH (ref 70–99)

## 2022-10-30 MED ORDER — INSULIN DETEMIR 100 UNIT/ML ~~LOC~~ SOLN
20.0000 [IU] | Freq: Two times a day (BID) | SUBCUTANEOUS | Status: DC
  Administered 2022-10-30 – 2022-11-01 (×3): 20 [IU] via SUBCUTANEOUS
  Filled 2022-10-30 (×6): qty 0.2

## 2022-10-30 NOTE — Progress Notes (Signed)
[facility] Progress Note   77 y.o. female bilateral lung nodules, quite tobacco 15 years ago, HTN, HLD, aortic atherosclerosis, DM, HCV, hepatic fibrosis. CKD4. Patient had a CT which showed progression of hte right upper lob nodule and was referred for resection. Patient then had a robotic assisted right VATS with right upper and lower lob wedge resection with LN biopsy on 5/16. Cr has risen from 2.06 -> 3.66 with decrease in Na as well from 135 -> 123. She was on Farxiga, Lisinopril and HCTZ for the 1st 3 days which was stopped on 5/20. Patient received a FLEETS enema on 5/20.   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 Luther Redo. Will obtain records from [facility] to determine BL renal function but presumably the chronic component of renal failure from diabetes, htn, and atherosclerotic disease. I did not see any prolonged hypotensive episodes during surgery and patient was on Lisinopril, HCTZ, and Farxiga for the 1st 3 days but these likely only exacerbated whatever process was already occurring leading to worsening renal function. Patient may be in ATN and seems that prerenal azotemia is less likely but output has not been recorded well. Patient only received FLEETS on 5/20 which wouldn't explain the worsening trend in renal function. - Renal ultrasound no e/o obstruction. - Agree with holding the Lisinopril, HCTZ and Farxiga with acute renal failure.  Will continue to follow; not going to give Lasix today. Would like to see what she does without the Lasix. Didn't seem to have a brisk response and also UNa was <10 and Uosm only 274.  - No further FLEETS; will d/c. Only rx on 5/20.    -Maintain MAP>65 for optimal renal perfusion.  - Avoid nephrotoxic medications including NSAIDs and iodinated intravenous contrast exposure unless the latter is absolutely indicated.   - Preferred narcotic agents  for pain control are hydromorphone, fentanyl, and methadone. Morphine should not be used.  - Avoid Baclofen and avoid oral sodium phosphate and magnesium citrate based laxatives / bowel preps.  - Need strict Input and Output monitoring. Will monitor the patient closely with you and intervene or adjust therapy as indicated by changes in clinical status/labs   Hyponatremia - appears to be hypervolemic variety and without daily labs will have to treat as chronic as Na decr from 132 -> 123 over a 48hr span. Wonder if it's secondary to mainly drinking but not consuming solids in the setting of renal failure + pain (SIADH). Only nauseous starting today. - Urine osm only 274; Na<10; NL TSH, cortisol  - Strict I&O's please. - Gave a dose of Lasix on 5/20 with improvement in SNa.but with UOsm of 274 I'm not convinced it was from the Lasix.   Right lung nodules s/p wedge resection HTN - ok for now (134/48) DM  Subjective:   Main complaint is pain in the right chest post surgery; slight blood tinge in sputum. Denies f/c/n/v.   Objective:   BP (!) 141/51 (BP Location: Right Arm)   Pulse 79   Temp 97.6 F (36.4 C) (Oral)   Resp 13   Ht 5\' 3"  (1.6 m)   Wt 65.8 kg   SpO2 93%   BMI 25.69 kg/m   Intake/Output Summary (Last 24 hours) at 10/30/2022 1044 Last data filed at 10/30/2022 0700 Gross per 24 hour  Intake 273.3 ml  Output 690 ml  Net -416.7 ml  Weight change:   Physical Exam: General appearance: NAD Head: NCAT Neck: no carotid bruit Back: No CVA tenderness. Cardio: RRR GI: decreased BS Extremities: edema tr Pulses: 2+ and symmetric Access: Lt BCF pulsatile  Imaging: DG Chest 2 View  Result Date: 10/30/2022 CLINICAL DATA:  Status post partial right lung resection. EXAM: CHEST - 2 VIEW COMPARISON:  10/29/2022 FINDINGS: Cardiomediastinal contours are stable. Postoperative changes identified within the right mid and right upper lung. No pneumothorax identified. Asymmetric right  lung volume loss is noted with elevation of the right hemidiaphragm. Left lower lung atelectasis is similar to the previous exam. IMPRESSION: 1. Status post partial right lung resection. No pneumothorax. 2. Left lower lung atelectasis. Electronically Signed   By: Signa Kell M.D.   On: 10/30/2022 08:20   US RENAL  Result Date: 10/29/2022 CLINICAL DATA:  Renal dysfunction EXAM: RENAL / URINARY TRACT ULTRASOUND COMPLETE COMPARISON:  07/27/2022 FINDINGS: Right Kidney: Renal measurements: 10.1 x 5.1 x 3.9 cm = volume: 106.3 mL. Echogenicity within normal limits. No mass or hydronephrosis visualized. Left Kidney: Renal measurements: 9.1 x 4.5 x 3.7 cm = volume: 77.6 mL. Technologist observed increased cortical echogenicity, possibly technical artifact. No mass or hydronephrosis visualized. Bladder: Appears normal for degree of bladder distention. Other: None. IMPRESSION: There is no hydronephrosis. Electronically Signed   By: Ernie Avena M.D.   On: 10/29/2022 17:17   DG Chest Port 1 View  Result Date: 10/29/2022 CLINICAL DATA:  [id number] S/P partial lobectomy of lung [phone] EXAM: PORTABLE CHEST - 1 VIEW COMPARISON:  the previous day's study FINDINGS: Persistent low lung volumes. Stable linear scarring or atelectasis at the left lung base. Staple lines at the right hilum. Heart size and mediastinal contours are within normal limits. No effusion. Visualized bones unremarkable.  Right upper quadrant clips. IMPRESSION: Low volumes. No acute findings. Electronically Signed   By: Corlis Leak M.D.   On: 10/29/2022 10:36    Labs: BMET Recent Labs  Lab 10/23/22 1157 10/25/22 1033 10/26/22 0014 10/27/22 0011 10/29/22 0016 10/30/22 0824  NA 135 136 132* 132* 123* 129*  K 4.7 4.3 4.5 4.3 4.6 4.1  CL 103  --  102 100 92* 97*  CO2 20*  --  20* 20* 21* 21*  GLUCOSE 106*  --  165* 213* 104* 123*  BUN 25*  --  28* 32* 53* 50*  CREATININE 2.06*  --  2.00* 2.24* 3.66* 2.80*  CALCIUM 8.9  --  7.8* 8.0* 7.9*  8.3*   CBC Recent Labs  Lab 10/23/22 1157 10/25/22 1033 10/26/22 0014 10/27/22 0011  WBC 6.3  --  12.8* 11.2*  HGB 12.8 10.9* 10.5* 10.6*  HCT 41.1 32.0* 32.5* 34.6*  MCV 86.0  --  86.2 86.9  PLT 365  --  283 323    Medications:     acetaminophen  1,000 mg Oral Q6H   Or   acetaminophen (TYLENOL) oral liquid 160 mg/5 mL  1,000 mg Oral Q6H   aspirin EC  81 mg Oral Daily   bisacodyl  10 mg Oral Daily   Chlorhexidine Gluconate Cloth  6 each Topical Daily   diltiazem  120 mg Oral Daily   enoxaparin (LOVENOX) injection  30 mg Subcutaneous Q24H   fenofibrate  160 mg Oral Daily   hydrOXYzine  25 mg Oral Daily   insulin aspart  0-15 Units Subcutaneous TID WC   insulin aspart  0-5 Units Subcutaneous QHS   insulin detemir  20 Units Subcutaneous BID  isosorbide mononitrate  30 mg Oral Daily   pantoprazole  40 mg Oral BID   polyethylene glycol  17 g Oral Daily   pravastatin  20 mg Oral QHS   senna-docusate  1 tablet Oral QHS      Paulene Floor, MD 10/30/2022, 10:44 AM

## 2022-10-30 NOTE — Progress Notes (Addendum)
      [address]       [place] [id number]             [phone]      5 Days Post-Op Procedure(s) (LRB): XI ROBOTIC ASSISTED THORACOSCOPY-RIGHT UPPER AND LOWER LOBE WEDGE RESECTIONS (Right) VIDEO BRONCHOSCOPY WITH ENDOBRONCHIAL NAVIGATION (N/A) LYMPH NODE BIOPSY (N/A) INTERCOSTAL NERVE BLOCK Subjective: Uncomfortable at the moment needing to get up to move her bowels. Had several small BM's overnight according to RN.  Also voided several times but unable to measure.  Ms. Fossen denies pain or nausea.  O2 at 2L/[place].    Objective: Vital signs in last 24 hours: Temp:  [97.7 F (36.5 C)-98.4 F (36.9 C)] 98 F (36.7 C) (05/21 0300) Pulse Rate:  [70-94] 70 (05/21 0600) Cardiac Rhythm: Normal sinus rhythm (05/20 1908) Resp:  [11-20] 13 (05/21 0600) BP: (105-162)/(48-92) 118/49 (05/21 0600) SpO2:  [97 %-100 %] 100 % (05/21 0600)    Intake/Output from previous day: 05/20 0701 - 05/21 0700 In: 393.3 [P.O.:240; I.V.:153.3] Out: 690 [Urine:645; Stool:45] Intake/Output this shift: No intake/output data recorded.  General appearance: alert, cooperative, and mild distress Neurologic: intact Heart: RRR Lungs: breath sounds full and clear. CXR stable, no PTX.  Abdomen: soft, no tenderness Wound: the robotic port sites and CT exit site are dry.   Lab Results: No results for input(s): "WBC", "HGB", "HCT", "PLT" in the last 72 hours.  BMET:  Recent Labs    10/29/22 0016  NA 123*  K 4.6  CL 92*  CO2 21*  GLUCOSE 104*  BUN 53*  CREATININE 3.66*  CALCIUM 7.9*     PT/INR: No results for input(s): "LABPROT", "INR" in the last 72 hours. ABG    Component Value Date/Time   PHART 7.326 (L) 10/25/2022 1033   HCO3 22.4 10/25/2022 1033   TCO2 24 10/25/2022 1033   ACIDBASEDEF 4.0 (H) 10/25/2022 1033   O2SAT 82 10/25/2022 1033   CBG (last 3)  Recent Labs    10/29/22 1553 10/29/22 2154 10/30/22 0635  GLUCAP 106* 156* 62*     Assessment/Plan: S/P  Procedure(s) (LRB): XI ROBOTIC ASSISTED THORACOSCOPY-RIGHT UPPER AND LOWER LOBE WEDGE RESECTIONS (Right) VIDEO BRONCHOSCOPY WITH ENDOBRONCHIAL NAVIGATION (N/A) LYMPH NODE BIOPSY (N/A) INTERCOSTAL NERVE BLOCK  - POD 5. Progressing slowly with ambulation.  O2 weaned from 3L >2L, stable sats. PATH pending.   -Hypoxia- improved.  Continue working on pulmonary hygiene. Arranged for home O2 therapy.  -Acute on chronic kidney disease- appreciate input from nephrology. Work up in progress.   -GI- Miralax given last evening, several small bm's since then. Abd benign.   -ENDO- glucose 62-120 past 24 hours. Levemir decreased to 20 units BID.   DVT PPX- on daily enoxaparin.    LOS: 5 days    Parke Poisson [phone] 10/30/2022  Patient seen and examined, agree with above Awaiting creatinine this AM Appreciate Nephrology assistance Wean O2  Salvatore Decent. Dorris Fetch, MD [facility] [phone]

## 2022-10-31 LAB — BASIC METABOLIC PANEL
Anion gap: 7 (ref 5–15)
BUN: 47 mg/dL — ABNORMAL HIGH (ref 8–23)
CO2: 25 mmol/L (ref 22–32)
Calcium: 8.4 mg/dL — ABNORMAL LOW (ref 8.9–10.3)
Chloride: 100 mmol/L (ref 98–111)
Creatinine, Ser: 2.54 mg/dL — ABNORMAL HIGH (ref 0.44–1.00)
GFR, Estimated: 19 mL/min — ABNORMAL LOW (ref >60)
Glucose, Bld: 103 mg/dL — ABNORMAL HIGH (ref 70–99)
Potassium: 4.1 mmol/L (ref 3.5–5.1)
Sodium: 132 mmol/L — ABNORMAL LOW (ref 135–145)

## 2022-10-31 LAB — GLUCOSE, CAPILLARY
Glucose-Capillary: 74 mg/dL (ref 70–99)
Glucose-Capillary: 89 mg/dL (ref 70–99)
Glucose-Capillary: 141 mg/dL — ABNORMAL HIGH (ref 70–99)
Glucose-Capillary: 63 mg/dL — ABNORMAL LOW (ref 70–99)
Glucose-Capillary: 86 mg/dL (ref 70–99)

## 2022-10-31 LAB — SURGICAL PATHOLOGY

## 2022-10-31 MED ORDER — LISINOPRIL 20 MG PO TABS
20.0000 mg | ORAL_TABLET | Freq: Every day | ORAL | Status: DC
  Administered 2022-10-31 – 2022-11-01 (×2): 20 mg via ORAL
  Filled 2022-10-31 (×2): qty 1

## 2022-10-31 NOTE — Progress Notes (Addendum)
      [address]       [place] [address]             [phone]      6 Days Post-Op Procedure(s) (LRB): XI ROBOTIC ASSISTED THORACOSCOPY-RIGHT UPPER AND LOWER LOBE WEDGE RESECTIONS (Right) VIDEO BRONCHOSCOPY WITH ENDOBRONCHIAL NAVIGATION (N/A) LYMPH NODE BIOPSY (N/A) INTERCOSTAL NERVE BLOCK Subjective: Feels better today, sitting up eating breakfast.  Said she didn't walk in the hall yesterday because there was not enough staff to help her.   Several stools past 24 hours, she said they are not loose and she has no abd discomfort or nausea.    O2 down to 1.5L/[place].    Objective: Vital signs in last 24 hours: Temp:  [97.6 F (36.4 C)-98.4 F (36.9 C)] 98.1 F (36.7 C) (05/22 0409) Pulse Rate:  [70-86] 70 (05/22 0409) Cardiac Rhythm: Normal sinus rhythm (05/21 1904) Resp:  [13-21] 20 (05/21 2200) BP: (104-141)/(42-101) 131/42 (05/22 0409) SpO2:  [92 %-96 %] 96 % (05/22 0409)    Intake/Output from previous day: 05/21 0701 - 05/22 0700 In: 460 [P.O.:460] Out: -  Intake/Output this shift: No intake/output data recorded.  General appearance: alert, cooperative, and no distress Neurologic: intact Heart: RRR Lungs: breath sounds full and clear.  Abdomen: soft, no tenderness Wound: the robotic port sites and CT exit site are dry.   Lab Results: No results for input(s): "WBC", "HGB", "HCT", "PLT" in the last 72 hours.  BMET:  Recent Labs    10/30/22 0824 10/31/22 0022  NA 129* 132*  K 4.1 4.1  CL 97* 100  CO2 21* 25  GLUCOSE 123* 103*  BUN 50* 47*  CREATININE 2.80* 2.54*  CALCIUM 8.3* 8.4*     PT/INR: No results for input(s): "LABPROT", "INR" in the last 72 hours. ABG    Component Value Date/Time   PHART 7.326 (L) 10/25/2022 1033   HCO3 22.4 10/25/2022 1033   TCO2 24 10/25/2022 1033   ACIDBASEDEF 4.0 (H) 10/25/2022 1033   O2SAT 82 10/25/2022 1033   CBG (last 3)  Recent Labs    10/30/22 1724 10/30/22 2111 10/31/22 0602  GLUCAP  62* 111* 74     Assessment/Plan: S/P Procedure(s) (LRB): XI ROBOTIC ASSISTED THORACOSCOPY-RIGHT UPPER AND LOWER LOBE WEDGE RESECTIONS (Right) VIDEO BRONCHOSCOPY WITH ENDOBRONCHIAL NAVIGATION (N/A) LYMPH NODE BIOPSY (N/A) INTERCOSTAL NERVE BLOCK  - POD 6. Did not walk in the hall yesterday.  O2 weaned from 2L >1.5L, stable sats. PATH pending.   -Hypoxia- improved.  Continue working on pulmonary hygiene. Arranged for home O2 therapy.  -Acute on chronic kidney disease- appreciate input from nephrology. Creat trending down past 2 days. UO not recorded.   -GI-  several small bm's past 24 hours. Abd benign.   -ENDO- glucose 62-150 past 24 hours. Continue Levemir and SSI  -DVT PPX- on daily enoxaparin.   -Disposition- making progress and ready for discharge soon if nephrology agrees. Need to assure she is independent with mobility and transfers. Will reassess later today.    LOS: 6 days    Parke Poisson [phone] 10/31/2022  Patient seen and examined, agree with above Looks better Plan as outlined above  Viviann Spare C. Dorris Fetch, MD [facility] [phone]

## 2022-10-31 NOTE — Progress Notes (Signed)
[facility] Progress Note   77 y.o. female bilateral lung nodules, quite tobacco 15 years ago, HTN, HLD, aortic atherosclerosis, DM, HCV, hepatic fibrosis. CKD4. Patient had a CT which showed progression of hte right upper lob nodule and was referred for resection. Patient then had a robotic assisted right VATS with right upper and lower lob wedge resection with LN biopsy on 5/16. Cr has risen from 2.06 -> 3.66 with decrease in Na as well from 135 -> 123. She was on Farxiga, Lisinopril and HCTZ for the 1st 3 days which was stopped on 5/20. Patient received a FLEETS enema on 5/20.   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 Luther Redo. Will obtain records from [facility] to determine BL renal function but presumably the chronic component of renal failure from diabetes, htn, and atherosclerotic disease. I did not see any prolonged hypotensive episodes during surgery and patient was on Lisinopril, HCTZ, and Farxiga for the 1st 3 days but these likely only exacerbated whatever process was already occurring leading to worsening renal function. Patient may be in ATN and seems that prerenal azotemia is less likely but output has not been recorded well. Patient only received FLEETS on 5/20 which wouldn't explain the worsening trend in renal function. UNa was <10 and Uosm only 274. FLEET on only 5/20 x1. - Renal ultrasound no e/o obstruction. - Agree with holding the Lisinopril, HCTZ and Farxiga with acute renal failure -> renal improving trend and not far off baseline. Will restart only the lisinopril for now and add on other meds as tolerated.  Will continue to follow; daily weights. Having a lot of diarrhea and she states a lot of urine also; therefore no Lasix today.   Will need f/u with [facility] in 2 weeks upon d/c to uptitrate antiHTN and possibly restart [place].  -Maintain MAP>65 for optimal renal  perfusion.  - Avoid nephrotoxic medications including NSAIDs and iodinated intravenous contrast exposure unless the latter is absolutely indicated.   - Preferred narcotic agents for pain control are hydromorphone, fentanyl, and methadone. Morphine should not be used.  - Avoid Baclofen and avoid oral sodium phosphate and magnesium citrate based laxatives / bowel preps.  - Need strict Input and Output monitoring. Will monitor the patient closely with you and intervene or adjust therapy as indicated by changes in clinical status/labs   Hyponatremia - appears to be hypervolemic variety and without daily labs will have to treat as chronic as Na decr from 132 -> 123 over a 48hr span. Wonder if it's secondary to mainly drinking but not consuming solids in the setting of renal failure + pain (SIADH).  Urine osm only 274; Na<10; NL TSH, cortisol - Improving trend. - Strict I&O's please. - Gave a dose of Lasix on 5/20 with improvement in SNa.but with UOsm of 274 I'm not convinced it was from the Lasix.   Right lung nodules s/p wedge resection HTN - ok for now (134/48) DM  Subjective:   Main complaint is pain in the right chest post surgery but that is improving; slight blood tinge in sputum. Denies f/c/n/v. A lot of diarrhea but reports lots of urine with the diarrhea.   Objective:   BP (!) 141/43 (BP Location: Right Arm)   Pulse 79   Temp 98.4 F (36.9 C) (Oral)   Resp 20   Ht 5\' 3"  (1.6 m)   Wt 65.8 kg  SpO2 98%   BMI 25.69 kg/m   Intake/Output Summary (Last 24 hours) at 10/31/2022 1110 Last data filed at 10/31/2022 6440 Gross per 24 hour  Intake 460 ml  Output --  Net 460 ml   Weight change:   Physical Exam: General appearance: NAD Head: NCAT Neck: no carotid bruit Back: No CVA tenderness. Cardio: RRR GI: decreased BS Extremities: edema tr Pulses: 2+ and symmetric Access: Lt BCF pulsatile  Imaging: DG Chest 2 View  Result Date: 10/30/2022 CLINICAL DATA:  Status post  partial right lung resection. EXAM: CHEST - 2 VIEW COMPARISON:  10/29/2022 FINDINGS: Cardiomediastinal contours are stable. Postoperative changes identified within the right mid and right upper lung. No pneumothorax identified. Asymmetric right lung volume loss is noted with elevation of the right hemidiaphragm. Left lower lung atelectasis is similar to the previous exam. IMPRESSION: 1. Status post partial right lung resection. No pneumothorax. 2. Left lower lung atelectasis. Electronically Signed   By: Signa Kell M.D.   On: 10/30/2022 08:20   US RENAL  Result Date: 10/29/2022 CLINICAL DATA:  Renal dysfunction EXAM: RENAL / URINARY TRACT ULTRASOUND COMPLETE COMPARISON:  07/27/2022 FINDINGS: Right Kidney: Renal measurements: 10.1 x 5.1 x 3.9 cm = volume: 106.3 mL. Echogenicity within normal limits. No mass or hydronephrosis visualized. Left Kidney: Renal measurements: 9.1 x 4.5 x 3.7 cm = volume: 77.6 mL. Technologist observed increased cortical echogenicity, possibly technical artifact. No mass or hydronephrosis visualized. Bladder: Appears normal for degree of bladder distention. Other: None. IMPRESSION: There is no hydronephrosis. Electronically Signed   By: Ernie Avena M.D.   On: 10/29/2022 17:17    Labs: BMET Recent Labs  Lab 10/25/22 1033 10/26/22 0014 10/27/22 0011 10/29/22 0016 10/30/22 0824 10/31/22 0022  NA 136 132* 132* 123* 129* 132*  K 4.3 4.5 4.3 4.6 4.1 4.1  CL  --  102 100 92* 97* 100  CO2  --  20* 20* 21* 21* 25  GLUCOSE  --  165* 213* 104* 123* 103*  BUN  --  28* 32* 53* 50* 47*  CREATININE  --  2.00* 2.24* 3.66* 2.80* 2.54*  CALCIUM  --  7.8* 8.0* 7.9* 8.3* 8.4*   CBC Recent Labs  Lab 10/25/22 1033 10/26/22 0014 10/27/22 0011  WBC  --  12.8* 11.2*  HGB 10.9* 10.5* 10.6*  HCT 32.0* 32.5* 34.6*  MCV  --  86.2 86.9  PLT  --  283 323    Medications:     aspirin EC  81 mg Oral Daily   bisacodyl  10 mg Oral Daily   Chlorhexidine Gluconate Cloth  6  each Topical Daily   diltiazem  120 mg Oral Daily   enoxaparin (LOVENOX) injection  30 mg Subcutaneous Q24H   fenofibrate  160 mg Oral Daily   hydrOXYzine  25 mg Oral Daily   insulin aspart  0-15 Units Subcutaneous TID WC   insulin aspart  0-5 Units Subcutaneous QHS   insulin detemir  20 Units Subcutaneous BID   isosorbide mononitrate  30 mg Oral Daily   pantoprazole  40 mg Oral BID   polyethylene glycol  17 g Oral Daily   pravastatin  20 mg Oral QHS   senna-docusate  1 tablet Oral QHS      Paulene Floor, MD 10/31/2022, 11:10 AM

## 2022-10-31 NOTE — Progress Notes (Signed)
2200 medications administered Per RN request (see MAR). Patient took meds whole with water without any issue. Levemir and Senna held per primary RN instruction. Patient report having a bowel movement this afternoon. SWAT RN

## 2022-10-31 NOTE — Progress Notes (Signed)
Hypoglycemic Event  CBG: 63   Treatment: 8 oz juice/soda  Symptoms: None  Follow-up CBG: Time:2135 CBG Result:86  Possible Reasons for Event: Inadequate meal intake and Medication regimen:    Comments/MD notified: Dr. Dorris Fetch New order from Dr. Dorris Fetch to hold the Levemir 20 units. Will continue to monitor.  Crisoforo Oxford

## 2022-10-31 NOTE — Progress Notes (Signed)
Mobility Specialist Progress Note:   10/31/22 1055  Mobility  Activity Ambulated with assistance in hallway  Level of Assistance Minimal assist, patient does 75% or more  Assistive Device Front wheel walker  Distance Ambulated (ft) 100 ft  Activity Response Tolerated well  Mobility Referral Yes  $Mobility charge 1 Mobility  Mobility Specialist Start Time (ACUTE ONLY) 1033  Mobility Specialist Stop Time (ACUTE ONLY) 1048  Mobility Specialist Time Calculation (min) (ACUTE ONLY) 15 min   Pt received in bed, eager to ambulate. Pt required SB assist for bed mobility and STS. Ambulated on 2L/min. During ambulation pt experienced LOB x1, requiring minA to correct, otherwise no c/o throughout. Pt assisted back in bed with call bell at hand.  Pre Mobility Hr 76 SPO2 100% During Mobility  Hr 100  Post Mobility Hr 82 SPO2 96%  Thompson Grayer Mobility Specialist  Please contact vis Secure Chat or  Rehab Office [phone]

## 2022-11-01 LAB — BASIC METABOLIC PANEL
Anion gap: 9 (ref 5–15)
BUN: 35 mg/dL — ABNORMAL HIGH (ref 8–23)
CO2: 23 mmol/L (ref 22–32)
Calcium: 8 mg/dL — ABNORMAL LOW (ref 8.9–10.3)
Chloride: 99 mmol/L (ref 98–111)
Creatinine, Ser: 2.08 mg/dL — ABNORMAL HIGH (ref 0.44–1.00)
GFR, Estimated: 24 mL/min — ABNORMAL LOW (ref >60)
Glucose, Bld: 107 mg/dL — ABNORMAL HIGH (ref 70–99)
Potassium: 4.4 mmol/L (ref 3.5–5.1)
Sodium: 131 mmol/L — ABNORMAL LOW (ref 135–145)

## 2022-11-01 LAB — GLUCOSE, CAPILLARY
Glucose-Capillary: 114 mg/dL — ABNORMAL HIGH (ref 70–99)
Glucose-Capillary: 70 mg/dL (ref 70–99)

## 2022-11-01 MED ORDER — INSULIN DETEMIR 100 UNIT/ML ~~LOC~~ SOLN
15.0000 [IU] | Freq: Every day | SUBCUTANEOUS | Status: DC
  Filled 2022-11-01: qty 0.15

## 2022-11-01 MED ORDER — DAPAGLIFLOZIN PROPANEDIOL 10 MG PO TABS
10.0000 mg | ORAL_TABLET | Freq: Every day | ORAL | Status: DC
  Administered 2022-11-01: 10 mg via ORAL
  Filled 2022-11-01: qty 1

## 2022-11-01 NOTE — Inpatient Diabetes Management (Signed)
Inpatient Diabetes Program Recommendations  AACE/ADA: New Consensus Statement on Inpatient Glycemic Control (2015)  Target Ranges:  Prepandial:   less than 140 mg/dL      Peak postprandial:   less than 180 mg/dL (1-2 hours)      Critically ill patients:  140 - 180 mg/dL   Lab Results  Component Value Date   GLUCAP 70 11/01/2022   HGBA1C 7.4 (H) 10/23/2022    Latest Reference Range & Units 10/31/22 06:02 10/31/22 11:11 10/31/22 16:13 10/31/22 21:02 10/31/22 21:35 11/01/22 03:30 11/01/22 06:19  Glucose-Capillary 70 - 99 mg/dL 74 89 409 (H) 63 (L) 86 114 (H) 70  (H): Data is abnormally high (L): Data is abnormally low  Diabetes history: DM2 Outpatient Diabetes medications: Lantus 15 units QD, Humalog 3-10 units TID Current orders for Inpatient glycemic control: Levemir 20 units BID, Novolog 0-15 units TID   Inpatient Diabetes Program Recommendations:   Noted hypoglycemia. Noted probable D/C home today. Please consider: -Change Levemir to home dose Semglee 15 units qd  Thank you, Audrey Green. Audrey Mceuen, RN, MSN, CDE  Diabetes Coordinator Inpatient Glycemic Control Team Team Pager [phone] (8am-5pm) 11/01/2022 9:38 AM

## 2022-11-01 NOTE — Progress Notes (Signed)
Mobility Specialist: Progress Note   11/01/22 1058  Mobility  Activity Ambulated with assistance in hallway  Level of Assistance Minimal assist, patient does 75% or more  Assistive Device Front wheel walker  Distance Ambulated (ft) 100 ft  Activity Response Tolerated well  Mobility Referral Yes  $Mobility charge 1 Mobility  Mobility Specialist Start Time (ACUTE ONLY) 1032  Mobility Specialist Stop Time (ACUTE ONLY) 1058  Mobility Specialist Time Calculation (min) (ACUTE ONLY) 26 min   Pre-Mobility: 72 HR, 99% SpO2  Pt received in the bed and agreeable to mobility. Ambulated on 2 L/min [place]. Mod I with bed mobility and minA to stand. No c/o throughout. Pt assisted to the [facility] and then chair at end of session with call bell and phone in reach.   Kadince Boxley Mobility Specialist Please contact via SecureChat or Rehab office at [phone]

## 2022-11-01 NOTE — Discharge Instructions (Signed)

## 2022-11-01 NOTE — Progress Notes (Signed)
[facility] Progress Note   77 y.o. female bilateral lung nodules, quite tobacco 15 years ago, HTN, HLD, aortic atherosclerosis, DM, HCV, hepatic fibrosis. CKD4. Patient had a CT which showed progression of hte right upper lob nodule and was referred for resection. Patient then had a robotic assisted right VATS with right upper and lower lob wedge resection with LN biopsy on 5/16. Cr has risen from 2.06 -> 3.66 with decrease in Na as well from 135 -> 123. She was on Farxiga, Lisinopril and HCTZ for the 1st 3 days which was stopped on 5/20. Patient received a FLEETS enema on 5/20.   Assessment/ Plan:   Acute renal failure on CKD IIIb - creatinine was 2-2.1 at time of admission followed by [facility] Dr. Luther Redo. Will obtain records from [facility] to determine BL renal function but presumably the chronic component of renal failure from diabetes, htn, and atherosclerotic disease. I did not see any prolonged hypotensive episodes during surgery and patient was on Lisinopril, HCTZ, and Farxiga for the 1st 3 days but these likely only exacerbated whatever process was already occurring leading to worsening renal function. Patient may be in ATN and seems that prerenal azotemia is less likely but output has not been recorded well. Patient only received FLEETS on 5/20 which wouldn't explain the worsening trend in renal function. UNa was <10 and Uosm only 274. FLEET on only 5/20 x1. - Renal ultrasound no e/o obstruction. - Agree with holding the Lisinopril, HCTZ and Farxiga with acute renal failure -> renal improving trend and now at baseline even after restarting the lisinopril on 5/22. Will restart the [place]. @ time of d/c stop the Lisinopril as she's on the combo pill lisinopril + HCTZ (zestoretic) @ home. I explained this to her as well.  Will continue to follow; daily weights. She's feeling much better for the 1st time.  Will need f/u with [facility] in 2-4 weeks upon d/c.  -Maintain  MAP>65 for optimal renal perfusion.  - Avoid nephrotoxic medications including NSAIDs and iodinated intravenous contrast exposure unless the latter is absolutely indicated.   - Preferred narcotic agents for pain control are hydromorphone, fentanyl, and methadone. Morphine should not be used.  - Avoid Baclofen and avoid oral sodium phosphate and magnesium citrate based laxatives / bowel preps.  - Need strict Input and Output monitoring. Will monitor the patient closely with you and intervene or adjust therapy as indicated by changes in clinical status/labs   Hyponatremia - appears to be hypervolemic variety and without daily labs will have to treat as chronic as Na decr from 132 -> 123 over a 48hr span. Wonder if it's secondary to mainly drinking but not consuming solids in the setting of renal failure + pain (SIADH).  Urine osm only 274; Na<10; NL TSH, cortisol - Improving trend. - Strict I&O's please. - Gave a dose of Lasix on 5/20 with improvement in SNa.but with UOsm of 274 I'm not convinced it was from the Lasix.   Right lung nodules s/p wedge resection HTN - ok for now (134/48) DM  Subjective:   Main complaint is pain in the right chest post surgery but that much improved today.     Objective:   BP (!) 104/59 (BP Location: Right Arm)   Pulse 69   Temp 98.4 F (36.9 C) (Oral)   Resp (!) 21   Ht 5\' 3"  (1.6 m)   Wt 65.8 kg   SpO2 96%   BMI 25.69 kg/m  Intake/Output Summary (Last 24 hours) at 11/01/2022 0836 Last data filed at 11/01/2022 0754 Gross per 24 hour  Intake 600 ml  Output --  Net 600 ml   Weight change:   Physical Exam: General appearance: NAD Head: NCAT Neck: no carotid bruit Back: No CVA tenderness. Cardio: RRR GI: decreased BS Extremities: edema tr Pulses: 2+ and symmetric Access: Lt BCF pulsatile  Imaging: No results found.  Labs: BMET Recent Labs  Lab 10/25/22 1033 10/26/22 0014 10/27/22 0011 10/29/22 0016 10/30/22 0824 10/31/22 0022  11/01/22 0017  NA 136 132* 132* 123* 129* 132* 131*  K 4.3 4.5 4.3 4.6 4.1 4.1 4.4  CL  --  102 100 92* 97* 100 99  CO2  --  20* 20* 21* 21* 25 23  GLUCOSE  --  165* 213* 104* 123* 103* 107*  BUN  --  28* 32* 53* 50* 47* 35*  CREATININE  --  2.00* 2.24* 3.66* 2.80* 2.54* 2.08*  CALCIUM  --  7.8* 8.0* 7.9* 8.3* 8.4* 8.0*   CBC Recent Labs  Lab 10/25/22 1033 10/26/22 0014 10/27/22 0011  WBC  --  12.8* 11.2*  HGB 10.9* 10.5* 10.6*  HCT 32.0* 32.5* 34.6*  MCV  --  86.2 86.9  PLT  --  283 323    Medications:     aspirin EC  81 mg Oral Daily   bisacodyl  10 mg Oral Daily   diltiazem  120 mg Oral Daily   enoxaparin (LOVENOX) injection  30 mg Subcutaneous Q24H   fenofibrate  160 mg Oral Daily   hydrOXYzine  25 mg Oral Daily   insulin aspart  0-15 Units Subcutaneous TID WC   insulin aspart  0-5 Units Subcutaneous QHS   insulin detemir  20 Units Subcutaneous BID   isosorbide mononitrate  30 mg Oral Daily   lisinopril  20 mg Oral Daily   pantoprazole  40 mg Oral BID   polyethylene glycol  17 g Oral Daily   pravastatin  20 mg Oral QHS   senna-docusate  1 tablet Oral QHS      Paulene Floor, MD 11/01/2022, 8:36 AM

## 2022-11-01 NOTE — Progress Notes (Signed)
Went over discharge paperwork with patient and sister. All questions answered. PIV and telemetry removed. All belongings at bedside.

## 2022-11-01 NOTE — Progress Notes (Addendum)
      [address]       [place] [address]             [phone]      7 Days Post-Op Procedure(s) (LRB): XI ROBOTIC ASSISTED THORACOSCOPY-RIGHT UPPER AND LOWER LOBE WEDGE RESECTIONS (Right) VIDEO BRONCHOSCOPY WITH ENDOBRONCHIAL NAVIGATION (N/A) LYMPH NODE BIOPSY (N/A) INTERCOSTAL NERVE BLOCK Subjective: Had a good day yesterday, walked in the hall with minimal assistance.    O2 down to 1.5L/[place].    Objective: Vital signs in last 24 hours: Temp:  [98.3 F (36.8 C)-99.9 F (37.7 C)] 98.4 F (36.9 C) (05/23 0323) Pulse Rate:  [67-73] 68 (05/23 0100) Cardiac Rhythm: Normal sinus rhythm (05/22 1936) Resp:  [16-19] 18 (05/23 0100) BP: (109-137)/(42-54) 122/42 (05/22 2252) SpO2:  [95 %-100 %] 95 % (05/23 0100)    Intake/Output from previous day: 05/22 0701 - 05/23 0700 In: 240 [P.O.:240] Out: -  Intake/Output this shift: No intake/output data recorded.  General appearance: alert, cooperative, and no distress Neurologic: intact Heart: RRR Lungs: breath sounds full and clear.  Abdomen: soft, no tenderness Wound: the robotic port sites and CT exit site are dry.   Lab Results: No results for input(s): "WBC", "HGB", "HCT", "PLT" in the last 72 hours.  BMET:  Recent Labs    10/31/22 0022 11/01/22 0017  NA 132* 131*  K 4.1 4.4  CL 100 99  CO2 25 23  GLUCOSE 103* 107*  BUN 47* 35*  CREATININE 2.54* 2.08*  CALCIUM 8.4* 8.0*     PT/INR: No results for input(s): "LABPROT", "INR" in the last 72 hours. ABG    Component Value Date/Time   PHART 7.326 (L) 10/25/2022 1033   HCO3 22.4 10/25/2022 1033   TCO2 24 10/25/2022 1033   ACIDBASEDEF 4.0 (H) 10/25/2022 1033   O2SAT 82 10/25/2022 1033   CBG (last 3)  Recent Labs    10/31/22 2135 11/01/22 0330 11/01/22 0619  GLUCAP 86 114* 70     Assessment/Plan: S/P Procedure(s) (LRB): XI ROBOTIC ASSISTED THORACOSCOPY-RIGHT UPPER AND LOWER LOBE WEDGE RESECTIONS (Right) VIDEO BRONCHOSCOPY WITH  ENDOBRONCHIAL NAVIGATION (N/A) LYMPH NODE BIOPSY (N/A) INTERCOSTAL NERVE BLOCK  - POD 7. Did well with ambulation yesterday and is independent with transfers.  O2 at 2L , stable sats. 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   -Hypoxia- improved.  Continue working on pulmonary hygiene. Arranged for home O2 therapy.  -Acute on chronic kidney disease- appreciate input from nephrology. Creat down to pre-op baseline. Na+ 131.  UO not recorded. Lisinopril resumed yesterday by nephrology.   -GI-  several small bm's past 24 hours. Abd remains benign. Stop bowel regimen.   -ENDO- glucose 63-140 past 24 hours. Will resume her usual regimen at discharge  -DVT PPX- on daily enoxaparin.   -Disposition- Plan discharge today.  She will be staying with her 2 sisters who are able to assist her.    LOS: 7 days    Audrey Green, [place] [phone] 11/01/2022  Patient seen and examined, agree with above Renal function back to baseline Had synchronous stage IA adenocarcinomas, margins clear  Audrey Spare C. Dorris Fetch, MD [facility] [phone]

## 2022-11-07 ENCOUNTER — Other Ambulatory Visit: Payer: Self-pay | Admitting: Thoracic Surgery (Cardiothoracic Vascular Surgery)

## 2022-11-07 DIAGNOSIS — R918 Other nonspecific abnormal finding of lung field: Secondary | ICD-10-CM

## 2022-11-08 ENCOUNTER — Encounter: Payer: Self-pay | Admitting: Thoracic Surgery (Cardiothoracic Vascular Surgery)

## 2022-11-08 ENCOUNTER — Ambulatory Visit
Admission: RE | Admit: 2022-11-08 | Discharge: 2022-11-08 | Disposition: A | Discharge status: Home / Self Care | Payer: Medicare HMO | Source: Ambulatory Visit | Attending: Thoracic Surgery (Cardiothoracic Vascular Surgery) | Admitting: Thoracic Surgery (Cardiothoracic Vascular Surgery)

## 2022-11-08 ENCOUNTER — Ambulatory Visit (INDEPENDENT_AMBULATORY_CARE_PROVIDER_SITE_OTHER): Payer: Self-pay | Admitting: Thoracic Surgery (Cardiothoracic Vascular Surgery)

## 2022-11-08 VITALS — BP 145/68 | HR 67 | Resp 20 | Ht 63.0 in | Wt 142.2 lb

## 2022-11-08 DIAGNOSIS — R918 Other nonspecific abnormal finding of lung field: Secondary | ICD-10-CM

## 2022-11-08 NOTE — Progress Notes (Signed)
Request for molecular and PD-L1 studies emailed to Lita Mains, pathology technician.

## 2022-11-08 NOTE — Progress Notes (Signed)
[address]       [place] [address]             [phone]     HPI: [name] returns for scheduled follow-up after right upper lobe and lower lobe wedge resections.  Audrey Green is a 77 year old woman with a history of bilateral lung nodules, remote tobacco use, hypertension, hyperlipidemia, aortic atherosclerosis, heart murmur, insulin-dependent diabetes, stage IV chronic kidney disease, hepatitis C, hepatic fibrosis, and osteoporosis.  She has been followed for years with bilateral groundglass opacities in her lung.  She previously been followed by Audrey Green and then Audrey Green.  I saw her recently and there was interval enlargement of a right upper lobe lung nodule.  There was a right lower lobe nodule that was not particularly change.  There were additional groundglass nodules bilaterally that were also unchanged.  I did a robotic assisted wedge resection of right upper and lower lobe nodules on 10/25/2022.  Intraoperative frozen sections on both specimens failed to reveal cancer so we did additional stapling from the same area.  On final pathology both nodules were adenocarcinomas and were in the original wedge resections.  Postoperatively she had issues with gas pains and constipation.  She developed acute renal insufficiency, better renal function returned to baseline prior to discharge.  Since discharge she says she was having a rough time initially but now is feeling better.  This was mostly related to constipation and gas pains.  She has some incisional pain but is only occasionally taking tramadol.  No respiratory issues.  Past Medical History:  Diagnosis Date   A-fib [facility])    Allergy    Aortic atherosclerosis (HCC)    Arthritis    Chronic kidney disease    stage 3   Complication of anesthesia    Diabetes mellitus    Dyspnea    with exertion   Fibroids    GERD (gastroesophageal reflux disease)    Goiter    Heart murmur    Hepatitis    from  blood transfusion, was treated in 2014   History of blood transfusion    History of colon polyps    Hyperlipidemia    Hypertension    Osteoporosis    Palpitations    Pancreatitis    PONV (postoperative nausea and vomiting)    years ago   Pulmonary emphysema (HCC)    Seasonal allergies     Current Outpatient Medications  Medication Sig Dispense Refill   aspirin EC 81 MG tablet Take 81 mg by mouth daily.     cetirizine (ZYRTEC) 10 MG tablet Take 10 mg by mouth at bedtime.      Cholecalciferol (VITAMIN D-1000 MAX ST) 1000 units tablet Take 1,000 Units by mouth daily.     Continuous Glucose Sensor (DEXCOM G7 SENSOR) MISC Use 1 each every 10 (ten) days to check blood sugars 6 times per day     diltiazem (CARDIZEM CD) 120 MG 24 hr capsule Take 120 mg by mouth daily.     esomeprazole (NEXIUM) 40 MG capsule TAKE 1 CAPSULE (40MG  TOTAL ) BY MOUTH 2 TIMES DAILY 180 capsule 1   FARXIGA 10 MG TABS tablet Take 10 mg by mouth daily.     fenofibrate (TRICOR) 145 MG tablet TAKE 1 TABLET (145 MG) BY ORAL ROUTE ONCE DAILY  4   fluticasone (FLONASE) 50 MCG/ACT nasal spray Place 2 sprays into both nostrils daily as needed for allergies.  glucose blood (ONE TOUCH ULTRA TEST) test strip Check blood sugar three times daily and as directed. Dx 250.4 100 each 5   hydrOXYzine (ATARAX/VISTARIL) 25 MG tablet Take 25 mg by mouth daily.     insulin aspart (NOVOLOG FLEXPEN) 100 UNIT/ML FlexPen Inject 10-15 Units into the skin 3 (three) times daily before meals.     Insulin Pen Needle (ULTICARE MINI PEN NEEDLES) 31G X 6 MM MISC Use to inject insulin daily 200 each 1   isosorbide mononitrate (IMDUR) 30 MG 24 hr tablet Take 30 mg by mouth daily.  3   lisinopril-hydrochlorothiazide (PRINZIDE,[name]) 20-25 MG tablet Take 1 tablet by mouth daily.  3   Multiple Vitamin (MULITIVITAMIN WITH MINERALS) TABS Take 1 tablet by mouth daily. Centrum Silver     ONE TOUCH LANCETS MISC Use to check blood sugar twice a day      OZEMPIC, 1 MG/DOSE, 4 MG/3ML SOPN Inject 1 mg into the skin every Saturday.     pantoprazole (PROTONIX) 40 MG tablet Take 40 mg by mouth 2 (two) times daily.     pravastatin (PRAVACHOL) 20 MG tablet Take 20 mg by mouth at bedtime.     TRESIBA FLEXTOUCH 200 UNIT/ML SOPN Inject 44 Units into the skin at bedtime.  3   No current facility-administered medications for this visit.    Physical Exam BP (!) 145/68 (BP Location: Right Arm, Patient Position: Sitting, Cuff Size: Normal)   Pulse 67   Resp 20   Ht 5\' 3"  (1.6 m)   Wt 142 lb 3.2 oz (64.5 kg)   SpO2 94% Comment: RA  BMI 25.6 kg/m  77 year old woman in no acute distress Alert and oriented x 3 with no focal deficits Lungs diminished at right base otherwise clear Incisions clean dry and intact  Diagnostic Tests: CHEST - 2 VIEW   COMPARISON:  10/30/2022   FINDINGS: The heart size and mediastinal contours are within normal limits. Moderate right pleural effusion with associated atelectasis or consolidation and elevation of the right hemidiaphragm. The visualized skeletal structures are unremarkable.   IMPRESSION: Moderate right pleural effusion with associated atelectasis or consolidation and elevation of the right hemidiaphragm. Lungs are otherwise normally aerated.     Electronically Signed   By: Audrey Green M.D.   On: 11/08/2022 15:29 I personally reviewed the chest x-ray images.  Impression: Audrey Green is a 77 year old woman with a history of bilateral lung nodules, remote tobacco use, hypertension, hyperlipidemia, aortic atherosclerosis, heart murmur, insulin-dependent diabetes, stage IV chronic kidney disease, hepatitis C, hepatic fibrosis, and osteoporosis.  She had longstanding lung nodules that been followed for years until her recent change.  She underwent wedge resections of right upper and lower lobe nodules.  Both turned out to be adenocarcinomas.  Surgical standpoint she is doing well.  She does have some  discomfort but that is improving.  Her primary issues related to GI complaints with bloating and gas pains and constipation.  That has improved significantly.  There are no restrictions on her activities at this point.  She should build into new activities gradually.  Given that she had tumors in 2 separate lobes and also has a suspicious lesion on the left, the consensus with our multidisciplinary conference was to do molecular testing.   Plan: Refer to Dr. Arbutus Ped Return in 6 weeks with PA and lateral chest x-ray to check on progress  Loreli Slot, MD [facility] [phone]

## 2022-11-09 ENCOUNTER — Telehealth: Payer: Self-pay

## 2022-11-09 NOTE — Telephone Encounter (Signed)
Patient called requesting pick-up of her oxygen supplies. She was seen in the office yesterday by Dr. Dorris Fetch. She states she no longer uses the equipment and would like it picked up. Faxed letter to [facility] for pickup of oxygen and supplies. Patient aware.

## 2022-11-19 ENCOUNTER — Encounter (HOSPITAL_COMMUNITY): Payer: Self-pay

## 2022-11-23 ENCOUNTER — Other Ambulatory Visit: Payer: Self-pay

## 2022-11-23 DIAGNOSIS — C801 Malignant (primary) neoplasm, unspecified: Secondary | ICD-10-CM

## 2022-11-26 ENCOUNTER — Inpatient Hospital Stay: Payer: Medicare HMO | Attending: Internal Medicine | Admitting: Internal Medicine

## 2022-11-26 ENCOUNTER — Inpatient Hospital Stay: Payer: Medicare HMO

## 2022-11-26 VITALS — BP 136/62 | HR 84 | Temp 97.5°F | Resp 18 | Ht 63.0 in | Wt 135.8 lb

## 2022-11-26 DIAGNOSIS — Z87891 Personal history of nicotine dependence: Secondary | ICD-10-CM | POA: Diagnosis not present

## 2022-11-26 DIAGNOSIS — C3431 Malignant neoplasm of lower lobe, right bronchus or lung: Secondary | ICD-10-CM | POA: Insufficient documentation

## 2022-11-26 DIAGNOSIS — I129 Hypertensive chronic kidney disease with stage 1 through stage 4 chronic kidney disease, or unspecified chronic kidney disease: Secondary | ICD-10-CM | POA: Diagnosis not present

## 2022-11-26 DIAGNOSIS — I4891 Unspecified atrial fibrillation: Secondary | ICD-10-CM

## 2022-11-26 DIAGNOSIS — C801 Malignant (primary) neoplasm, unspecified: Secondary | ICD-10-CM

## 2022-11-26 DIAGNOSIS — C3491 Malignant neoplasm of unspecified part of right bronchus or lung: Secondary | ICD-10-CM

## 2022-11-26 DIAGNOSIS — N189 Chronic kidney disease, unspecified: Secondary | ICD-10-CM

## 2022-11-26 DIAGNOSIS — Z803 Family history of malignant neoplasm of breast: Secondary | ICD-10-CM

## 2022-11-26 DIAGNOSIS — C3411 Malignant neoplasm of upper lobe, right bronchus or lung: Secondary | ICD-10-CM | POA: Diagnosis not present

## 2022-11-26 DIAGNOSIS — E1122 Type 2 diabetes mellitus with diabetic chronic kidney disease: Secondary | ICD-10-CM

## 2022-11-26 DIAGNOSIS — C3490 Malignant neoplasm of unspecified part of unspecified bronchus or lung: Secondary | ICD-10-CM

## 2022-11-26 LAB — COMPREHENSIVE METABOLIC PANEL
ALT: 12 U/L (ref 0–44)
AST: 14 U/L — ABNORMAL LOW (ref 15–41)
Albumin: 3.9 g/dL (ref 3.5–5.0)
Alkaline Phosphatase: 63 U/L (ref 38–126)
Anion gap: 8 (ref 5–15)
BUN: 25 mg/dL — ABNORMAL HIGH (ref 8–23)
CO2: 23 mmol/L (ref 22–32)
Calcium: 9.1 mg/dL (ref 8.9–10.3)
Chloride: 105 mmol/L (ref 98–111)
Creatinine, Ser: 2.53 mg/dL — ABNORMAL HIGH (ref 0.44–1.00)
GFR, Estimated: 19 mL/min — ABNORMAL LOW (ref >60)
Glucose, Bld: 197 mg/dL — ABNORMAL HIGH (ref 70–99)
Potassium: 4.5 mmol/L (ref 3.5–5.1)
Sodium: 136 mmol/L (ref 135–145)
Total Bilirubin: 0.4 mg/dL (ref 0.3–1.2)
Total Protein: 6.6 g/dL (ref 6.5–8.1)

## 2022-11-26 LAB — CBC WITH DIFFERENTIAL/PLATELET
Abs Immature Granulocytes: 0.01 10*3/uL (ref 0.00–0.07)
Basophils Absolute: 0.1 10*3/uL (ref 0.0–0.1)
Basophils Relative: 1 %
Eosinophils Absolute: 0 10*3/uL (ref 0.0–0.5)
Eosinophils Relative: 1 %
HCT: 35.8 % — ABNORMAL LOW (ref 36.0–46.0)
Hemoglobin: 11.7 g/dL — ABNORMAL LOW (ref 12.0–15.0)
Immature Granulocytes: 0 %
Lymphocytes Relative: 49 %
Lymphs Abs: 2.9 10*3/uL (ref 0.7–4.0)
MCH: 27.7 pg (ref 26.0–34.0)
MCHC: 32.7 g/dL (ref 30.0–36.0)
MCV: 84.8 fL (ref 80.0–100.0)
Monocytes Absolute: 0.6 10*3/uL (ref 0.1–1.0)
Monocytes Relative: 10 %
Neutro Abs: 2.4 10*3/uL (ref 1.7–7.7)
Neutrophils Relative %: 39 %
Platelets: 299 10*3/uL (ref 150–400)
RBC: 4.22 MIL/uL (ref 3.87–5.11)
RDW: 12.6 % (ref 11.5–15.5)
WBC: 6 10*3/uL (ref 4.0–10.5)
nRBC: 0 % (ref 0.0–0.2)

## 2022-11-26 NOTE — Progress Notes (Signed)
NN met with pt face to face today at her consult appt with Dr Arbutus Ped. Pt was accompanied by her sister, Steward Drone. Per Dr Arbutus Ped, pt will be on active surveillance. She will need to follow up with Dr.Mohamed in 6 months after having her surveillance CT scan approx 5-7 days prior to her appt. NN escorted pt to scheduler after her appt to make her f/u appt in 6 months. NN provided pt with contact information and encouraged her to call NN with any questions or concerns.

## 2022-11-26 NOTE — Progress Notes (Signed)
[facility] [phone]   [phone]  CONSULT NOTE  REFERRING PHYSICIAN: Dr. Charlett Lango  REASON FOR CONSULTATION:  77 years old African-American female recently diagnosed with lung cancer.  HPI Audrey Green is a 77 y.o. female with past medical history significant for atrial fibrillation, hypertension, diabetes mellitus, chronic kidney disease, hepatitis C status posttreatment, goiter, hypertension, osteoporosis as well as osteoarthritis and atrial fibrillation.  The patient also has a history of smoking but quit 13 years ago.  She has known history of pulmonary nodule that has been going on for almost 15 years and she was followed closely with imaging studies but she was lost to follow-up during the COVID period.  She had repeat CT scan of the chest without contrast on 10/01/2022 and that showed interval mild enlargement of the part solid nodule posteriorly in the right upper lobe measuring 2.0 x 1.3 cm.  There was also unchanged part solid nodule peripherally in the right lower lobe and it measured 1.3 x 0.6 cm.  There was also left upper lobe groundglass nodule measuring 0.9 cm that is stable.  The patient was referred to Dr. Dorris Fetch and on 10/25/2022 she underwent robotic assisted right upper and lower lobe wedge resections with lymph node biopsies.  The final pathology [id number]) of the wedge resection from the right upper lobe showed invasive well-differentiated adenocarcinoma measuring 1.2 x 0.9 x 0.8 cm.  The wedge resection of the right lower lobe also showed invasive well-differentiated adenocarcinoma measuring 1.2 cm in greatest dimension.  There was no evidence of metastatic disease to the dissected lymph node and no evidence for visceral pleural or lymphovascular invasion.  The resection margins were negative for malignancy.  Her surgery was complicated by pneumothorax that was treated by Dr. Dorris Fetch.  The molecular studies by foundation  1 showed no evidence for actionable mutations and PD-L1 expression was 0%. Dr. Dorris Fetch kindly referred the patient to me today for evaluation and recommendation regarding treatment of her condition. When seen today the patient is feeling fine except for abdominal bloating and gassiness as well as constipation.  She lost around 15 pounds in the last 2 months.  She has intermittent nausea and vomiting.  She denied having any current shortness of breath or cough she has no visual changes but mild headache. Family history significant for father with diabetes and heart disease.  Mother had breast cancer in her 58s but died at age 33. The patient is single and has no children.  She used to work as a Science writer for [employer].  She has a history for smoking for around 15 years but quit 13 years ago.  She has no history of alcohol or drug abuse.  She was accompanied today by her Stephanie Coup. HPI  Past Medical History:  Diagnosis Date   A-fib [facility])    Allergy    Aortic atherosclerosis (HCC)    Arthritis    Chronic kidney disease    stage 3   Complication of anesthesia    Diabetes mellitus    Dyspnea    with exertion   Fibroids    GERD (gastroesophageal reflux disease)    Goiter    Heart murmur    Hepatitis    from blood transfusion, was treated in 2014   History of blood transfusion    History of colon polyps    Hyperlipidemia    Hypertension    Osteoporosis    Palpitations    Pancreatitis  PONV (postoperative nausea and vomiting)    years ago   Pulmonary emphysema (HCC)    Seasonal allergies     Past Surgical History:  Procedure Laterality Date   ABDOMINAL HYSTERECTOMY  06/12/1975   CARPAL TUNNEL RELEASE Bilateral    CHOLECYSTECTOMY     COLONOSCOPY W/ POLYPECTOMY     INTERCOSTAL NERVE BLOCK  10/25/2022   Procedure: INTERCOSTAL NERVE BLOCK;  Surgeon: Loreli Slot, MD;  Location: [facility] OR;  Service: Thoracic;;   LUMBAR LAMINECTOMY/DECOMPRESSION  MICRODISCECTOMY Left 06/24/2014   Procedure: Left Lumbar four-five microdiskectomy;  Surgeon: Coletta Memos, MD;  Location: [facility];  Service: Neurosurgery;  Laterality: Left;  Left Lumbar four-five microdiskectomy   LYMPH NODE BIOPSY N/A 10/25/2022   Procedure: LYMPH NODE BIOPSY;  Surgeon: Loreli Slot, MD;  Location: [facility] OR;  Service: Thoracic;  Laterality: N/A;   TONSILLECTOMY     VIDEO BRONCHOSCOPY WITH ENDOBRONCHIAL NAVIGATION N/A 10/25/2022   Procedure: VIDEO BRONCHOSCOPY WITH ENDOBRONCHIAL NAVIGATION;  Surgeon: Loreli Slot, MD;  Location: [facility] OR;  Service: Thoracic;  Laterality: N/A;    Family History  Problem Relation Age of Onset   Breast cancer Mother    Diabetes Mother    Heart disease Mother    Colon cancer Neg Hx     Social History Social History   Tobacco Use   Smoking status: Former    Years: 11    Types: Cigarettes    Quit date: 06/11/2008    Years since quitting: 14.4   Smokeless tobacco: Never  Vaping Use   Vaping Use: Never used  Substance Use Topics   Alcohol use: No   Drug use: No    Allergies  Allergen Reactions   Atorvastatin     Other reaction(s): Muscle Pain   Codeine Hives   Penicillins Hives and Swelling    Tongue and throat swelling  Has patient had a PCN reaction causing immediate rash, facial/tongue/throat swelling, SOB or lightheadedness with hypotension: Yes Has patient had a PCN reaction causing severe rash involving mucus membranes or skin necrosis: Yes Has patient had a PCN reaction that required hospitalization Yes Has patient had a PCN reaction occurring within the last 10 years: No If all of the above answers are "NO", then may proceed with Cephalosporin use.   Sulfa Antibiotics Hives    Current Outpatient Medications  Medication Sig Dispense Refill   famotidine (PEPCID) 20 MG tablet Take 20 mg by mouth daily.     aspirin EC 81 MG tablet Take 81 mg by mouth daily.     cetirizine (ZYRTEC) 10 MG tablet Take 10  mg by mouth at bedtime.      Cholecalciferol (VITAMIN D-1000 MAX ST) 1000 units tablet Take 1,000 Units by mouth daily.     Continuous Glucose Sensor (DEXCOM G7 SENSOR) MISC Use 1 each every 10 (ten) days to check blood sugars 6 times per day     diltiazem (CARDIZEM CD) 120 MG 24 hr capsule Take 120 mg by mouth daily.     esomeprazole (NEXIUM) 40 MG capsule TAKE 1 CAPSULE (40MG  TOTAL ) BY MOUTH 2 TIMES DAILY 180 capsule 1   FARXIGA 10 MG TABS tablet Take 10 mg by mouth daily.     fenofibrate (TRICOR) 145 MG tablet TAKE 1 TABLET (145 MG) BY ORAL ROUTE ONCE DAILY  4   fluticasone (FLONASE) 50 MCG/ACT nasal spray Place 2 sprays into both nostrils daily as needed for allergies.     glucose blood (ONE  TOUCH ULTRA TEST) test strip Check blood sugar three times daily and as directed. Dx 250.4 100 each 5   hydrOXYzine (ATARAX/VISTARIL) 25 MG tablet Take 25 mg by mouth daily.     insulin aspart (NOVOLOG FLEXPEN) 100 UNIT/ML FlexPen Inject 10-15 Units into the skin 3 (three) times daily before meals.     Insulin Pen Needle (ULTICARE MINI PEN NEEDLES) 31G X 6 MM MISC Use to inject insulin daily 200 each 1   isosorbide mononitrate (IMDUR) 30 MG 24 hr tablet Take 30 mg by mouth daily.  3   lisinopril-hydrochlorothiazide (PRINZIDE,[name]) 20-25 MG tablet Take 1 tablet by mouth daily.  3   Multiple Vitamin (MULITIVITAMIN WITH MINERALS) TABS Take 1 tablet by mouth daily. Centrum Silver     ONE TOUCH LANCETS MISC Use to check blood sugar twice a day     OZEMPIC, 1 MG/DOSE, 4 MG/3ML SOPN Inject 1 mg into the skin every Saturday.     pantoprazole (PROTONIX) 40 MG tablet Take 40 mg by mouth 2 (two) times daily.     pravastatin (PRAVACHOL) 20 MG tablet Take 20 mg by mouth at bedtime.     TRESIBA FLEXTOUCH 200 UNIT/ML SOPN Inject 44 Units into the skin at bedtime.  3   No current facility-administered medications for this visit.    Review of Systems  Constitutional: positive for fatigue and weight  loss Eyes: negative Ears, nose, mouth, throat, and face: negative Respiratory: positive for pleurisy/chest pain Cardiovascular: negative Gastrointestinal: positive for abdominal pain, constipation, and nausea Genitourinary:negative Integument/breast: negative Hematologic/lymphatic: negative Musculoskeletal:negative Neurological: positive for headaches Behavioral/Psych: negative Endocrine: negative Allergic/Immunologic: negative  Physical Exam  ZOX:WRUEA, healthy, no distress, well nourished, and well developed SKIN: skin color, texture, turgor are normal, no rashes or significant lesions HEAD: Normocephalic, No masses, lesions, tenderness or abnormalities EYES: normal, PERRLA, Conjunctiva are pink and non-injected EARS: External ears normal, Canals clear OROPHARYNX:no exudate, no erythema, and lips, buccal mucosa, and tongue normal  NECK: supple, no adenopathy, no JVD LYMPH:  no palpable lymphadenopathy, no hepatosplenomegaly BREAST:not examined LUNGS: clear to auscultation , and palpation HEART: regular rate & rhythm, no murmurs, and no gallops ABDOMEN:abdomen soft, non-tender, normal bowel sounds, and no masses or organomegaly BACK: Back symmetric, no curvature., No CVA tenderness EXTREMITIES:no joint deformities, effusion, or inflammation, no edema  NEURO: alert & oriented x 3 with fluent speech, no focal motor/sensory deficits  PERFORMANCE STATUS: ECOG 1  LABORATORY DATA: Lab Results  Component Value Date   WBC 6.0 11/26/2022   HGB 11.7 (L) 11/26/2022   HCT 35.8 (L) 11/26/2022   MCV 84.8 11/26/2022   PLT 299 11/26/2022      Chemistry      Component Value Date/Time   NA 131 (L) 11/01/2022 0017   NA 135 (L) 08/06/2012 2043   K 4.4 11/01/2022 0017   K 3.7 08/06/2012 2043   CL 99 11/01/2022 0017   CL 102 08/06/2012 2043   CO2 23 11/01/2022 0017   CO2 26 08/06/2012 2043   BUN 35 (H) 11/01/2022 0017   BUN 21 (H) 08/06/2012 2043   CREATININE 2.08 (H) 11/01/2022  0017   CREATININE 1.36 (H) 08/06/2012 2043   CREATININE 1.58 (H) 09/27/2011 1337      Component Value Date/Time   CALCIUM 8.0 (L) 11/01/2022 0017   CALCIUM 8.9 08/06/2012 2043   ALKPHOS 45 10/27/2022 0011   ALKPHOS 142 (H) 08/06/2012 2043   AST 26 10/27/2022 0011   AST 19 08/06/2012 2043  ALT 25 10/27/2022 0011   ALT 29 08/06/2012 2043   BILITOT 0.5 10/27/2022 0011   BILITOT 0.2 08/06/2012 2043       RADIOGRAPHIC STUDIES: DG Chest 2 View  Result Date: 11/08/2022 CLINICAL DATA:  Status post right lower lobectomy EXAM: CHEST - 2 VIEW COMPARISON:  10/30/2022 FINDINGS: The heart size and mediastinal contours are within normal limits. Moderate right pleural effusion with associated atelectasis or consolidation and elevation of the right hemidiaphragm. The visualized skeletal structures are unremarkable. IMPRESSION: Moderate right pleural effusion with associated atelectasis or consolidation and elevation of the right hemidiaphragm. Lungs are otherwise normally aerated. Electronically Signed   By: Jearld Lesch M.D.   On: 11/08/2022 15:29   DG Chest 2 View  Result Date: 10/30/2022 CLINICAL DATA:  Status post partial right lung resection. EXAM: CHEST - 2 VIEW COMPARISON:  10/29/2022 FINDINGS: Cardiomediastinal contours are stable. Postoperative changes identified within the right mid and right upper lung. No pneumothorax identified. Asymmetric right lung volume loss is noted with elevation of the right hemidiaphragm. Left lower lung atelectasis is similar to the previous exam. IMPRESSION: 1. Status post partial right lung resection. No pneumothorax. 2. Left lower lung atelectasis. Electronically Signed   By: Signa Kell M.D.   On: 10/30/2022 08:20   US RENAL  Result Date: 10/29/2022 CLINICAL DATA:  Renal dysfunction EXAM: RENAL / URINARY TRACT ULTRASOUND COMPLETE COMPARISON:  07/27/2022 FINDINGS: Right Kidney: Renal measurements: 10.1 x 5.1 x 3.9 cm = volume: 106.3 mL. Echogenicity within  normal limits. No mass or hydronephrosis visualized. Left Kidney: Renal measurements: 9.1 x 4.5 x 3.7 cm = volume: 77.6 mL. Technologist observed increased cortical echogenicity, possibly technical artifact. No mass or hydronephrosis visualized. Bladder: Appears normal for degree of bladder distention. Other: None. IMPRESSION: There is no hydronephrosis. Electronically Signed   By: Ernie Avena M.D.   On: 10/29/2022 17:17   DG Chest Port 1 View  Result Date: 10/29/2022 CLINICAL DATA:  [id number] S/P partial lobectomy of lung [id number] EXAM: PORTABLE CHEST - 1 VIEW COMPARISON:  the previous day's study FINDINGS: Persistent low lung volumes. Stable linear scarring or atelectasis at the left lung base. Staple lines at the right hilum. Heart size and mediastinal contours are within normal limits. No effusion. Visualized bones unremarkable.  Right upper quadrant clips. IMPRESSION: Low volumes. No acute findings. Electronically Signed   By: Corlis Leak M.D.   On: 10/29/2022 10:36   DG Chest 2 View  Result Date: 10/28/2022 CLINICAL DATA:  Partial right lung resection 5/16. Chest tube removal. EXAM: CHEST - 2 VIEW COMPARISON:  Yesterday FINDINGS: Suboptimal lateral view secondary to patient positioning and overlying artifact. Wires and leads project over the chest on the frontal radiograph. Decreased right-sided subcutaneous emphysema. Moderate right hemidiaphragm elevation. Normal heart size. No pleural effusion or pneumothorax. Low lung volumes with resultant pulmonary interstitial prominence. Surgical sutures in the right infrahilar region. Minimal bibasilar subsegmental atelectasis. IMPRESSION: Low lung volumes with right hemithorax volume loss but no pneumothorax or other acute process. Decreased subcutaneous emphysema about the right chest wall. Electronically Signed   By: Jeronimo Greaves M.D.   On: 10/28/2022 10:47   DG Chest 1V REPEAT Same Day  Result Date: 10/27/2022 CLINICAL DATA:  Status post partial  right lung resection, chest tube removal EXAM: CHEST - 1 VIEW SAME DAY COMPARISON:  10/27/2022 FINDINGS: Single frontal view of the chest demonstrates interval removal of the right-sided chest tube. There is subcutaneous gas within the right lateral chest wall.  No evidence of pneumothorax. The cardiac silhouette is unremarkable. Postsurgical changes from partial right pneumonectomy. Stable elevation of the right hemidiaphragm. Left basilar subsegmental atelectasis. No effusion or pneumothorax. IMPRESSION: 1. No complication after right chest tube removal. No evidence of pneumothorax. 2. Left basilar subsegmental atelectasis. 3. Postsurgical changes from partial right lung resection. Electronically Signed   By: Sharlet Salina M.D.   On: 10/27/2022 18:07    ASSESSMENT: This is a very pleasant 77 years old African-American female diagnosed with stage Ia (T1b, N0, M0) non-small cell lung cancer, adenocarcinoma of the right upper lobe as well as stage Ia (T1b, N0, M0) non-small cell lung cancer, adenocarcinoma involving the right lower lobe status post wedge resection of the right upper and lower lobes with lymph node dissection under the care of Dr. Dorris Fetch on Oct 25, 2022. Molecular studies molecular studies by foundation 1 showed no evidence for actionable mutations and PD-L1 expression was 0%  PLAN: I had a lengthy discussion with the patient and her sister today about her current disease stage, prognosis and treatment options. I explained to the patient that there is no survival benefit for adjuvant systemic chemotherapy for patient with a stage Ia non-small cell lung cancer.  The patient has evidence for multifocal disease with persistent groundglass opacity in the left lung that need close monitoring. I recommended for her to continue on observation with repeat CT scan of the chest in 6 months. For the abdominal bloating and constipation.  She was advised to use Gas-X as well as stool softener or  MiraLAX.  She will reach out to her primary care physician for additional evaluation. The patient was advised to call immediately if she has any other concerning symptoms in the interval. The patient voices understanding of current disease status and treatment options and is in agreement with the current care plan.  All questions were answered. The patient knows to call the clinic with any problems, questions or concerns. We can certainly see the patient much sooner if necessary.  Thank you so much for allowing me to participate in the care of Audrey Green. I will continue to follow up the patient with you and assist in her care.  The total time spent in the appointment was 60 minutes.  Disclaimer: This note was dictated with voice recognition software. Similar sounding words can inadvertently be transcribed and may not be corrected upon review.   Lajuana Matte November 26, 2022, 2:27 PM

## 2022-12-20 ENCOUNTER — Other Ambulatory Visit: Payer: Self-pay | Admitting: Thoracic Surgery (Cardiothoracic Vascular Surgery)

## 2022-12-20 DIAGNOSIS — R918 Other nonspecific abnormal finding of lung field: Secondary | ICD-10-CM

## 2022-12-25 ENCOUNTER — Ambulatory Visit (INDEPENDENT_AMBULATORY_CARE_PROVIDER_SITE_OTHER): Payer: Self-pay | Admitting: Thoracic Surgery (Cardiothoracic Vascular Surgery)

## 2022-12-25 ENCOUNTER — Ambulatory Visit
Admission: RE | Admit: 2022-12-25 | Discharge: 2022-12-25 | Disposition: A | Discharge status: Home / Self Care | Payer: Medicare HMO | Source: Ambulatory Visit | Attending: Thoracic Surgery (Cardiothoracic Vascular Surgery) | Admitting: Thoracic Surgery (Cardiothoracic Vascular Surgery)

## 2022-12-25 VITALS — BP 118/63 | HR 89 | Resp 18 | Ht 63.0 in | Wt 136.0 lb

## 2022-12-25 DIAGNOSIS — R918 Other nonspecific abnormal finding of lung field: Secondary | ICD-10-CM

## 2022-12-25 DIAGNOSIS — Z09 Encounter for follow-up examination after completed treatment for conditions other than malignant neoplasm: Secondary | ICD-10-CM

## 2022-12-25 MED ORDER — PREGABALIN 25 MG PO CAPS: 25.0000 mg | ORAL_CAPSULE | Freq: Two times a day (BID) | ORAL | 3 refills | Status: DC

## 2022-12-25 NOTE — Progress Notes (Signed)
[address]       Audrey Green [address]             [phone]     HPI: Audrey Green returns for scheduled follow-up visit after lung resection.  Audrey Green is a 77 year old woman with a history of bilateral lung nodules, remote tobacco use (quit 2010), hypertension, hyperlipidemia, aortic atherosclerosis, heart murmur, insulin-dependent diabetes, chronic kidney disease, hepatitis C, hepatic fibrosis, and osteoporosis.  She had been followed for many years for bilateral groundglass opacities in the lung.  On a recent scan she had progression of a right upper lobe nodule.  I did a robotic assisted wedge resection of the right upper and lower lobe nodules on 10/25/2022.  Both nodules turned out to be adenocarcinomas.  They were felt to be synchronous primaries.  Her biggest complaint postoperatively was gas pains and constipation.  She did develop acute renal insufficiency but her renal function returned to normal prior to discharge.  I saw her postoperatively on 11/08/2022.  She was having some incisional pains but was taking tramadol sparingly.  She saw Dr. Arbutus Ped.  No adjuvant therapy is indicated.  She will continue to be monitored.  Continues to have incisional pain.  She describes this as a that is fairly constant.  She is taking Tylenol for the pain but often will wake up in the middle the night and have difficulty getting back to sleep.  Past Medical History:  Diagnosis Date   A-fib [facility])    Allergy    Aortic atherosclerosis (HCC)    Arthritis    Chronic kidney disease    stage 3   Complication of anesthesia    Diabetes mellitus    Dyspnea    with exertion   Fibroids    GERD (gastroesophageal reflux disease)    Goiter    Heart murmur    Hepatitis    from blood transfusion, was treated in 2014   History of blood transfusion    History of colon polyps    Hyperlipidemia    Hypertension    Osteoporosis    Palpitations    Pancreatitis    PONV  (postoperative nausea and vomiting)    years ago   Pulmonary emphysema (HCC)    Seasonal allergies     Current Outpatient Medications  Medication Sig Dispense Refill   aspirin EC 81 MG tablet Take 81 mg by mouth daily.     cetirizine (ZYRTEC) 10 MG tablet Take 10 mg by mouth at bedtime.      Cholecalciferol (VITAMIN D-1000 MAX ST) 1000 units tablet Take 1,000 Units by mouth daily.     Continuous Glucose Sensor (DEXCOM G7 SENSOR) MISC Use 1 each every 10 (ten) days to check blood sugars 6 times per day     diltiazem (CARDIZEM CD) 120 MG 24 hr capsule Take 120 mg by mouth daily.     esomeprazole (NEXIUM) 40 MG capsule TAKE 1 CAPSULE (40MG  TOTAL ) BY MOUTH 2 TIMES DAILY 180 capsule 1   famotidine (PEPCID) 20 MG tablet Take 20 mg by mouth daily.     FARXIGA 10 MG TABS tablet Take 10 mg by mouth daily.     fenofibrate (TRICOR) 145 MG tablet TAKE 1 TABLET (145 MG) BY ORAL ROUTE ONCE DAILY  4   fluticasone (FLONASE) 50 MCG/ACT nasal spray Place 2 sprays into both nostrils daily as needed for allergies.     glucose blood (ONE TOUCH ULTRA TEST) test  strip Check blood sugar three times daily and as directed. Dx 250.4 100 each 5   hydrOXYzine (ATARAX/VISTARIL) 25 MG tablet Take 25 mg by mouth daily.     insulin aspart (NOVOLOG FLEXPEN) 100 UNIT/ML FlexPen Inject 10-15 Units into the skin 3 (three) times daily before meals.     Insulin Pen Needle (ULTICARE MINI PEN NEEDLES) 31G X 6 MM MISC Use to inject insulin daily 200 each 1   isosorbide mononitrate (IMDUR) 30 MG 24 hr tablet Take 30 mg by mouth daily.  3   lisinopril-hydrochlorothiazide (PRINZIDE,[name]) 20-25 MG tablet Take 1 tablet by mouth daily.  3   Multiple Vitamin (MULITIVITAMIN WITH MINERALS) TABS Take 1 tablet by mouth daily. Centrum Silver     ONE TOUCH LANCETS MISC Use to check blood sugar twice a day     OZEMPIC, 1 MG/DOSE, 4 MG/3ML SOPN Inject 1 mg into the skin every Saturday.     pantoprazole (PROTONIX) 40 MG tablet Take 40 mg  by mouth 2 (two) times daily.     pravastatin (PRAVACHOL) 20 MG tablet Take 20 mg by mouth at bedtime.     pregabalin (LYRICA) 25 MG capsule Take 1 capsule (25 mg total) by mouth 2 (two) times daily. 60 capsule 3   TRESIBA FLEXTOUCH 200 UNIT/ML SOPN Inject 44 Units into the skin at bedtime.  3   vitamin B-12 (CYANOCOBALAMIN) 100 MCG tablet Take 100 mcg by mouth daily.     vitamin E 45 MG (100 UNITS) capsule Take by mouth daily.     No current facility-administered medications for this visit.    Physical Exam BP 118/63   Pulse 89   Resp 18   Ht 5\' 3"  (1.6 m)   Wt 136 lb (61.7 kg)   SpO2 96% Comment: ra  BMI 24.93 kg/m  77 year old woman in no acute distress Alert and oriented x 3 with no focal deficits Incisions well-healed Lungs diminished breath sounds at right base but otherwise clear Cardiac regular rate and rhythm  Diagnostic Tests: CHEST - 2 VIEW   COMPARISON:  Chest radiograph 11/08/2022   FINDINGS: The cardiomediastinal silhouette is stable.   There is unchanged elevation of the right hemidiaphragm. A small right pleural effusion has decreased in size. Postsurgical changes projecting over the right lung are unchanged. The left lung is clear. There is no left effusion. There is no pneumothorax   There is no acute osseous abnormality.   IMPRESSION: 1. Small right pleural effusion is decreased in size since 11/08/2022. 2. Unchanged elevation of the right hemidiaphragm. No new or worsening focal airspace disease.     Electronically Signed   By: Lesia Hausen M.D.   On: 12/25/2022 13:45 I personally reviewed the chest x-ray images and concur with the findings noted above.  Elevated hemidiaphragm noted on preop film and scalp film from CT prior to surgery but more pronounced postoperatively.  Impression: Audrey Green is a 76 year old woman with a history of bilateral lung nodules, remote tobacco use (quit 2010), hypertension, hyperlipidemia, aortic atherosclerosis,  heart murmur, insulin-dependent diabetes, chronic kidney disease, hepatitis C, hepatic fibrosis, and osteoporosis.  Followed for many years with multiple groundglass opacities in her lung.  Recently had progression of the nodule.  Underwent wedge resections of upper and lower lobe nodules on the right, both which turned out to be adenocarcinomas.  Adenocarcinoma of the lung-synchronous upper and lower lobe primaries.  No actionable mutations with molecular testing.  PD-L1 was 0.  No indication for adjuvant  therapy currently.  Plan is for observation.  She does have multiple other lung nodules that are concerning for potential low-grade adenocarcinoma.  Postoperative incisional pain-really seems to be complaining more of that now that she did her last visit.  She is no longer using tramadol.  She says her primary care physician was going to prescribe some type of pain medication for her.  Her pain suggest intercostal neuralgia.  I think she might benefit from Lyrica.  Will start with 25 mg p.o. twice daily, 60 tablets, 3 refills.  No restrictions on her activities at this point.  Plan: Lyrica 25 mg p.o. twice daily Return in 3 months with PA and lateral chest x-ray to check on progress  Loreli Slot, MD [facility] [phone]

## 2022-12-27 NOTE — Telephone Encounter (Signed)
This encounter was created in error - please disregard.

## 2022-12-28 ENCOUNTER — Telehealth: Payer: Self-pay

## 2022-12-28 MED ORDER — GABAPENTIN 300 MG PO CAPS: 300.0000 mg | ORAL_CAPSULE | Freq: Two times a day (BID) | ORAL | 0 refills | Status: DC

## 2022-12-28 NOTE — Telephone Encounter (Signed)
Called [name], she is aware of medication change and will send into patient's requested pharmacy, [facility] in [place].

## 2022-12-28 NOTE — Telephone Encounter (Signed)
-----   Message from Loreli Slot sent at 12/27/2022  4:58 PM EDT ----- Regarding: RE: Insurance denied Lyrica That is fine 300 mg BID, 60 tablets, no refills   [facility] ----- Message ----- From: Steve Rattler, RN Sent: 12/27/2022   4:34 PM EDT To: Loreli Slot, MD Subject: Insurance denied Lyrica                        Hey,  Insurance did not approve Lyrica because Gabapentin was not attempted first. Can we switch the prescription to Gabapentin? I did call the patient and she is aware, she has tried Gabapentin before in the past and has done well with it.  [facility] [place], [place] - [address]  Phone: [phone] Fax: [phone]   Thanks, Morrie Sheldon

## 2023-03-25 ENCOUNTER — Other Ambulatory Visit: Payer: Self-pay | Admitting: Thoracic Surgery (Cardiothoracic Vascular Surgery)

## 2023-03-25 DIAGNOSIS — R918 Other nonspecific abnormal finding of lung field: Secondary | ICD-10-CM

## 2023-03-26 ENCOUNTER — Encounter: Payer: Self-pay | Admitting: Cardiothoracic Surgery

## 2023-03-26 ENCOUNTER — Ambulatory Visit: Payer: Medicare HMO | Admitting: Cardiothoracic Surgery

## 2023-03-26 ENCOUNTER — Ambulatory Visit
Admission: RE | Admit: 2023-03-26 | Discharge: 2023-03-26 | Disposition: A | Discharge status: Home / Self Care | Payer: Medicare HMO | Source: Ambulatory Visit | Attending: Thoracic Surgery (Cardiothoracic Vascular Surgery) | Admitting: Thoracic Surgery (Cardiothoracic Vascular Surgery)

## 2023-03-26 VITALS — BP 165/67 | HR 64 | Resp 18 | Ht 63.0 in | Wt 136.0 lb

## 2023-03-26 DIAGNOSIS — Z09 Encounter for follow-up examination after completed treatment for conditions other than malignant neoplasm: Secondary | ICD-10-CM | POA: Diagnosis not present

## 2023-03-26 DIAGNOSIS — R918 Other nonspecific abnormal finding of lung field: Secondary | ICD-10-CM

## 2023-03-26 NOTE — Progress Notes (Signed)
HPI: Patient examined, images of today's chest x-ray personally reviewed and discussed with patient. 77 year old diabetic patient of Dr. 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  She did not meet criteria for adjunctive therapy and she has done very well.  Her postoperative pain has resolved and she stopped taking the Lyrica prescribed at her last visit.  She denies shortness of breath or cough and is resumed her normal activity levels.  Today's chest x-ray shows normal postoperative changes with volume loss in the right lung and elevated right hemidiaphragm, stable from previous x-ray.  There is no pleural effusion.  There is no evidence of atelectasis or pulmonary mass.  Current Outpatient Medications  Medication Sig Dispense Refill   aspirin EC 81 MG tablet Take 81 mg by mouth daily.     cetirizine (ZYRTEC) 10 MG tablet Take 10 mg by mouth at bedtime.      Cholecalciferol (VITAMIN D-1000 MAX ST) 1000 units tablet Take 1,000 Units by mouth daily.     Continuous Glucose Sensor (DEXCOM G7 SENSOR) MISC Use 1 each every 10 (ten) days to check blood sugars 6 times per day     diltiazem (CARDIZEM CD) 120 MG 24 hr capsule Take 120 mg by mouth daily.     esomeprazole (NEXIUM) 40 MG capsule TAKE 1 CAPSULE (40MG  TOTAL ) BY MOUTH 2 TIMES DAILY 180 capsule 1   famotidine (PEPCID) 20 MG tablet Take 20 mg by mouth daily.     FARXIGA 10 MG TABS tablet Take 10 mg by mouth daily.     fenofibrate (TRICOR) 145 MG tablet TAKE 1 TABLET (145 MG) BY ORAL ROUTE ONCE DAILY  4   fluticasone (FLONASE) 50 MCG/ACT nasal spray Place 2 sprays into both nostrils daily as needed for allergies.     glucose blood (ONE TOUCH ULTRA TEST) test strip Check blood sugar three times daily and as directed. Dx 250.4 100 each 5   hydrOXYzine (ATARAX/VISTARIL) 25 MG tablet Take 25 mg by mouth daily.     insulin  aspart (NOVOLOG FLEXPEN) 100 UNIT/ML FlexPen Inject 10-15 Units into the skin 3 (three) times daily before meals.     Insulin Pen Needle (ULTICARE MINI PEN NEEDLES) 31G X 6 MM MISC Use to inject insulin daily 200 each 1   isosorbide mononitrate (IMDUR) 30 MG 24 hr tablet Take 30 mg by mouth daily.  3   lisinopril-hydrochlorothiazide (PRINZIDE,[name]) 20-25 MG tablet Take 1 tablet by mouth daily.  3   Multiple Vitamin (MULITIVITAMIN WITH MINERALS) TABS Take 1 tablet by mouth daily. Centrum Silver     ONE TOUCH LANCETS MISC Use to check blood sugar twice a day     OZEMPIC, 1 MG/DOSE, 4 MG/3ML SOPN Inject 1 mg into the skin every Saturday.     pantoprazole (PROTONIX) 40 MG tablet Take 40 mg by mouth 2 (two) times daily.     pravastatin (PRAVACHOL) 20 MG tablet Take 20 mg by mouth at bedtime.     TRESIBA FLEXTOUCH 200 UNIT/ML SOPN Inject 44 Units into the skin at bedtime.  3   vitamin B-12 (CYANOCOBALAMIN) 100 MCG tablet Take 100 mcg by mouth daily.     vitamin E 45 MG (100 UNITS) capsule Take by mouth daily.     No current facility-administered medications for this visit.     Physical Exam: Blood pressure (!) 165/67, pulse 64, resp. rate 18, height  5\' 3"  (1.6 m), weight 136 lb (61.7 kg), SpO2 99%.   General-alert and comfortable Neck-no palpable adenopathy COR-regular rhythm without murmur Lungs-clear breath sounds bilaterally Neuro-no focal deficit  Diagnostic Tests: PA and lateral chest x-ray performed today is clear with stable postoperative changes  Impression: Doing well 6 months postop robotic wedge resection of right upper and lower right lower lobe stage I adenocarcinoma. Patient is doing well withminimal pain and has resumed her normal activities  Plan: The patient is scheduled to see Dr. Shirline Frees later this year. She will be set up for 1 year postop appointment with Dr. Dorris Fetch with CT scan of the chest without contrast since she her creatinine postop increased to  2.5 and she is still being followed by renal.  Lovett Sox, MD [facility] [phone]

## 2023-05-22 ENCOUNTER — Inpatient Hospital Stay: Payer: Medicare HMO | Attending: Internal Medicine

## 2023-05-22 DIAGNOSIS — G8912 Acute post-thoracotomy pain: Secondary | ICD-10-CM | POA: Insufficient documentation

## 2023-05-22 DIAGNOSIS — C3490 Malignant neoplasm of unspecified part of unspecified bronchus or lung: Secondary | ICD-10-CM

## 2023-05-22 DIAGNOSIS — C3431 Malignant neoplasm of lower lobe, right bronchus or lung: Secondary | ICD-10-CM | POA: Insufficient documentation

## 2023-05-22 DIAGNOSIS — C3411 Malignant neoplasm of upper lobe, right bronchus or lung: Secondary | ICD-10-CM | POA: Insufficient documentation

## 2023-05-22 LAB — CBC WITH DIFFERENTIAL (CANCER CENTER ONLY)
Abs Immature Granulocytes: 0.01 10*3/uL (ref 0.00–0.07)
Basophils Absolute: 0.1 10*3/uL (ref 0.0–0.1)
Basophils Relative: 1 %
Eosinophils Absolute: 0.2 10*3/uL (ref 0.0–0.5)
Eosinophils Relative: 3 %
HCT: 36.8 % (ref 36.0–46.0)
Hemoglobin: 12 g/dL (ref 12.0–15.0)
Immature Granulocytes: 0 %
Lymphocytes Relative: 49 %
Lymphs Abs: 3.1 10*3/uL (ref 0.7–4.0)
MCH: 28.4 pg (ref 26.0–34.0)
MCHC: 32.6 g/dL (ref 30.0–36.0)
MCV: 87.2 fL (ref 80.0–100.0)
Monocytes Absolute: 0.6 10*3/uL (ref 0.1–1.0)
Monocytes Relative: 10 %
Neutro Abs: 2.4 10*3/uL (ref 1.7–7.7)
Neutrophils Relative %: 37 %
Platelet Count: 325 10*3/uL (ref 150–400)
RBC: 4.22 MIL/uL (ref 3.87–5.11)
RDW: 12.4 % (ref 11.5–15.5)
WBC Count: 6.3 10*3/uL (ref 4.0–10.5)
nRBC: 0 % (ref 0.0–0.2)

## 2023-05-22 LAB — CMP (CANCER CENTER ONLY)
ALT: 13 U/L (ref 0–44)
AST: 14 U/L — ABNORMAL LOW (ref 15–41)
Albumin: 4.1 g/dL (ref 3.5–5.0)
Alkaline Phosphatase: 68 U/L (ref 38–126)
Anion gap: 7 (ref 5–15)
BUN: 37 mg/dL — ABNORMAL HIGH (ref 8–23)
CO2: 28 mmol/L (ref 22–32)
Calcium: 9.4 mg/dL (ref 8.9–10.3)
Chloride: 103 mmol/L (ref 98–111)
Creatinine: 2.33 mg/dL — ABNORMAL HIGH (ref 0.44–1.00)
GFR, Estimated: 21 mL/min — ABNORMAL LOW (ref >60)
Glucose, Bld: 133 mg/dL — ABNORMAL HIGH (ref 70–99)
Potassium: 4.9 mmol/L (ref 3.5–5.1)
Sodium: 138 mmol/L (ref 135–145)
Total Bilirubin: 0.4 mg/dL (ref <1.2)
Total Protein: 7.2 g/dL (ref 6.5–8.1)

## 2023-05-29 ENCOUNTER — Ambulatory Visit (HOSPITAL_COMMUNITY)
Admission: RE | Admit: 2023-05-29 | Discharge: 2023-05-29 | Disposition: A | Discharge status: Home / Self Care | Payer: Medicare HMO | Source: Ambulatory Visit | Attending: Internal Medicine | Admitting: Internal Medicine

## 2023-05-29 DIAGNOSIS — C3490 Malignant neoplasm of unspecified part of unspecified bronchus or lung: Secondary | ICD-10-CM | POA: Insufficient documentation

## 2023-05-30 ENCOUNTER — Inpatient Hospital Stay: Payer: Medicare HMO | Admitting: Internal Medicine

## 2023-05-30 VITALS — BP 139/74 | HR 97 | Temp 98.4°F | Resp 17 | Ht 63.0 in | Wt 135.0 lb

## 2023-05-30 DIAGNOSIS — C3491 Malignant neoplasm of unspecified part of right bronchus or lung: Secondary | ICD-10-CM

## 2023-05-30 DIAGNOSIS — C3411 Malignant neoplasm of upper lobe, right bronchus or lung: Secondary | ICD-10-CM | POA: Diagnosis not present

## 2023-05-30 NOTE — Progress Notes (Signed)
[facility] [phone]   [phone]  OFFICE PROGRESS NOTE  Leilani Able, MD [address] [place] [address]  DIAGNOSIS: stage Ia (T1b, N0, M0) non-small cell lung cancer, adenocarcinoma of the right upper lobe as well as stage Ia (T1b, N0, M0) non-small cell lung cancer, adenocarcinoma involving the right lower lobe  Molecular studies by foundation 1 showed no evidence for actionable mutations and PD-L1 expression was 0%  PRIOR THERAPY: Status post wedge resection of the right upper and lower lobes with lymph node dissection under the care of Dr. Dorris Fetch on Oct 25, 2022.  CURRENT THERAPY: Observation.  INTERVAL HISTORY: Audrey Green 77 y.o. female returns to the clinic today for 35-month follow-up visit.Discussed the use of AI scribe software for clinical note transcription with the patient, who gave verbal consent to proceed.  History of Present Illness   Audrey Green, a 77 year old individual, was diagnosed with stage 1A non-small cell lung cancer in May 2024. She had two tumors, one in the right upper lobe and one in the right lower lobe. On Oct 25, 2022, she underwent a wedge resection of each tumor. Since the surgery, she has been generally well, with the exception of occasional soreness on the right side, particularly during weather changes, presumably related to the surgical scars.  Her breathing has been satisfactory, with oxygen saturation levels consistently between 96 and 100%. She denies any cough, hemoptysis, nausea, vomiting, diarrhea, headaches, or changes in vision. Her blood sugar levels have been decreasing, suggesting improved glycemic control.  A recent scan was performed to monitor her lung condition. The results of this scan were pending at the time of the conversation.       MEDICAL HISTORY: Past Medical History:  Diagnosis Date   A-fib [facility])    Allergy    Aortic atherosclerosis (HCC)    Arthritis    Chronic  kidney disease    stage 3   Complication of anesthesia    Diabetes mellitus    Dyspnea    with exertion   Fibroids    GERD (gastroesophageal reflux disease)    Goiter    Heart murmur    Hepatitis    from blood transfusion, was treated in 2014   History of blood transfusion    History of colon polyps    Hyperlipidemia    Hypertension    Osteoporosis    Palpitations    Pancreatitis    PONV (postoperative nausea and vomiting)    years ago   Pulmonary emphysema (HCC)    Seasonal allergies     ALLERGIES:  is allergic to atorvastatin, codeine, penicillins, and sulfa antibiotics.  MEDICATIONS:  Current Outpatient Medications  Medication Sig Dispense Refill   aspirin EC 81 MG tablet Take 81 mg by mouth daily.     cetirizine (ZYRTEC) 10 MG tablet Take 10 mg by mouth at bedtime.      Cholecalciferol (VITAMIN D-1000 MAX ST) 1000 units tablet Take 1,000 Units by mouth daily.     Continuous Glucose Sensor (DEXCOM G7 SENSOR) MISC Use 1 each every 10 (ten) days to check blood sugars 6 times per day     diltiazem (CARDIZEM CD) 120 MG 24 hr capsule Take 120 mg by mouth daily.     esomeprazole (NEXIUM) 40 MG capsule TAKE 1 CAPSULE (40MG  TOTAL ) BY MOUTH 2 TIMES DAILY 180 capsule 1   famotidine (PEPCID) 20 MG tablet Take 20 mg by mouth daily.  FARXIGA 10 MG TABS tablet Take 10 mg by mouth daily.     fenofibrate (TRICOR) 145 MG tablet TAKE 1 TABLET (145 MG) BY ORAL ROUTE ONCE DAILY  4   fluticasone (FLONASE) 50 MCG/ACT nasal spray Place 2 sprays into both nostrils daily as needed for allergies.     glucose blood (ONE TOUCH ULTRA TEST) test strip Check blood sugar three times daily and as directed. Dx 250.4 100 each 5   hydrOXYzine (ATARAX/VISTARIL) 25 MG tablet Take 25 mg by mouth daily.     insulin aspart (NOVOLOG FLEXPEN) 100 UNIT/ML FlexPen Inject 10-15 Units into the skin 3 (three) times daily before meals.     Insulin Pen Needle (ULTICARE MINI PEN NEEDLES) 31G X 6 MM MISC Use to inject  insulin daily 200 each 1   isosorbide mononitrate (IMDUR) 30 MG 24 hr tablet Take 30 mg by mouth daily.  3   lisinopril-hydrochlorothiazide (PRINZIDE,[name]) 20-25 MG tablet Take 1 tablet by mouth daily.  3   Multiple Vitamin (MULITIVITAMIN WITH MINERALS) TABS Take 1 tablet by mouth daily. Centrum Silver     ONE TOUCH LANCETS MISC Use to check blood sugar twice a day     OZEMPIC, 1 MG/DOSE, 4 MG/3ML SOPN Inject 1 mg into the skin every Saturday.     pantoprazole (PROTONIX) 40 MG tablet Take 40 mg by mouth 2 (two) times daily.     pravastatin (PRAVACHOL) 20 MG tablet Take 20 mg by mouth at bedtime.     TRESIBA FLEXTOUCH 200 UNIT/ML SOPN Inject 44 Units into the skin at bedtime.  3   vitamin B-12 (CYANOCOBALAMIN) 100 MCG tablet Take 100 mcg by mouth daily.     vitamin E 45 MG (100 UNITS) capsule Take by mouth daily.     No current facility-administered medications for this visit.    SURGICAL HISTORY:  Past Surgical History:  Procedure Laterality Date   ABDOMINAL HYSTERECTOMY  06/12/1975   CARPAL TUNNEL RELEASE Bilateral    CHOLECYSTECTOMY     COLONOSCOPY W/ POLYPECTOMY     INTERCOSTAL NERVE BLOCK  10/25/2022   Procedure: INTERCOSTAL NERVE BLOCK;  Surgeon: Loreli Slot, MD;  Location: [facility];  Service: Thoracic;;   LUMBAR LAMINECTOMY/DECOMPRESSION MICRODISCECTOMY Left 06/24/2014   Procedure: Left Lumbar four-five microdiskectomy;  Surgeon: Coletta Memos, MD;  Location: [facility];  Service: Neurosurgery;  Laterality: Left;  Left Lumbar four-five microdiskectomy   LYMPH NODE BIOPSY N/A 10/25/2022   Procedure: LYMPH NODE BIOPSY;  Surgeon: Loreli Slot, MD;  Location: [facility] OR;  Service: Thoracic;  Laterality: N/A;   TONSILLECTOMY     VIDEO BRONCHOSCOPY WITH ENDOBRONCHIAL NAVIGATION N/A 10/25/2022   Procedure: VIDEO BRONCHOSCOPY WITH ENDOBRONCHIAL NAVIGATION;  Surgeon: Loreli Slot, MD;  Location: [facility] OR;  Service: Thoracic;  Laterality: N/A;    REVIEW OF SYSTEMS:   A comprehensive review of systems was negative except for: Constitutional: positive for fatigue   PHYSICAL EXAMINATION: General appearance: alert, cooperative, and no distress Head: Normocephalic, without obvious abnormality, atraumatic Neck: no adenopathy, no JVD, supple, symmetrical, trachea midline, and thyroid not enlarged, symmetric, no tenderness/mass/nodules Lymph nodes: Cervical, supraclavicular, and axillary nodes normal. Resp: clear to auscultation bilaterally Back: symmetric, no curvature. ROM normal. No CVA tenderness. Cardio: regular rate and rhythm, S1, S2 normal, no murmur, click, rub or gallop GI: soft, non-tender; bowel sounds normal; no masses,  no organomegaly Extremities: extremities normal, atraumatic, no cyanosis or edema  ECOG PERFORMANCE STATUS: 0 - Asymptomatic  Blood pressure 139/74,  pulse 97, temperature 98.4 F (36.9 C), temperature source Temporal, resp. rate 17, height 5\' 3"  (1.6 m), weight 135 lb (61.2 kg), SpO2 100%.  LABORATORY DATA: Lab Results  Component Value Date   WBC 6.3 05/22/2023   HGB 12.0 05/22/2023   HCT 36.8 05/22/2023   MCV 87.2 05/22/2023   PLT 325 05/22/2023      Chemistry      Component Value Date/Time   NA 138 05/22/2023 1058   NA 135 (L) 08/06/2012 2043   K 4.9 05/22/2023 1058   K 3.7 08/06/2012 2043   CL 103 05/22/2023 1058   CL 102 08/06/2012 2043   CO2 28 05/22/2023 1058   CO2 26 08/06/2012 2043   BUN 37 (H) 05/22/2023 1058   BUN 21 (H) 08/06/2012 2043   CREATININE 2.33 (H) 05/22/2023 1058   CREATININE 1.36 (H) 08/06/2012 2043   CREATININE 1.58 (H) 09/27/2011 1337      Component Value Date/Time   CALCIUM 9.4 05/22/2023 1058   CALCIUM 8.9 08/06/2012 2043   ALKPHOS 68 05/22/2023 1058   ALKPHOS 142 (H) 08/06/2012 2043   AST 14 (L) 05/22/2023 1058   ALT 13 05/22/2023 1058   ALT 29 08/06/2012 2043   BILITOT 0.4 05/22/2023 1058       RADIOGRAPHIC STUDIES: No results found.  ASSESSMENT AND PLAN: This is a very  pleasant 77 years old African-American female with stage Ia (T1b, N0, M0) non-small cell lung cancer, adenocarcinoma of the right upper lobe as well as stage Ia (T1b, N0, M0) non-small cell lung cancer, adenocarcinoma involving the right lower lobe.  She is status post wedge resection of the right upper and lower lobes with lymph node dissection under the care of Dr. Dorris Fetch on Oct 25, 2022. Molecular studies by foundation 1 showed no evidence for actionable mutations and PD-L1 expression was 0% She is currently on observation and the patient is feeling fine with no concerning complaints. She had repeat CT scan of the chest without contrast yesterday but the final report is still pending. I personally independently reviewed the scan images and I do not see any clear evidence for disease recurrence or metastasis but I will wait for the final report for confirmation.    Stage IA Non-Small Cell Lung Cancer: Status post wedge resection of right upper and lower lobe lesions in May 2014. No current respiratory symptoms. Recent chest CT scan performed, but results pending. -If CT scan results are unremarkable, follow-up in 6 months. -If CT scan results are concerning, will call patient to discuss.  Right-sided post-thoracotomy pain: Reports soreness on the right side with weather changes. No other associated symptoms. -Continue to monitor symptoms.  General Health Maintenance: -Continue current management of blood sugar levels.   The patient was advised to call immediately if she has any concerning symptoms in the interval. The patient voices understanding of current disease status and treatment options and is in agreement with the current care plan.  All questions were answered. The patient knows to call the clinic with any problems, questions or concerns. We can certainly see the patient much sooner if necessary.  The total time spent in the appointment was 20 minutes.  Disclaimer: This note was  dictated with voice recognition software. Similar sounding words can inadvertently be transcribed and may not be corrected upon review.

## 2023-09-20 ENCOUNTER — Other Ambulatory Visit: Payer: Self-pay | Admitting: Thoracic Surgery (Cardiothoracic Vascular Surgery)

## 2023-09-20 DIAGNOSIS — R918 Other nonspecific abnormal finding of lung field: Secondary | ICD-10-CM

## 2023-10-31 ENCOUNTER — Encounter: Payer: Self-pay | Admitting: Thoracic Surgery (Cardiothoracic Vascular Surgery)

## 2023-11-05 ENCOUNTER — Ambulatory Visit
Admission: RE | Admit: 2023-11-05 | Discharge: 2023-11-05 | Disposition: A | Discharge status: Home / Self Care | Source: Ambulatory Visit | Attending: Thoracic Surgery (Cardiothoracic Vascular Surgery) | Admitting: Thoracic Surgery (Cardiothoracic Vascular Surgery)

## 2023-11-05 ENCOUNTER — Ambulatory Visit: Admitting: Thoracic Surgery (Cardiothoracic Vascular Surgery)

## 2023-11-05 DIAGNOSIS — R918 Other nonspecific abnormal finding of lung field: Secondary | ICD-10-CM

## 2023-11-12 ENCOUNTER — Ambulatory Visit
Attending: Thoracic Surgery (Cardiothoracic Vascular Surgery) | Admitting: Thoracic Surgery (Cardiothoracic Vascular Surgery)

## 2023-11-12 VITALS — BP 148/69 | HR 62 | Resp 20 | Ht 63.0 in | Wt 139.3 lb

## 2023-11-12 DIAGNOSIS — C3491 Malignant neoplasm of unspecified part of right bronchus or lung: Secondary | ICD-10-CM

## 2023-11-12 DIAGNOSIS — R918 Other nonspecific abnormal finding of lung field: Secondary | ICD-10-CM

## 2023-11-12 NOTE — Progress Notes (Signed)
 [address]       [place] [address]             [phone]     HPI: Returns for a 1 year follow-up visit  Audrey Green is a 78 year old woman with a history of bilateral lung nodules, remote tobacco use, hypertension, hyperlipidemia, aortic atherosclerosis, heart murmur, insulin -dependent diabetes, chronic kidney disease, hepatitis, hepatic fibrosis, osteoporosis, and synchronous stage Ia adenocarcinomas of the lung.  She did have bilateral groundglass opacity of the lungs for many years.  In 2020 for one of the nodules showed progression.  She underwent wedge resection of right upper and lower lobe nodules on 10/25/2022.  Both were adenocarcinomas consistent with synchronous primary tumors.  She did not require any active treatment therapy.  Molecular testing did not show any actionable mutations.  She saw Dr. Marguerita Green in December.  CT showed a stable left upper lobe groundglass opacity.  Overall she is doing well.  She does feel some discomfort when it rains.  No respiratory issues.  Past Medical History:  Diagnosis Date   A-fib [facility])    Allergy    Aortic atherosclerosis (HCC)    Arthritis    Chronic kidney disease    stage 3   Complication of anesthesia    Diabetes mellitus    Dyspnea    with exertion   Fibroids    GERD (gastroesophageal reflux disease)    Goiter    Heart murmur    Hepatitis    from blood transfusion, was treated in 2014   History of blood transfusion    History of colon polyps    Hyperlipidemia    Hypertension    Osteoporosis    Palpitations    Pancreatitis    PONV (postoperative nausea and vomiting)    years ago   Pulmonary emphysema (HCC)    Seasonal allergies     Current Outpatient Medications  Medication Sig Dispense Refill   aspirin  EC 81 MG tablet Take 81 mg by mouth daily.     cetirizine (ZYRTEC) 10 MG tablet Take 10 mg by mouth at bedtime.      Cholecalciferol (VITAMIN D -1000 MAX ST) 1000 units tablet Take 1,000  Units by mouth daily.     Continuous Glucose Sensor (DEXCOM G7 SENSOR) MISC Use 1 each every 10 (ten) days to check blood sugars 6 times per day     diltiazem  (CARDIZEM  CD) 120 MG 24 hr capsule Take 120 mg by mouth daily.     esomeprazole  (NEXIUM ) 40 MG capsule TAKE 1 CAPSULE (40MG  TOTAL ) BY MOUTH 2 TIMES DAILY 180 capsule 1   FARXIGA  10 MG TABS tablet Take 10 mg by mouth daily.     fenofibrate  (TRICOR ) 145 MG tablet TAKE 1 TABLET (145 MG) BY ORAL ROUTE ONCE DAILY  4   fluticasone  (FLONASE ) 50 MCG/ACT nasal spray Place 2 sprays into both nostrils daily as needed for allergies.     glucose blood (ONE TOUCH ULTRA TEST) test strip Check blood sugar three times daily and as directed. Dx 250.4 100 each 5   hydrOXYzine  (ATARAX /VISTARIL ) 25 MG tablet Take 25 mg by mouth daily as needed.     insulin  aspart (NOVOLOG  FLEXPEN) 100 UNIT/ML FlexPen Inject 10-15 Units into the skin 3 (three) times daily before meals.     Insulin  Pen Needle (ULTICARE MINI PEN NEEDLES) 31G X 6 MM MISC Use to inject insulin  daily 200 each 1   isosorbide  mononitrate (IMDUR )  30 MG 24 hr tablet Take 30 mg by mouth daily.  3   lisinopril -hydrochlorothiazide  (PRINZIDE ,[name] ) 20-25 MG tablet Take 1 tablet by mouth daily.  3   Multiple Vitamin (MULITIVITAMIN WITH MINERALS) TABS Take 1 tablet by mouth daily. Centrum Silver     ONE TOUCH LANCETS MISC Use to check blood sugar twice a day     OZEMPIC, 1 MG/DOSE, 4 MG/3ML SOPN Inject 1 mg into the skin every Saturday.     pantoprazole  (PROTONIX ) 40 MG tablet Take 40 mg by mouth 2 (two) times daily.     pravastatin  (PRAVACHOL ) 20 MG tablet Take 20 mg by mouth at bedtime.     TRESIBA FLEXTOUCH 200 UNIT/ML SOPN Inject 44 Units into the skin at bedtime.  3   vitamin B-12 (CYANOCOBALAMIN) 100 MCG tablet Take 100 mcg by mouth daily.     vitamin E 45 MG (100 UNITS) capsule Take by mouth daily.     No current facility-administered medications for this visit.    Physical Exam BP (!)  148/69 (BP Location: Right Arm, Patient Position: Sitting, Cuff Size: Normal)   Pulse 62   Resp 20   Ht 5\' 3"  (1.6 m)   Wt 139 lb 4.8 oz (63.2 kg)   SpO2 99%   BMI 24.23 kg/m  79 year old woman in no acute distress Alert and oriented x 3 with no focal deficits Lungs slightly diminished at right base but otherwise clear Cardiac regular rate and rhythm No cervical supraclavicular adenopathy  Diagnostic Tests: CT CHEST WITHOUT CONTRAST   TECHNIQUE: Multidetector CT imaging of the chest was performed following the standard protocol without IV contrast.   RADIATION DOSE REDUCTION: This exam was performed according to the departmental dose-optimization program which includes automated exposure control, adjustment of the mA and/or kV according to patient size and/or use of iterative reconstruction technique.   COMPARISON:  05/29/2023   FINDINGS: Cardiovascular: Aortic atherosclerosis. 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 Normal heart size, without pericardial effusion. Left main and 3 vessel coronary artery calcification.   Mediastinum/Nodes: No mediastinal or hilar adenopathy, given limitations of unenhanced CT.   Lungs/Pleura: No pleural fluid. Right lower lobe wedge resection, without local recurrence.   Posterior right upper lobe wedge resection with mild similar surrounding soft tissue thickening including on 45/5.   Accessory left upper lobe fissure. Central left upper lobe 1.0 cm ground-glass nodule is unchanged on 24/5.   Minimal subpleural left lower lobe nodularity versus pleural thickening, similar on 98/5.   Upper Abdomen: Normal imaged portions of the liver, spleen, stomach, pancreas, adrenal glands. Mild renal cortical thinning bilaterally. Punctate right renal collecting system calculi.   Musculoskeletal: Moderate right hemidiaphragm elevation, presumably secondary to volume loss in the right hemithorax.    IMPRESSION: 1. Status post right upper and right lower lobe wedge resections, without recurrent or metastatic disease. 2. No change in a left upper lobe 10 mm ground-glass nodule. Recommend attention on follow-up. 3. Aortic atherosclerosis (ICD10-I70.0), coronary artery atherosclerosis and emphysema (ICD10-J43.9). 4. Right nephrolithiasis     Electronically Signed   By: Lore Rode M.D.   On: 11/05/2023 11:02 I personally reviewed the CT images.  Postoperative changes on right.  Stable left upper lobe groundglass opacity measuring 10 mm.  Coronary and aortic atherosclerosis.  Impression: Audrey Green is a 78 year old woman who had right upper lower lobe wedge resections for synchronous stage Ia adenocarcinomas of the lung a year ago.  Did not require adjuvant therapy.  Molecular  testing did not show any targetable mutations.  Doing well.  Still has some discomfort when it rains, but otherwise not having any significant pain.  Left upper lobe groundglass opacity-stable dating back years.  However is concerning for potential precancerous lesion.  Needs continued follow-up.  She can do that with Dr. Marguerita Green.  She sees him again in December.  Plan: Follow-up with Dr. Marguerita Green I will be happy to see Audrey Green back if I can be of any further assistance with her care, particularly there is any sign of progression of the left upper lobe lung nodule.  Zelphia Higashi, MD [facility] [phone]

## 2024-03-31 ENCOUNTER — Other Ambulatory Visit: Payer: Self-pay | Admitting: Internal Medicine

## 2024-03-31 DIAGNOSIS — E785 Hyperlipidemia, unspecified: Secondary | ICD-10-CM

## 2024-03-31 DIAGNOSIS — I2089 Other forms of angina pectoris: Secondary | ICD-10-CM

## 2024-04-17 ENCOUNTER — Ambulatory Visit
Admission: RE | Admit: 2024-04-17 | Discharge: 2024-04-17 | Disposition: A | Discharge status: Home / Self Care | Payer: Self-pay | Source: Ambulatory Visit | Attending: Internal Medicine | Admitting: Internal Medicine

## 2024-04-17 DIAGNOSIS — I2089 Other forms of angina pectoris: Secondary | ICD-10-CM | POA: Insufficient documentation

## 2024-04-17 DIAGNOSIS — E785 Hyperlipidemia, unspecified: Secondary | ICD-10-CM | POA: Insufficient documentation

## 2024-04-24 ENCOUNTER — Other Ambulatory Visit: Payer: Self-pay | Admitting: Internal Medicine

## 2024-04-24 DIAGNOSIS — I2089 Other forms of angina pectoris: Secondary | ICD-10-CM

## 2024-04-24 DIAGNOSIS — R0609 Other forms of dyspnea: Secondary | ICD-10-CM

## 2024-05-15 ENCOUNTER — Telehealth (HOSPITAL_COMMUNITY): Payer: Self-pay | Admitting: Emergency Medicine

## 2024-05-15 NOTE — Telephone Encounter (Signed)
 Reaching out to patient to offer assistance regarding upcoming cardiac imaging study; pt verbalizes understanding of appt date/time, parking situation and where to check in, pre-test NPO status and medications ordered, and verified current allergies; name and call back number provided for further questions should they arise Rockwell Alexandria RN Navigator Cardiac Imaging [facility] [phone] office [phone] cell

## 2024-05-18 ENCOUNTER — Ambulatory Visit
Admission: RE | Admit: 2024-05-18 | Discharge: 2024-05-18 | Disposition: A | Source: Ambulatory Visit | Attending: Internal Medicine | Admitting: Internal Medicine

## 2024-05-18 DIAGNOSIS — R0609 Other forms of dyspnea: Secondary | ICD-10-CM

## 2024-05-18 DIAGNOSIS — I2089 Other forms of angina pectoris: Secondary | ICD-10-CM

## 2024-05-18 LAB — POCT I-STAT CREATININE: Creatinine, Ser: 2.2 mg/dL — ABNORMAL HIGH (ref 0.44–1.00)

## 2024-05-18 MED ORDER — DILTIAZEM HCL 25 MG/5ML IV SOLN
10.0000 mg | INTRAVENOUS | Status: DC | PRN
  Filled 2024-05-18: qty 5

## 2024-05-18 MED ORDER — IOHEXOL 350 MG/ML SOLN: 100.0000 mL | Freq: Once | INTRAVENOUS | Status: DC | PRN

## 2024-05-18 MED ORDER — METOPROLOL TARTRATE 5 MG/5ML IV SOLN
10.0000 mg | INTRAVENOUS | Status: DC | PRN
  Filled 2024-05-18: qty 10

## 2024-05-18 MED ORDER — NITROGLYCERIN 0.4 MG SL SUBL
0.8000 mg | SUBLINGUAL_TABLET | Freq: Once | SUBLINGUAL | Status: DC
  Filled 2024-05-18: qty 25

## 2024-05-26 NOTE — Progress Notes (Addendum)
 Established Patient Visit   Chief Complaint: Chief Complaint  Patient presents with   Follow-up    Follow up to discuss not being able to do CT due to elevated HR   Date of Service: 05/22/2024 Date of Birth: Apr 09, 1946 PCP: Ilah Arthur BIRCH, MD  History of Present Illness: Audrey Green is a 78 y.o.female patient who recently has been having anginal symptoms shortness of breath dyspnea pressure sensation in her chest area patient underwent calcium score which was elevated over 1300 she was unable to get a cardiac CTA because of her renal insufficiency and symptoms of continued now the patient is prepared for cardiac cath with the idea of limiting her contrast load.  Has a history of emphysema COPD requiring inhalers chronic insufficiency followed by nephrology GFR of around 30.  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 Followed by GI in [place]   HTN (hypertension)    Lung cancer (CMS/HHS-HCC) 10/25/2022   Adenocarcinoma (two primaries bilat) s/p resection   Multinodular goiter    02/2015 - biopsy of two left-sided nodules with Dr. Lazaro (ENT in [place]) was benign   Osteopenia    Type 2 diabetes mellitus (CMS/HHS-HCC)     Past Surgical History She has a past surgical history that includes cholecystectomy; Hysterectomy Vaginal; Tonsillectomy; colonoscopy (11/24/2012); BACK SURGERY; Hysterectomy; Cholecystectomy; Tonsillectomy; Colonoscopy (06/14/2020); and thoracic surgery (10/25/2022).   Medications and Allergies  Current  Medications  Current Outpatient Medications  Medication Sig Dispense Refill   ACETAMINOPHEN  (TYLENOL  ORAL) Take by mouth as needed.       aspirin  81 MG EC tablet Take 1 tablet (81 mg total) by mouth once daily 90 tablet 3   cetirizine (ZYRTEC) 10 MG tablet Take 10 mg by mouth once daily.     cholecalciferol (VITAMIN D3) 1000 unit tablet Take 1,000 Units by mouth once daily        DEXCOM G7 SENSOR Devi Use 1 each every 10 (ten) days to check blood sugars 6 times per day 9 each 3   dilTIAZem  (CARDIZEM  CD) 120 MG XR capsule Take 1 capsule (120 mg total) by mouth once daily 90 capsule 3   fluticasone  (FLONASE ) 50 mcg/actuation nasal spray Place 2 sprays into both nostrils once daily. 48 g 3   FOLIC ACID/MULTIVIT-MIN/LUTEIN (CENTRUM SILVER ORAL) Take 1 tablet by mouth once daily. (Patient not taking: Reported on 05/26/2024)     Herbal Supplement Herbal Name: Nutrifol     hydrOXYzine  pamoate (VISTARIL ) 25 MG capsule Take 25 mg by mouth 4 (four) times daily as needed     insulin  DEGLUDEC (TRESIBA FLEXTOUCH U-200) pen injector (concentration 200 units/mL) Inject 40 Units subcutaneously at bedtime 24 mL 3   isosorbide  mononitrate (IMDUR ) 30 MG ER tablet Take 1 tablet (30 mg total) by mouth once daily 90 tablet 3   Lactobacillus acidophilus (PROBIOTIC ORAL) Take by mouth.     lisinopriL -hydrochlorothiazide  ([name] ) 20-25 mg tablet Take 0.5 tablets by mouth once daily 90 tablet 3   metoprolol  TARTrate (LOPRESSOR ) 25 MG tablet  Take one tablet ( 25 mh) by mouth TWO HOURS prior to cardiac CT Scan (Patient not taking: Reported on 05/26/2024) 1 tablet 0   multivitamin with minerals (HAIR,SKIN AND NAILS ORAL) Take by mouth     NOVOLOG  FLEXPEN U-100 INSULIN  pen injector (concentration 100 units/mL) Inject 15 Units subcutaneously 3 (three) times daily with meals 45 mL 3   pantoprazole  (PROTONIX ) 40 MG DR tablet TAKE 1 TABLET BY MOUTH TWICE A DAY 180 tablet 3   pravastatin  (PRAVACHOL ) 20 MG  tablet Take 1 tablet (20 mg total) by mouth at bedtime 90 tablet 3   semaglutide (OZEMPIC) 2 mg/dose (8 mg/3 mL) pen injector Inject 0.75 mLs (2 mg total) subcutaneously once a week 3 mL 5   baclofen (LIORESAL) 10 MG tablet 1/2 PO BID PRN 10 tablet 0   FARXIGA  10 mg tablet TAKE 1 TABLET BY MOUTH EVERY DAY 90 tablet 3   pen needle, diabetic 32 gauge x 5/16 needle Use as directed 4 times daily 400 each 3   predniSONE (DELTASONE) 10 MG tablet Take 1 tablet (10 mg total) by mouth 2 (two) times daily for 5 days 10 tablet 0   No current facility-administered medications for this visit.    Allergies: Codeine, Lipitor [atorvastatin], Penicillins, Pravastatin , and Sulfa (sulfonamide antibiotics)  Social and Family History  Social History  reports that she quit smoking about 12 years ago. Her smoking use included cigarettes. She started smoking about 27 years ago. She has a 11.3 pack-year smoking history. She has never used smokeless tobacco. She reports that she does not drink alcohol and does not use drugs.  Family History Family History  Problem Relation Name Age of Onset   Myocardial Infarction (Heart attack) Father     Diabetes Mother     Breast cancer Mother     Diabetes Sister     Stroke Brother      Review of Systems   Review of Systems: The patient denies chest pain, shortness of breath, orthopnea, paroxysmal nocturnal dyspnea, pedal edema, palpitations, heart racing, presyncope, syncope. Review of 12 Systems is negative except as described above.  Physical Examination   Vitals:BP 122/80   Pulse 69   Ht 160 cm (5' 3)   Wt 58.5 kg (129 lb)   SpO2 99%   BMI 22.85 kg/m  Ht:160 cm (5' 3) Wt:58.5 kg (129 lb) ADJ:Anib surface area is 1.61 meters squared. Body mass index is 22.85 kg/m.  HEENT: Pupils equally reactive to light and accomodation  Neck: Supple without thyromegaly, carotid pulses 2+ Lungs: clear to auscultation bilaterally; no wheezes, rales,  rhonchi Heart: Regular rate and rhythm.  No gallops, murmurs or rub Abdomen: soft nontender, nondistended, with normal bowel sounds Extremities: no cyanosis, clubbing, or edema Peripheral Pulses: 2+ in all extremities, 2+ femoral pulses bilaterally Neurologic: Alert and oriented X3; speech intact; face symmetrical; moves all extremities well  Assessment   78 y.o. female with  1. Anginal syndrome   2. Abnormal CT scan of heart   3. DOE (dyspnea on exertion)   4. Hyperlipidemia, unspecified hyperlipidemia type   5. CKD (chronic kidney disease) stage 4, GFR 15-29 ml/min (CMS/HHS-HCC)   6. Gastroesophageal reflux disease, unspecified whether esophagitis present   7. Diabetes mellitus type 2, insulin  dependent (CMS/HHS-HCC)   8. Heart palpitations   9. Essential hypertension   10. Pulmonary hypertension (CMS/HHS-HCC)        Plan  Angina recommend further evaluation with elevated cardiac calcium score would recommend cardiac cath  Dyspnea on exertion recommend evaluation for possible coronary artery disease Renal insufficiency would recommend follow-up with nephrology prior to cardiac cath Recommend left heart cath with coronaries possible PCI and stent for anginal symptoms and coronary artery disease Diabetes type 2 continue conservative therapy currently on Tresiba NovoLog  Ozempic Farxiga  Hypertension poor control blood pressure 150/85 recommend less than 130/80 currently on diltiazem  Imdur  lisinopril  HCTZ and metoprolol  Hyperlipidemia continue Pravachol  therapy for lipid management will consider advancing to adding Zetia and/or PCSK9 GERD patient currently maintained on Protonix  therapy for reflux type symptoms Have the patient follow-up after cardiac      Return in about 1 year (around 05/22/2025).  DWAYNE D CALLWOOD, MD  This dictation was prepared with dragon dictation.  Any transcription errors that result from this process are unintentional.

## 2024-06-01 ENCOUNTER — Other Ambulatory Visit: Payer: Self-pay

## 2024-06-01 ENCOUNTER — Encounter: Admission: RE | Disposition: A | Payer: Self-pay | Source: Home / Self Care | Attending: Internal Medicine

## 2024-06-01 ENCOUNTER — Encounter: Payer: Self-pay | Admitting: Internal Medicine

## 2024-06-01 ENCOUNTER — Ambulatory Visit
Admission: RE | Admit: 2024-06-01 | Discharge: 2024-06-01 | Disposition: A | Discharge status: Home / Self Care | Attending: Internal Medicine | Admitting: Internal Medicine

## 2024-06-01 DIAGNOSIS — Z7984 Long term (current) use of oral hypoglycemic drugs: Secondary | ICD-10-CM | POA: Insufficient documentation

## 2024-06-01 DIAGNOSIS — N289 Disorder of kidney and ureter, unspecified: Secondary | ICD-10-CM | POA: Diagnosis not present

## 2024-06-01 DIAGNOSIS — Z794 Long term (current) use of insulin: Secondary | ICD-10-CM | POA: Diagnosis not present

## 2024-06-01 DIAGNOSIS — E1122 Type 2 diabetes mellitus with diabetic chronic kidney disease: Secondary | ICD-10-CM | POA: Diagnosis not present

## 2024-06-01 DIAGNOSIS — I129 Hypertensive chronic kidney disease with stage 1 through stage 4 chronic kidney disease, or unspecified chronic kidney disease: Secondary | ICD-10-CM | POA: Diagnosis not present

## 2024-06-01 DIAGNOSIS — I25110 Atherosclerotic heart disease of native coronary artery with unstable angina pectoris: Secondary | ICD-10-CM | POA: Insufficient documentation

## 2024-06-01 DIAGNOSIS — I200 Unstable angina: Secondary | ICD-10-CM | POA: Diagnosis present

## 2024-06-01 DIAGNOSIS — J439 Emphysema, unspecified: Secondary | ICD-10-CM | POA: Insufficient documentation

## 2024-06-01 DIAGNOSIS — Z87891 Personal history of nicotine dependence: Secondary | ICD-10-CM | POA: Diagnosis not present

## 2024-06-01 DIAGNOSIS — I2584 Coronary atherosclerosis due to calcified coronary lesion: Secondary | ICD-10-CM | POA: Diagnosis not present

## 2024-06-01 DIAGNOSIS — N184 Chronic kidney disease, stage 4 (severe): Secondary | ICD-10-CM | POA: Insufficient documentation

## 2024-06-01 HISTORY — PX: LEFT HEART CATH AND CORONARY ANGIOGRAPHY: CATH118249

## 2024-06-01 LAB — GLUCOSE, CAPILLARY: Glucose-Capillary: 87 mg/dL (ref 70–99)

## 2024-06-01 SURGERY — LEFT HEART CATH AND CORONARY ANGIOGRAPHY
Anesthesia: Moderate Sedation | Laterality: Left

## 2024-06-01 MED ORDER — MIDAZOLAM HCL (PF) 2 MG/2ML IJ SOLN
INTRAMUSCULAR | Status: DC | PRN
  Administered 2024-06-01: 1 mg via INTRAVENOUS

## 2024-06-01 MED ORDER — HYDRALAZINE HCL 20 MG/ML IJ SOLN: 10.0000 mg | INTRAMUSCULAR | Status: DC | PRN

## 2024-06-01 MED ORDER — LIDOCAINE HCL (PF) 1 % IJ SOLN
INTRAMUSCULAR | Status: DC | PRN
  Administered 2024-06-01: 2 mL

## 2024-06-01 MED ORDER — VERAPAMIL HCL 2.5 MG/ML IV SOLN
INTRAVENOUS | Status: DC | PRN
  Administered 2024-06-01: 2.5 mg via INTRA_ARTERIAL

## 2024-06-01 MED ORDER — HEPARIN (PORCINE) IN NACL 1000-0.9 UT/500ML-% IV SOLN
INTRAVENOUS | Status: AC
  Filled 2024-06-01: qty 1000

## 2024-06-01 MED ORDER — ONDANSETRON HCL 4 MG/2ML IJ SOLN: 4.0000 mg | Freq: Four times a day (QID) | INTRAMUSCULAR | Status: DC | PRN

## 2024-06-01 MED ORDER — VERAPAMIL HCL 2.5 MG/ML IV SOLN
INTRAVENOUS | Status: AC
  Filled 2024-06-01: qty 2

## 2024-06-01 MED ORDER — ACETAMINOPHEN 325 MG PO TABS: 650.0000 mg | ORAL_TABLET | ORAL | Status: DC | PRN

## 2024-06-01 MED ORDER — SODIUM CHLORIDE 0.9 % IV SOLN: INTRAVENOUS | Status: DC

## 2024-06-01 MED ORDER — SODIUM CHLORIDE 0.9% FLUSH: 3.0000 mL | INTRAVENOUS | Status: DC | PRN

## 2024-06-01 MED ORDER — ASPIRIN 81 MG PO CHEW: 81.0000 mg | CHEWABLE_TABLET | ORAL | Status: DC

## 2024-06-01 MED ORDER — HEPARIN (PORCINE) IN NACL 1000-0.9 UT/500ML-% IV SOLN
INTRAVENOUS | Status: DC | PRN
  Administered 2024-06-01 (×2): 500 mL

## 2024-06-01 MED ORDER — HEPARIN SODIUM (PORCINE) 1000 UNIT/ML IJ SOLN
INTRAMUSCULAR | Status: AC
  Filled 2024-06-01: qty 10

## 2024-06-01 MED ORDER — FENTANYL CITRATE (PF) 100 MCG/2ML IJ SOLN
INTRAMUSCULAR | Status: AC
  Filled 2024-06-01: qty 2

## 2024-06-01 MED ORDER — HEPARIN SODIUM (PORCINE) 1000 UNIT/ML IJ SOLN
INTRAMUSCULAR | Status: DC | PRN
  Administered 2024-06-01: 2500 [IU] via INTRAVENOUS

## 2024-06-01 MED ORDER — FENTANYL CITRATE (PF) 100 MCG/2ML IJ SOLN
INTRAMUSCULAR | Status: DC | PRN
  Administered 2024-06-01: 25 ug via INTRAVENOUS

## 2024-06-01 MED ORDER — SODIUM CHLORIDE 0.9% FLUSH: 3.0000 mL | Freq: Two times a day (BID) | INTRAVENOUS | Status: DC

## 2024-06-01 MED ORDER — SODIUM CHLORIDE 0.9 % IV SOLN: 250.0000 mL | INTRAVENOUS | Status: DC | PRN

## 2024-06-01 MED ORDER — LIDOCAINE HCL 1 % IJ SOLN
INTRAMUSCULAR | Status: AC
  Filled 2024-06-01: qty 20

## 2024-06-01 MED ORDER — MIDAZOLAM HCL 2 MG/2ML IJ SOLN
INTRAMUSCULAR | Status: AC
  Filled 2024-06-01: qty 2

## 2024-06-01 MED ORDER — IOHEXOL 300 MG/ML  SOLN
INTRAMUSCULAR | Status: DC | PRN
  Administered 2024-06-01: 61 mL

## 2024-06-01 SURGICAL SUPPLY — 8 items
CATH 5FR JL3.5 JR4 ANG PIG MP (CATHETERS) IMPLANT
DEVICE RAD TR BAND REGULAR (VASCULAR PRODUCTS) IMPLANT
DRAPE BRACHIAL (DRAPES) IMPLANT
GLIDESHEATH SLEND SS 6F .021 (SHEATH) IMPLANT
GUIDEWIRE INQWIRE 1.5J.035X260 (WIRE) IMPLANT
PACK CARDIAC CATH (CUSTOM PROCEDURE TRAY) ×1 IMPLANT
SET ATX-X65L (MISCELLANEOUS) IMPLANT
STATION PROTECTION PRESSURIZED (MISCELLANEOUS) IMPLANT

## 2024-06-02 ENCOUNTER — Encounter: Payer: Self-pay | Admitting: Internal Medicine

## 2024-06-24 ENCOUNTER — Telehealth (HOSPITAL_COMMUNITY): Payer: Self-pay

## 2024-06-24 NOTE — Telephone Encounter (Signed)
 Attempted to call patient in regards to Cardiac Rehab - [facility]

## 2024-07-08 ENCOUNTER — Telehealth (HOSPITAL_COMMUNITY): Payer: Self-pay

## 2024-07-08 NOTE — Telephone Encounter (Signed)
 F/u call regarding cardiac rehab. Patient confirmed interest though is unsure of her transportation. Stated her sister will need to drive her, she would only be able to attend 2x days per week on Mondays and Fridays. Informed her of referral process.  Passing to nurse navigator for review.

## 2024-07-09 ENCOUNTER — Telehealth (HOSPITAL_COMMUNITY): Payer: Self-pay

## 2024-07-09 NOTE — Telephone Encounter (Signed)
 F/u call regarding cardiac rehab, patient confirmed interest. Faxing request for EKG to Dr. Redell office ([facility]).  Patient f/u appt on 2/03 @ 11:00 with Dr. Florencio ([facility]).
